# Patient Record
Sex: Female | Born: 1965
Health system: Southern US, Community
[De-identification: ages and names within clinical notes are randomized; demographics above are authoritative.]

## PROBLEM LIST (undated history)

## (undated) DIAGNOSIS — R87629 Unspecified abnormal cytological findings in specimens from vagina: Secondary | ICD-10-CM

## (undated) DIAGNOSIS — K219 Gastro-esophageal reflux disease without esophagitis: Secondary | ICD-10-CM

## (undated) DIAGNOSIS — J45909 Unspecified asthma, uncomplicated: Secondary | ICD-10-CM

## (undated) DIAGNOSIS — D649 Anemia, unspecified: Secondary | ICD-10-CM

## (undated) DIAGNOSIS — F419 Anxiety disorder, unspecified: Secondary | ICD-10-CM

## (undated) DIAGNOSIS — F99 Mental disorder, not otherwise specified: Secondary | ICD-10-CM

## (undated) DIAGNOSIS — D219 Benign neoplasm of connective and other soft tissue, unspecified: Secondary | ICD-10-CM

## (undated) HISTORY — DX: Mental disorder, not otherwise specified: F99

## (undated) HISTORY — DX: Anxiety disorder, unspecified: F41.9

## (undated) HISTORY — PX: TUBAL LIGATION: SHX77

## (undated) HISTORY — PX: BLADDER SUSPENSION: SHX72

## (undated) HISTORY — DX: Benign neoplasm of connective and other soft tissue, unspecified: D21.9

## (undated) HISTORY — DX: Unspecified abnormal cytological findings in specimens from vagina: R87.629

## (undated) HISTORY — PX: DILATION AND CURETTAGE OF UTERUS: SHX78

## (undated) HISTORY — PX: CHOLECYSTECTOMY: SHX55

## (undated) HISTORY — PX: TONSILLECTOMY AND ADENOIDECTOMY: SUR1326

## (undated) HISTORY — DX: Anemia, unspecified: D64.9

## (undated) HISTORY — PX: OTHER SURGICAL HISTORY: SHX169

## (undated) HISTORY — PX: WISDOM TOOTH EXTRACTION: SHX21

---

## 2009-11-19 ENCOUNTER — Emergency Department (HOSPITAL_BASED_OUTPATIENT_CLINIC_OR_DEPARTMENT_OTHER): Admission: EM | Admit: 2009-11-19 | Discharge: 2009-11-20 | Payer: Self-pay | Admitting: Emergency Medicine

## 2010-12-24 LAB — URINALYSIS, ROUTINE W REFLEX MICROSCOPIC
Glucose, UA: NEGATIVE mg/dL
Nitrite: NEGATIVE
pH: 6 (ref 5.0–8.0)

## 2011-06-11 ENCOUNTER — Emergency Department (HOSPITAL_BASED_OUTPATIENT_CLINIC_OR_DEPARTMENT_OTHER)
Admission: EM | Admit: 2011-06-11 | Discharge: 2011-06-12 | Disposition: A | Discharge status: Home / Self Care | Payer: Managed Care, Other (non HMO) | Attending: Emergency Medicine | Admitting: Emergency Medicine

## 2011-06-11 ENCOUNTER — Emergency Department (INDEPENDENT_AMBULATORY_CARE_PROVIDER_SITE_OTHER): Payer: Managed Care, Other (non HMO)

## 2011-06-11 ENCOUNTER — Encounter: Payer: Self-pay | Admitting: Emergency Medicine

## 2011-06-11 DIAGNOSIS — IMO0002 Reserved for concepts with insufficient information to code with codable children: Secondary | ICD-10-CM

## 2011-06-11 DIAGNOSIS — S6990XA Unspecified injury of unspecified wrist, hand and finger(s), initial encounter: Secondary | ICD-10-CM

## 2011-06-11 DIAGNOSIS — S6710XA Crushing injury of unspecified finger(s), initial encounter: Secondary | ICD-10-CM | POA: Insufficient documentation

## 2011-06-11 DIAGNOSIS — S61209A Unspecified open wound of unspecified finger without damage to nail, initial encounter: Secondary | ICD-10-CM

## 2011-06-11 DIAGNOSIS — W230XXA Caught, crushed, jammed, or pinched between moving objects, initial encounter: Secondary | ICD-10-CM | POA: Insufficient documentation

## 2011-06-11 NOTE — ED Notes (Signed)
Pt slammed right middle finger in car door

## 2011-06-12 MED ORDER — DOXYCYCLINE HYCLATE 100 MG PO CAPS: 100.0000 mg | ORAL_CAPSULE | Freq: Two times a day (BID) | ORAL | Status: DC

## 2011-06-12 MED ORDER — DOXYCYCLINE HYCLATE 100 MG PO CAPS: 100.0000 mg | ORAL_CAPSULE | Freq: Two times a day (BID) | ORAL | Status: AC

## 2011-06-12 NOTE — ED Provider Notes (Signed)
History     CSN: [id number] Arrival date & time: 06/11/2011 10:58 PM  Chief Complaint  Patient presents with  . Finger Injury   Patient is a 45 y.o. female presenting with hand pain. The history is provided by the patient. No language interpreter was used.  Hand Pain This is a new problem. The current episode started 1 to 2 hours ago. The problem has not changed since onset.Pertinent negatives include no chest pain, no abdominal pain, no headaches and no shortness of breath. Exacerbated by: slammed right middle finger in the car door. She has tried nothing for the symptoms. The treatment provided no relief.    History reviewed. No pertinent past medical history.  Past Surgical History  Procedure Date  . Cholecystectomy   . Bladder suspension   . Tonsillectomy and adenoidectomy     No family history on file.  History  Substance Use Topics  . Smoking status: Never Smoker   . Smokeless tobacco: Not on file  . Alcohol Use: No    OB History    Grav Para Term Preterm Abortions TAB SAB Ect Mult Living                  Review of Systems  Constitutional: Negative for activity change.  HENT: Negative for facial swelling.   Eyes: Negative for discharge.  Respiratory: Negative for shortness of breath.   Cardiovascular: Negative for chest pain.  Gastrointestinal: Negative for abdominal pain and abdominal distention.  Genitourinary: Negative for difficulty urinating.  Musculoskeletal: Positive for arthralgias.  Skin: Negative.   Neurological: Negative for headaches.  Hematological: Negative.   Psychiatric/Behavioral: Negative.     Physical Exam  BP 112/74  Pulse 100  Temp(Src) 98.2 F (36.8 C) (Oral)  Resp 18  SpO2 99%  Physical Exam  Constitutional: She is oriented to person, place, and time. She appears well-developed and well-nourished.  HENT:  Head: Normocephalic and atraumatic.  Eyes: EOM are normal. Pupils are equal, round, and reactive to light.  Neck: Normal  range of motion. Neck supple.  Cardiovascular: Normal rate and regular rhythm.   Pulmonary/Chest: Effort normal and breath sounds normal.  Abdominal: Soft. Bowel sounds are normal. She exhibits no distension.  Musculoskeletal: Normal range of motion.       Right middle finger cap refill < 2 sec superficial horizontal laceration lateral to the fingernail  Neurological: She is alert and oriented to person, place, and time.  Skin: Skin is warm and dry.  Psychiatric: She has a normal mood and affect.    ED Course  Procedures  MDM  Take all antibiotics, follow up with hand surgery.  Patient verbalizes understanding and agrees to follow up     Murrel Freet Smitty Cords, MD 06/12/11 [id number]

## 2012-01-08 ENCOUNTER — Emergency Department
Admission: EM | Admit: 2012-01-08 | Discharge: 2012-01-08 | Disposition: A | Discharge status: Home / Self Care | Payer: Managed Care, Other (non HMO) | Source: Home / Self Care | Attending: Family Medicine | Admitting: Family Medicine

## 2012-01-08 DIAGNOSIS — J0100 Acute maxillary sinusitis, unspecified: Secondary | ICD-10-CM

## 2012-01-08 DIAGNOSIS — M940 Chondrocostal junction syndrome [Tietze]: Secondary | ICD-10-CM

## 2012-01-08 DIAGNOSIS — H04309 Unspecified dacryocystitis of unspecified lacrimal passage: Secondary | ICD-10-CM

## 2012-01-08 DIAGNOSIS — J069 Acute upper respiratory infection, unspecified: Secondary | ICD-10-CM

## 2012-01-08 MED ORDER — BENZONATATE 200 MG PO CAPS: 200.0000 mg | ORAL_CAPSULE | Freq: Every day | ORAL | Status: AC

## 2012-01-08 MED ORDER — TOBRAMYCIN 0.3 % OP SOLN: 1.0000 [drp] | OPHTHALMIC | Status: AC

## 2012-01-08 MED ORDER — AMOXICILLIN 875 MG PO TABS: 875.0000 mg | ORAL_TABLET | Freq: Two times a day (BID) | ORAL | Status: AC

## 2012-01-08 NOTE — Discharge Instructions (Signed)
Take Mucinex D (guaifenesin with decongestant) twice daily for congestion.  Increase fluid intake, rest. May use Afrin nasal spray (or generic oxymetazoline) twice daily for about 5 days.  Also recommend using saline nasal spray several times daily and saline nasal irrigation (AYR is a common brand) Stop all antihistamines for now, and other non-prescription cough/cold preparations. May take Ibuprofen 200mg , 4 tabs every 8 hours with food for chest/sternum discomfort.  Dacryocystitis  Dacryocystitis is an infection of the tear sac. The tear sac lies between the inner corner of the eyelids and the nose. The glands of the eyelids always produce tears. This is to keep the surface of the eye wet and protect it. These tears drain from the surface of the eyes through a duct in each lid (lacrimal ducts), then through the tear sac (lacrimal sac) into the nose. The tears are then swallowed. If the tear ducts become blocked, bacteria begin to buildup. The tear sac can become infected. Dacryocystitis may be sudden (acute) or long-lasting (chronic). This problem is most common in infants because the tear ducts are not fully developed and clog easily. In that case, babies may have episodes of tearing and infection. However, in most cases, the problem gets better as the child grows. CAUSES   Malformation of the tear duct.   Injury.   Eye infection.   Trauma.   Injury or inflammation of the nasal passages.  The cause is often unknown. SYMPTOMS   Usually only 1 eye is involved.   Tearing and discharge from the involved eye.   Tenderness, redness and swelling of the lower lid near the nose.   A sore, red, inflamed bump on the inner corner of the lower lid.  DIAGNOSIS  A diagnosis is made by:  An exam of the eye to find out how much blockage is present and if the surface of the eye is also infected.   Getting cultures to see if a specific infection is present.  TREATMENT  Treatment depends on:    The person's age.   Whether or not the infection is chronic or acute.   Amount of blockage is present.  Additional treatment  In infants, sometimes massaging the area (starting from the inside of the eye and gently massaging down toward the nose) will improve the condition, combined with antibiotic eye drops or ointments.   If the above does not work, it may be necessary to probe the ducts and open up the drainage system. While this is easily done in the office in adults, probing usually has to be done under general anesthesia in infants.   If the blockage cannot be cleared by probing, surgery may be needed under general anesthesia to create a direct opening for tears to flow between the tear sac and the inside of the nose (dacryocystorhinostomy or DCR).  HOME CARE INSTRUCTIONS   Antibiotic drops or ointment may be prescribed after probing procedures.   If surgery has been performed, oral antibiotics may also be prescribed.   Finish all medications as prescribed, even if your symptoms get better.  SEEK MEDICAL CARE IF:  You develop tearing of 1 or both eyes and a sore, red, swollen lump between your lower eyelid and nose. This is especially true if you have these symptoms and a fever. SEEK IMMEDIATE MEDICAL CARE IF:   There is increased pain, swelling, redness or drainage from the eye.   There are signs of generalized infection including muscle aches, chills, fever or a general ill  feeling.   You or your child has an oral temperature above 102 F (38.9 C), not controlled by medicine.   Your baby is older than 3 months with a rectal temperature of 102 F (38.9 C) or higher.   Your baby is 64 months old or younger with a rectal temperature of 100.4 F (38 C) or higher.  MAKE SURE YOU:   Understand these instructions.   Will watch your condition.   Will get help right away if you are not doing well or get worse.  Document Released: 09/17/2000 Document Revised: 09/09/2011  Document Reviewed: 11/09/2007 [facility] Patient Information 2012 [place], [place].

## 2012-01-08 NOTE — ED Provider Notes (Signed)
History     CSN: [id number]  Arrival date & time 01/08/12  1144   First MD Initiated Contact with Patient 01/08/12 1232      Chief Complaint  Patient presents with  . Sore Throat      HPI Comments: Patient complains of onset of a mild sore throat about two weeks ago.  Then one week ago she developed sinus congestion.  About 4 days ago she developed a productive cough.  Yesterday she developed drainage from her left eye and increased left facial pressure.  The history is provided by the patient.    History reviewed. No pertinent past medical history.  Past Surgical History  Procedure Date  . Cholecystectomy   . Bladder suspension   . Tonsillectomy and adenoidectomy     Family History  Problem Relation Age of Onset  . Cancer Mother     History  Substance Use Topics  . Smoking status: Never Smoker   . Smokeless tobacco: Not on file  . Alcohol Use: No    OB History    Grav Para Term Preterm Abortions TAB SAB Ect Mult Living                  Review of Systems + sore throat + cough No pleuritic pain No wheezing + nasal congestion + post-nasal drainage + sinus pain/pressure + left irritated eye No earache No hemoptysis No SOB No  fever/chills No nausea No vomiting No abdominal pain No diarrhea No urinary symptoms No skin rashes + fatigue No myalgias No headache Used OTC meds without relief (Sudafed) Allergies  Codeine and Sulfa antibiotics  Home Medications   Current Outpatient Rx  Name Route Sig Dispense Refill  . AMOXICILLIN 875 MG PO TABS Oral Take 1 tablet (875 mg total) by mouth 2 (two) times daily. 20 tablet 0  . BENZONATATE 200 MG PO CAPS Oral Take 1 capsule (200 mg total) by mouth at bedtime. Take as needed for cough 12 capsule 0  . CLONAZEPAM 1 MG PO TABS Oral Take 1 mg by mouth 2 (two) times daily.      Marland Kitchen ONE-DAILY MULTI VITAMINS PO TABS Oral Take 1 tablet by mouth daily.      . SERTRALINE HCL 100 MG PO TABS Oral Take 100 mg by mouth daily.      . TOBRAMYCIN SULFATE 0.3 % OP SOLN Left Eye Place 1 drop into the left eye every 4 (four) hours. 5 mL 0    BP 107/73  Pulse 89  Temp(Src) 99 F (37.2 C) (Oral)  Resp 18  Ht 5\' 3"  (1.6 m)  Wt 179 lb 8 oz (81.421 kg)  BMI 31.80 kg/m2  Physical Exam Nursing notes and Vital Signs reviewed. Appearance:  Patient appears healthy, stated age, and in no acute distress Eyes:  Pupils are equal, round, and reactive to light and accomodation.  Extraocular movement is intact.  Right conjunctivae normal.  Left conjunctivae slightly injected.  There is mild tenderness beneath the left nasolacrimal duct.  Note small amount of mucopurulent drainage from left nasolacrimal duct. Ears:  Canals normal.  Right tympanic membrane scarred.  Left tympanic membrane is scarred with T-tube in place (no drainage from tube) Nose:  Mildly congested turbinates.   Maxillary sinus tenderness is present on the left.  Pharynx:  Normal Neck:  Supple.  Tender shotty anterior/posterior nodes are palpated bilaterally  Lungs:  Clear to auscultation.  Breath sounds are equal.  Chest:  Distinct tenderness to palpation over the mid-sternum.  Heart:  Regular rate and rhythm without murmurs,  rubs, or gallops.  Abdomen:  Nontender without masses or hepatosplenomegaly.  Bowel sounds are present.  No CVA or flank tenderness.  Extremities:  No edema.  No calf tenderness Skin:  No rash present.   ED Course  Procedures  none   Labs Reviewed  POCT RAPID STREP A (OFFICE) negative  STREP A DNA PROBE pending      1. Acute maxillary sinusitis   2. Dacrocystitis   3. Acute upper respiratory infections of  unspecified site   4. Costochondritis, acute       MDM  Begin amoxicillin for 10 days.  Tobrex eye drops to left eye.  Prescription written for Benzonatate [facility]) to take at bedtime for night-time cough.  Take Mucinex D (guaifenesin with decongestant) twice daily for congestion.  Increase fluid intake, rest. May use Afrin nasal spray (or generic oxymetazoline) twice daily for about 5 days.  Also recommend using saline nasal spray several times daily and saline nasal irrigation (AYR is a common brand) Stop all antihistamines for now, and other non-prescription cough/cold preparations. May take Ibuprofen 200mg , 4 tabs every 8 hours with food for chest/sternum discomfort. Followup with Family Doctor if not improved in about 5 days.        Lattie Haw, MD 01/08/12 [phone]

## 2012-01-08 NOTE — ED Notes (Signed)
Sore throat x 2 weeks, left eye drainage started last weekend and noted drainage on friday

## 2012-01-09 LAB — STREP A DNA PROBE: GASP: NEGATIVE

## 2012-01-10 ENCOUNTER — Telehealth: Payer: Self-pay | Admitting: Family Medicine

## 2012-03-15 ENCOUNTER — Ambulatory Visit (INDEPENDENT_AMBULATORY_CARE_PROVIDER_SITE_OTHER): Payer: Managed Care, Other (non HMO) | Admitting: Physician Assistant

## 2012-03-15 ENCOUNTER — Encounter: Payer: Self-pay | Admitting: Physician Assistant

## 2012-03-15 VITALS — BP 126/84 | HR 91 | Ht 63.0 in | Wt 179.0 lb

## 2012-03-15 DIAGNOSIS — R609 Edema, unspecified: Secondary | ICD-10-CM

## 2012-03-15 DIAGNOSIS — K219 Gastro-esophageal reflux disease without esophagitis: Secondary | ICD-10-CM | POA: Insufficient documentation

## 2012-03-15 DIAGNOSIS — F410 Panic disorder [episodic paroxysmal anxiety] without agoraphobia: Secondary | ICD-10-CM | POA: Insufficient documentation

## 2012-03-15 DIAGNOSIS — Z79899 Other long term (current) drug therapy: Secondary | ICD-10-CM

## 2012-03-15 DIAGNOSIS — M25473 Effusion, unspecified ankle: Secondary | ICD-10-CM

## 2012-03-15 DIAGNOSIS — F419 Anxiety disorder, unspecified: Secondary | ICD-10-CM | POA: Insufficient documentation

## 2012-03-15 DIAGNOSIS — F411 Generalized anxiety disorder: Secondary | ICD-10-CM

## 2012-03-15 MED ORDER — ESOMEPRAZOLE MAGNESIUM 40 MG PO PACK: 40.0000 mg | PACK | Freq: Every day | ORAL | Status: DC

## 2012-03-15 MED ORDER — HYDROCHLOROTHIAZIDE 12.5 MG PO CAPS: ORAL_CAPSULE | ORAL | Status: DC

## 2012-03-15 MED ORDER — SERTRALINE HCL 50 MG PO TABS: 50.0000 mg | ORAL_TABLET | Freq: Every day | ORAL | Status: DC

## 2012-03-15 NOTE — Progress Notes (Signed)
  Subjective:    Patient ID: Shirley Vasquez, female    DOB: 04-17-1966, 46 y.o.   MRN: [id number]  HPI Patient presents to the clinic to establish care and discuss bilateral ankle edema. PMH was reviewed and positive for GERD, anxiety, panic attacks. She is not currently on anything for GERD but has been on Nexium in the past. She wants to go back on it. In the past she has even had to have esophageal dilation. For anxiety she takes Zoloft 100mg  which has helped. Recently she had been having more panic attacks and went to urgent care they have her klonapin. That has really helped to put her at ease. On average she uses klonapin 2-3 times a week.   Recently, last 2 weeks, she has noticed bilateral edema in both ankles. It gets worse when she is on them all day. Swelling goes away with rest. She denies any pain but some tightness when they are really swollen. Not tried anything to make better.    Review of Systems     Objective:   Physical Exam  Constitutional: She is oriented to person, place, and time. She appears well-developed and well-nourished.       Overweight.  HENT:  Head: Normocephalic and atraumatic.  Neck: Normal range of motion. Neck supple. No thyromegaly present.  Cardiovascular: Normal rate, regular rhythm and normal heart sounds.   Pulmonary/Chest: Effort normal and breath sounds normal. She has no wheezes.  Neurological: She is alert and oriented to person, place, and time.  Skin: Skin is warm and dry.       Mild swelling 1+ pitting edema around both ankles. No skin changes, no ulceration or decomposition. Pedal pulses are 2+ bilaterally. No varicose veins.   Psychiatric: She has a normal mood and affect. Her behavior is normal.          Assessment & Plan:  GERD- Sent rx for nexium 40mg  daily to restart.   Anxiety/Panic attacks- GAD-7 was 6. Added a 50mg  tab of zoloft to 100mg  she is already taking. Will recheck in 6 weeks. Continue using Klonapin as needed for  attacks.   Bilateral ankle edema- Suspect a mild version of venous insuffiency. Gave symptomatic care ideas : frequent rest and feet elevation when going to be on them all day, get compression stockings to wear, Watch salt intake. Will check kidneys but did give HCTZ 12.5 to take only when needed for swelling. Reassured her the cause of swelling and that most likey the summer would make worse. Will recheck in 6 weeks. Will also check a TSH to make sure that is not contributing to the swelling.   Needs labs will get labs when schedule CPE.

## 2012-03-15 NOTE — Patient Instructions (Addendum)
Will get Tsh and CMP done today. Will call with results in 1-3 days. Will give HCTZ to take as needed for swelling of ankles. Gave Nexium for acid reflux. Increased Zoloft to 150mg  daily daily. Will recheck in 6 weeks for CPE.   Venous Stasis and Chronic Venous Insufficiency As people age, the veins located in their legs may weaken and stretch. When veins weaken and lose the ability to pump blood effectively, the condition is called chronic venous insufficiency (CVI) or venous stasis. Almost all veins return blood back to the heart. This happens by:  The force of the heart pumping fresh blood pushes blood back to the heart.   Blood flowing to the heart from the force of gravity.  In the deep veins of the legs, blood has to fight gravity and flow upstream back to the heart. Here, the leg muscles contract to pump blood back toward the heart. Vein walls are elastic, and many veins have small valves that only allow blood to flow in one direction. When leg muscles contract, they push inward against the elastic vein walls. This squeezes blood upward, opens the valves, and moves blood toward the heart. When leg muscles relax, the vein wall also relaxes and the valves inside the vein close to prevent blood from flowing backward. This method of pumping blood out of the legs is called the venous pump. CAUSES  The venous pump works best while walking and leg muscles are contracting. But when a person sits or stands, blood pressure in leg veins can build. Deep veins are usually able to withstand short periods of inactivity, but long periods of inactivity (and increased pressure) can stretch, weaken, and damage vein walls. High blood pressure can also stretch and damage vein walls. The veins may no longer be able to pump blood back to the heart. Venous hypertension (high blood pressure inside veins) that lasts over time is a primary cause of CVI. CVI can also be caused by:   Deep vein thrombosis, a condition where a  thrombus (blood clot) blocks blood flow in a vein.   Phlebitis, an inflammation of a superficial vein that causes a blood clot to form.  Other risk factors for CVI may include:   Heredity.   Obesity.   Pregnancy.   Sedentary lifestyle.   Smoking.   Jobs requiring long periods of standing or sitting in one place.   Age and gender:   Women in their 75's and 84's and men in their 39's are more prone to developing CVI.  SYMPTOMS  Symptoms of CVI may include:   Varicose veins.   Ulceration or skin breakdown.   Lipodermatosclerosis, a condition that affects the skin just above the ankle, usually on the inside surface. Over time the skin becomes brown, smooth, tight and often painful. Those with this condition have a high risk of developing skin ulcers.   Reddened or discolored skin on the leg.   Swelling.  DIAGNOSIS  Your caregiver can diagnose CVI after performing a careful medical history and physical examination. To confirm the diagnosis, the following tests may also be ordered:   Duplex ultrasound.   Plethysmography (tests blood flow).   Venograms (x-ray using a special dye).  TREATMENT The goals of treatment for CVI are to restore a person to an active life and to minimize pain or disability. Typically, CVI does not pose a serious threat to life or limb, and with proper treatment most people with this condition can continue to lead active  lives. In most cases, mild CVI can be treated on an outpatient basis with simple procedures. Treatment methods include:   Elastic compression socks.   Sclerotherapy, a procedure involving an injection of a material that "dissolves" the damaged veins. Other veins in the network of blood vessels take over the function of the damaged veins.   Vein stripping (an older procedure less commonly used).   Laser Ablation surgery.   Valve repair.  HOME CARE INSTRUCTIONS   Elastic compression socks must be worn every day. They can help with  symptoms and lower the chances of the problem getting worse, but they do not cure the problem.   Only take over-the-counter or prescription medicines for pain, discomfort, or fever as directed by your caregiver.   Your caregiver will review your other medications with you.  SEEK MEDICAL CARE IF:   You are confused about how to take your medications.   There is redness, swelling, or increasing pain in the affected area.   There is a red streak or line that extends up or down from the affected area.   There is a breakdown or loss of skin in the affected area, even if the breakdown is small.   You develop an unexplained oral temperature above 102 F (38.9 C).   There is an injury to the affected area.  SEEK IMMEDIATE MEDICAL CARE IF:   There is an injury and open wound to the affected area.   Pain is not adequately relieved with pain medication prescribed or becomes severe.   An oral temperature above 102 F (38.9 C) develops.   The foot/ankle below the affected area becomes suddenly numb or the area feels weak and hard to move.  MAKE SURE YOU:   Understand these instructions.   Will watch your condition.   Will get help right away if you are not doing well or get worse.  Document Released: 01/24/2007 Document Revised: 09/09/2011 Document Reviewed: 04/03/2007 [facility] Patient Information 2012 [place], [place].

## 2012-03-16 LAB — COMPLETE METABOLIC PANEL WITH GFR
AST: 16 U/L (ref 0–37)
Albumin: 4.4 g/dL (ref 3.5–5.2)
Alkaline Phosphatase: 53 U/L (ref 39–117)
BUN: 10 mg/dL (ref 6–23)
GFR, Est Non African American: 79 mL/min
Potassium: 4.4 mEq/L (ref 3.5–5.3)
Sodium: 136 mEq/L (ref 135–145)
Total Bilirubin: 0.6 mg/dL (ref 0.3–1.2)
Total Protein: 7 g/dL (ref 6.0–8.3)

## 2012-03-16 LAB — TSH: TSH: 2.265 u[IU]/mL (ref 0.350–4.500)

## 2012-03-17 ENCOUNTER — Telehealth: Payer: Self-pay | Admitting: *Deleted

## 2012-03-17 ENCOUNTER — Other Ambulatory Visit: Payer: Self-pay | Admitting: *Deleted

## 2012-03-17 MED ORDER — AMBULATORY NON FORMULARY MEDICATION: Status: DC

## 2012-03-17 NOTE — Telephone Encounter (Signed)
Pt is stating that around her rt ankle states it looks a little bruised and states her veins look more predominent. States it doesn't hurt. Please advise.

## 2012-03-17 NOTE — Telephone Encounter (Signed)
Accessed pt's legs while her to pickup rx for compression stockings. Pt has mild non-pitting edema. Encouraged pt to wear compression stockings and keep legs elevated as much as possible and low sodium diet. Coloration is mild pinkness around ankle and calf.

## 2012-03-17 NOTE — Telephone Encounter (Signed)
REcommend start the compression stocking, low salt diet.  Keep feet elevated.

## 2012-04-26 ENCOUNTER — Encounter: Payer: Managed Care, Other (non HMO) | Admitting: Physician Assistant

## 2012-06-09 ENCOUNTER — Ambulatory Visit (INDEPENDENT_AMBULATORY_CARE_PROVIDER_SITE_OTHER): Payer: Managed Care, Other (non HMO) | Admitting: Physician Assistant

## 2012-06-09 ENCOUNTER — Encounter: Payer: Self-pay | Admitting: Physician Assistant

## 2012-06-09 VITALS — BP 120/70 | HR 78 | Ht 63.0 in | Wt 177.0 lb

## 2012-06-09 DIAGNOSIS — H9202 Otalgia, left ear: Secondary | ICD-10-CM

## 2012-06-09 DIAGNOSIS — F410 Panic disorder [episodic paroxysmal anxiety] without agoraphobia: Secondary | ICD-10-CM

## 2012-06-09 DIAGNOSIS — Z13220 Encounter for screening for lipoid disorders: Secondary | ICD-10-CM

## 2012-06-09 DIAGNOSIS — H6990 Unspecified Eustachian tube disorder, unspecified ear: Secondary | ICD-10-CM

## 2012-06-09 DIAGNOSIS — Z0000 Encounter for general adult medical examination without abnormal findings: Secondary | ICD-10-CM

## 2012-06-09 DIAGNOSIS — F419 Anxiety disorder, unspecified: Secondary | ICD-10-CM

## 2012-06-09 LAB — LIPID PANEL
HDL: 52 mg/dL (ref >39)
Total CHOL/HDL Ratio: 4.2 Ratio
Triglycerides: 142 mg/dL (ref <150)

## 2012-06-09 MED ORDER — SERTRALINE HCL 100 MG PO TABS: ORAL_TABLET | ORAL | Status: DC

## 2012-06-09 MED ORDER — CLONAZEPAM 1 MG PO TABS: 1.0000 mg | ORAL_TABLET | Freq: Two times a day (BID) | ORAL | Status: DC | PRN

## 2012-06-09 NOTE — Addendum Note (Signed)
Addended by: Jomarie Longs on: 06/09/2012 02:06 PM   Modules accepted: Orders

## 2012-06-09 NOTE — Progress Notes (Addendum)
Subjective:     Shirley Vasquez is a 46 y.o. female and is here for a comprehensive physical exam. The patient reports no problems.  Only takes klonapin during panic attacks and lately has only been during thunderstorms. Increased Zoloft has decreased anxiety significantly. She was able to drop her daughter off and college and not break down.   Lower extremity edema- ongoing problem controlled with rest, elevation of feet at night and compression stockings. Not able to take HCTZ due to allergic reaction with Hives.   ETD- Being managed by [facility]. She feels like more could be done. Tube in left ear taken out and since having a lot of discomfort. Denies any discharge. Would like another referral.  History   Social History  . Marital Status: Married    Spouse Name: N/A    Number of Children: N/A  . Years of Education: N/A   Occupational History  . Not on file.   Social History Main Topics  . Smoking status: Never Smoker   . Smokeless tobacco: Not on file  . Alcohol Use: No  . Drug Use: No  . Sexually Active:    Other Topics Concern  . Not on file   Social History Narrative  . No narrative on file   Health Maintenance  Topic Date Due  . Influenza Vaccine  07/04/2012  . Mammogram  09/14/2012  . Tetanus/tdap  11/04/2013  . Pap Smear  10/04/2014    The following portions of the patient's history were reviewed and updated as appropriate: allergies, current medications, past family history, past medical history, past social history, past surgical history and problem list.  Review of Systems A comprehensive review of systems was negative.   Objective:    BP 120/70  Pulse 78  Ht 5\' 3"  (1.6 m)  Wt 177 lb (80.287 kg)  BMI 31.35 kg/m2  SpO2 97% General appearance: alert, cooperative and appears stated age Head: Normocephalic, without obvious abnormality, atraumatic Eyes: conjunctivae/corneas clear. PERRL, EOM's intact. Fundi benign. Ears: normal TM's and external ear  canals both ears Nose: Nares normal. Septum midline. Mucosa normal. No drainage or sinus tenderness. Throat: lips, mucosa, and tongue normal; teeth and gums normal Neck: no adenopathy, no carotid bruit, no JVD, supple, symmetrical, trachea midline and thyroid not enlarged, symmetric, no tenderness/mass/nodules Back: symmetric, no curvature. ROM normal. No CVA tenderness. Lungs: clear to auscultation bilaterally Breasts: normal appearance, no masses or tenderness Heart: regular rate and rhythm, S1, S2 normal, no murmur, click, rub or gallop Abdomen: soft, non-tender; bowel sounds normal; no masses,  no organomegaly Extremities: extremities normal, atraumatic, no cyanosis or edema Pulses: 2+ and symmetric Skin: Skin color, texture, turgor normal. No rashes or lesions Lymph nodes: Cervical, supraclavicular, and axillary nodes normal. Neurologic: Grossly normal    Assessment:    Healthy female exam.      Plan:    CPE/panic attacks/anxiety-Sent to lab to have fasting labs for cholesterol drawn. CMP from 2 months ago was wonderful. Vaccines up to date. Sent in refill for increased Zoloft. She feels much more controlled on 150mg  a day. Will f/u in 6 months. Refilled klonapin for panic attacks. REcommended calcium 500mg  twice a day or 4 servings of dairy daily along with Vit D 800 IU with a multivitamin. Encouraged regular exercise and a balanced diet. Pap up to date. Mammogram is due for December. Patient reports swelling to be much better in her legs. Compression stockings have helped. Reminder to get a flu shot in next  month.  ETD- New referral put in. Reassured pt that there is no infection today and ears look good. There is a hole present. Reminded patient not to let standing water in ear.      See After Visit Summary for Counseling Recommendations

## 2012-06-09 NOTE — Patient Instructions (Addendum)
Recommend flu shot. Calcium 500mg  twice a day or 4 servings of dairy. Vitamin D 800 international Units.

## 2012-06-12 ENCOUNTER — Encounter: Payer: Self-pay | Admitting: Physician Assistant

## 2012-06-12 DIAGNOSIS — E785 Hyperlipidemia, unspecified: Secondary | ICD-10-CM | POA: Insufficient documentation

## 2012-07-03 ENCOUNTER — Emergency Department
Admission: EM | Admit: 2012-07-03 | Discharge: 2012-07-03 | Disposition: A | Discharge status: Home / Self Care | Payer: Managed Care, Other (non HMO) | Source: Home / Self Care

## 2012-07-03 ENCOUNTER — Encounter: Payer: Self-pay | Admitting: *Deleted

## 2012-07-03 DIAGNOSIS — B349 Viral infection, unspecified: Secondary | ICD-10-CM

## 2012-07-03 DIAGNOSIS — B9789 Other viral agents as the cause of diseases classified elsewhere: Secondary | ICD-10-CM

## 2012-07-03 LAB — POCT MONO SCREEN (KUC): Mono, POC: NEGATIVE

## 2012-07-03 MED ORDER — AZITHROMYCIN 250 MG PO TABS: ORAL_TABLET | ORAL | Status: DC

## 2012-07-03 NOTE — ED Notes (Signed)
Pt c/o cough, nasal congestion, and sneezing x 1wk. She also c/o diarrhea and body aches x 4 days. She has taken Imodium, Claritin D, Flonase and Tylenol. Denies fever.

## 2012-07-03 NOTE — ED Provider Notes (Signed)
History     CSN: [id number]  Arrival date & time 07/03/12  1123   None     Chief Complaint  Patient presents with  . Cough  . Diarrhea  . Nasal Congestion   HPI URI Symptoms Onset: 8 days  Description: rhinorrhea, nasal congestion, cough, body aches, diarrhea  Modifying factors:  Sick contacts with similar sxs. Sxs are mildly improving but still lingering.   Symptoms Nasal discharge: yes Fever: no Sore throat: mild Cough: yes Wheezing: no Ear pain: no GI symptoms: nausea and diarrhea (NBN[facility]) Sick contacts: yes  Red Flags  Stiff neck: no Dyspnea: no Rash: no Swallowing difficulty: no  Sinusitis Risk Factors Headache/face pain: no Double sickening: no tooth pain: no  Allergy Risk Factors Sneezing: yes Itchy scratchy throat: yes Seasonal symptoms: yes  Flu Risk Factors Headache: yes muscle aches: yes severe fatigue: mild    Past Medical History  Diagnosis Date  . Anxiety   . Fibroid     Uterine ablation    Past Surgical History  Procedure Date  . Cholecystectomy   . Bladder suspension   . Tonsillectomy and adenoidectomy   . Tubal ligation   . Uternine ablasion     Family History  Problem Relation Age of Onset  . Cancer Mother     History  Substance Use Topics  . Smoking status: Never Smoker   . Smokeless tobacco: Not on file  . Alcohol Use: No    OB History    Grav Para Term Preterm Abortions TAB SAB Ect Mult Living                  Review of Systems  All other systems reviewed and are negative.    Allergies  Amoxicillin; Codeine; Hctz; and Sulfa antibiotics  Home Medications   Current Outpatient Rx  Name Route Sig Dispense Refill  . FLUTICASONE PROPIONATE 50 MCG/ACT NA SUSP Nasal Place 2 sprays into the nose daily.    Marland Kitchen LORATADINE-PSEUDOEPHEDRINE ER 10-240 MG PO TB24 Oral Take 1 tablet by mouth daily.    . AMBULATORY NON FORMULARY MEDICATION  Medication Name: Compression stockings Diagnosis: Bilateral ankle edema   Diagnosis Code: 782.3 1 Package 0  . AZITHROMYCIN 250 MG PO TABS  Take 2 tabs PO x 1 dose, then 1 tab PO QD x 4 days 6 tablet 0  . CLONAZEPAM 1 MG PO TABS Oral Take 1 tablet (1 mg total) by mouth 2 (two) times daily as needed. 30 tablet 0  . ESOMEPRAZOLE MAGNESIUM 40 MG PO PACK Oral Take 40 mg by mouth daily before breakfast. 30 each 12  . ONE-DAILY MULTI VITAMINS PO TABS Oral Take 1 tablet by mouth daily.      . SERTRALINE HCL 100 MG PO TABS  Take 1 and 1/2 tab daily. 60 tablet 5    BP 126/83  Pulse 73  Temp 98.4 F (36.9 C) (Oral)  Resp 16  Ht 5\' 3"  (1.6 m)  Wt 179 lb (81.194 kg)  BMI 31.71 kg/m2  SpO2 99%  Physical Exam  Constitutional: She appears well-developed and well-nourished.  HENT:  Head: Normocephalic and atraumatic.  Right Ear: External ear normal.       L ear TM scarring s/p ET tube removal +nasal erythema, rhinorrhea bilaterally, + post oropharyngeal erythema    Eyes: Conjunctivae normal are normal. Pupils are equal, round, and reactive to light.  Neck: Normal range of motion. Neck supple.  Cardiovascular: Normal rate and normal heart sounds.  Pulmonary/Chest: Effort normal and breath sounds normal. No respiratory distress. She has no wheezes. She has no rales.  Abdominal: Soft. Bowel sounds are normal.       Mild lower abd tenderness   Musculoskeletal: Normal range of motion.  Lymphadenopathy:    She has cervical adenopathy.  Neurological: She is alert.  Skin: Skin is warm.    ED Course  Procedures (including critical care time)  Labs Reviewed - No data to display No results found.   1. Viral illness       MDM  Suspect likely viral illness Will check rapid flu and monospot.  Discussed suportive care and general red flags for reevaluation.  Zpak if sxs not improved in 3-5 days.  Follow up as needed.      The patient and/or caregiver has been counseled thoroughly with regard to treatment plan and/or medications prescribed including dosage,  schedule, interactions, rationale for use, and possible side effects and they verbalize understanding. Diagnoses and expected course of recovery discussed and will return if not improved as expected or if the condition worsens. Patient and/or caregiver verbalized understanding.             Doree Albee, MD 07/03/12 1215

## 2012-07-05 ENCOUNTER — Telehealth: Payer: Self-pay | Admitting: *Deleted

## 2012-10-11 ENCOUNTER — Encounter: Payer: Self-pay | Admitting: Physician Assistant

## 2012-10-11 ENCOUNTER — Ambulatory Visit (INDEPENDENT_AMBULATORY_CARE_PROVIDER_SITE_OTHER): Payer: Managed Care, Other (non HMO) | Admitting: Physician Assistant

## 2012-10-11 ENCOUNTER — Ambulatory Visit (INDEPENDENT_AMBULATORY_CARE_PROVIDER_SITE_OTHER): Payer: Managed Care, Other (non HMO)

## 2012-10-11 VITALS — BP 121/74 | HR 88 | Resp 18 | Wt 181.0 lb

## 2012-10-11 DIAGNOSIS — R209 Unspecified disturbances of skin sensation: Secondary | ICD-10-CM

## 2012-10-11 DIAGNOSIS — M7541 Impingement syndrome of right shoulder: Secondary | ICD-10-CM

## 2012-10-11 DIAGNOSIS — M25819 Other specified joint disorders, unspecified shoulder: Secondary | ICD-10-CM

## 2012-10-11 DIAGNOSIS — R200 Anesthesia of skin: Secondary | ICD-10-CM

## 2012-10-11 DIAGNOSIS — R29898 Other symptoms and signs involving the musculoskeletal system: Secondary | ICD-10-CM

## 2012-10-11 DIAGNOSIS — M25511 Pain in right shoulder: Secondary | ICD-10-CM

## 2012-10-11 DIAGNOSIS — M25519 Pain in unspecified shoulder: Secondary | ICD-10-CM

## 2012-10-11 DIAGNOSIS — M25571 Pain in right ankle and joints of right foot: Secondary | ICD-10-CM

## 2012-10-11 DIAGNOSIS — M25579 Pain in unspecified ankle and joints of unspecified foot: Secondary | ICD-10-CM

## 2012-10-11 DIAGNOSIS — M259 Joint disorder, unspecified: Secondary | ICD-10-CM

## 2012-10-11 MED ORDER — GABAPENTIN 100 MG PO CAPS: 100.0000 mg | ORAL_CAPSULE | Freq: Three times a day (TID) | ORAL | Status: DC

## 2012-10-11 MED ORDER — MELOXICAM 7.5 MG PO TABS: ORAL_TABLET | ORAL | Status: DC
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
## 2012-10-11 NOTE — Patient Instructions (Addendum)
Wear supportive shoes.   Impingement Syndrome, Rotator Cuff, Bursitis with Rehab Impingement syndrome is a condition that involves inflammation of the tendons of the rotator cuff and the subacromial bursa, that causes pain in the shoulder. The rotator cuff consists of four tendons and muscles that control much of the shoulder and upper arm function. The subacromial bursa is a fluid filled sac that helps reduce friction between the rotator cuff and one of the bones of the shoulder (acromion). Impingement syndrome is usually an overuse injury that causes swelling of the bursa (bursitis), swelling of the tendon (tendonitis), and/or a tear of the tendon (strain). Strains are classified into three categories. Grade 1 strains cause pain, but the tendon is not lengthened. Grade 2 strains include a lengthened ligament, due to the ligament being stretched or partially ruptured. With grade 2 strains there is still function, although the function may be decreased. Grade 3 strains include a complete tear of the tendon or muscle, and function is usually impaired. SYMPTOMS   Pain around the shoulder, often at the outer portion of the upper arm.  Pain that gets worse with shoulder function, especially when reaching overhead or lifting.  Sometimes, aching when not using the arm.  Pain that wakes you up at night.  Sometimes, tenderness, swelling, warmth, or redness over the affected area.  Loss of strength.  Limited motion of the shoulder, especially reaching behind the back (to the back pocket or to unhook bra) or across your body.  Crackling sound (crepitation) when moving the arm.  Biceps tendon pain and inflammation (in the front of the shoulder). Worse when bending the elbow or lifting. CAUSES  Impingement syndrome is often an overuse injury, in which chronic (repetitive) motions cause the tendons or bursa to become inflamed. A strain occurs when a force is paced on the tendon or muscle that is greater  than it can withstand. Common mechanisms of injury include: Stress from sudden increase in duration, frequency, or intensity of training.  Direct hit (trauma) to the shoulder.  Aging, erosion of the tendon with normal use.  Bony bump on shoulder (acromial spur). RISK INCREASES WITH:  Contact sports (football, wrestling, boxing).  Throwing sports (baseball, tennis, volleyball).  Weightlifting and bodybuilding.  Heavy labor.  Previous injury to the rotator cuff, including impingement.  Poor shoulder strength and flexibility.  Failure to warm up properly before activity.  Inadequate protective equipment.  Old age.  Bony bump on shoulder (acromial spur). PREVENTION   Warm up and stretch properly before activity.  Allow for adequate recovery between workouts.  Maintain physical fitness:  Strength, flexibility, and endurance.  Cardiovascular fitness.  Learn and use proper exercise technique. PROGNOSIS  If treated properly, impingement syndrome usually goes away within 6 weeks. Sometimes surgery is required.  RELATED COMPLICATIONS   Longer healing time if not properly treated, or if not given enough time to heal.  Recurring symptoms, that result in a chronic condition.  Shoulder stiffness, frozen shoulder, or loss of motion.  Rotator cuff tendon tear.  Recurring symptoms, especially if activity is resumed too soon, with overuse, with a direct blow, or when using poor technique. TREATMENT  Treatment first involves the use of ice and medicine, to reduce pain and inflammation. The use of strengthening and stretching exercises may help reduce pain with activity. These exercises may be performed at home or with a therapist. If non-surgical treatment is unsuccessful after more than 6 months, surgery may be advised. After surgery and rehabilitation, activity is  usually possible in 3 months.  MEDICATION  If pain medicine is needed, nonsteroidal anti-inflammatory medicines  (aspirin and ibuprofen), or other minor pain relievers (acetaminophen), are often advised.  Do not take pain medicine for 7 days before surgery.  Prescription pain relievers may be given, if your caregiver thinks they are needed. Use only as directed and only as much as you need.  Corticosteroid injections may be given by your caregiver. These injections should be reserved for the most serious cases, because they may only be given a certain number of times. HEAT AND COLD  Cold treatment (icing) should be applied for 10 to 15 minutes every 2 to 3 hours for inflammation and pain, and immediately after activity that aggravates your symptoms. Use ice packs or an ice massage.  Heat treatment may be used before performing stretching and strengthening activities prescribed by your caregiver, physical therapist, or athletic trainer. Use a heat pack or a warm water soak. SEEK MEDICAL CARE IF:   Symptoms get worse or do not improve in 4 to 6 weeks, despite treatment.  New, unexplained symptoms develop. (Drugs used in treatment may produce side effects.) EXERCISES  RANGE OF MOTION (ROM) AND STRETCHING EXERCISES - Impingement Syndrome (Rotator Cuff  Tendinitis, Bursitis) These exercises may help you when beginning to rehabilitate your injury. Your symptoms may go away with or without further involvement from your physician, physical therapist or athletic trainer. While completing these exercises, remember:   Restoring tissue flexibility helps normal motion to return to the joints. This allows healthier, less painful movement and activity.  An effective stretch should be held for at least 30 seconds.  A stretch should never be painful. You should only feel a gentle lengthening or release in the stretched tissue. STRETCH  Flexion, Standing  Stand with good posture. With an underhand grip on your right / left hand, and an overhand grip on the opposite hand, grasp a broomstick or cane so that your hands  are a little more than shoulder width apart.  Keeping your right / left elbow straight and shoulder muscles relaxed, push the stick with your opposite hand, to raise your right / left arm in front of your body and then overhead. Raise your arm until you feel a stretch in your right / left shoulder, but before you have increased shoulder pain.  Try to avoid shrugging your right / left shoulder as your arm rises, by keeping your shoulder blade tucked down and toward your mid-back spine. Hold for __________ seconds.  Slowly return to the starting position. Repeat __________ times. Complete this exercise __________ times per day. STRETCH  Abduction, Supine  Lie on your back. With an underhand grip on your right / left hand and an overhand grip on the opposite hand, grasp a broomstick or cane so that your hands are a little more than shoulder width apart.  Keeping your right / left elbow straight and your shoulder muscles relaxed, push the stick with your opposite hand, to raise your right / left arm out to the side of your body and then overhead. Raise your arm until you feel a stretch in your right / left shoulder, but before you have increased shoulder pain.  Try to avoid shrugging your right / left shoulder as your arm rises, by keeping your shoulder blade tucked down and toward your mid-back spine. Hold for __________ seconds.  Slowly return to the starting position. Repeat __________ times. Complete this exercise __________ times per day. ROM  Flexion,  Active-Assisted  Lie on your back. You may bend your knees for comfort.  Grasp a broomstick or cane so your hands are about shoulder width apart. Your right / left hand should grip the end of the stick, so that your hand is positioned "thumbs-up," as if you were about to shake hands.  Using your healthy arm to lead, raise your right / left arm overhead, until you feel a gentle stretch in your shoulder. Hold for __________ seconds.  Use the  stick to assist in returning your right / left arm to its starting position. Repeat __________ times. Complete this exercise __________ times per day.  ROM - Internal Rotation, Supine   Lie on your back on a firm surface. Place your right / left elbow about 60 degrees away from your side. Elevate your elbow with a folded towel, so that the elbow and shoulder are the same height.  Using a broomstick or cane and your strong arm, pull your right / left hand toward your body until you feel a gentle stretch, but no increase in your shoulder pain. Keep your shoulder and elbow in place throughout the exercise.  Hold for __________ seconds. Slowly return to the starting position. Repeat __________ times. Complete this exercise __________ times per day. STRETCH - Internal Rotation  Place your right / left hand behind your back, palm up.  Throw a towel or belt over your opposite shoulder. Grasp the towel with your right / left hand.  While keeping an upright posture, gently pull up on the towel, until you feel a stretch in the front of your right / left shoulder.  Avoid shrugging your right / left shoulder as your arm rises, by keeping your shoulder blade tucked down and toward your mid-back spine.  Hold for __________ seconds. Release the stretch, by lowering your healthy hand. Repeat __________ times. Complete this exercise __________ times per day. ROM - Internal Rotation   Using an underhand grip, grasp a stick behind your back with both hands.  While standing upright with good posture, slide the stick up your back until you feel a mild stretch in the front of your shoulder.  Hold for __________ seconds. Slowly return to your starting position. Repeat __________ times. Complete this exercise __________ times per day.  STRETCH  Posterior Shoulder Capsule   Stand or sit with good posture. Grasp your right / left elbow and draw it across your chest, keeping it at the same height as your  shoulder.  Pull your elbow, so your upper arm comes in closer to your chest. Pull until you feel a gentle stretch in the back of your shoulder.  Hold for __________ seconds. Repeat __________ times. Complete this exercise __________ times per day. STRENGTHENING EXERCISES - Impingement Syndrome (Rotator Cuff Tendinitis, Bursitis) These exercises may help you when beginning to rehabilitate your injury. They may resolve your symptoms with or without further involvement from your physician, physical therapist or athletic trainer. While completing these exercises, remember:  Muscles can gain both the endurance and the strength needed for everyday activities through controlled exercises.  Complete these exercises as instructed by your physician, physical therapist or athletic trainer. Increase the resistance and repetitions only as guided.  You may experience muscle soreness or fatigue, but the pain or discomfort you are trying to eliminate should never worsen during these exercises. If this pain does get worse, stop and make sure you are following the directions exactly. If the pain is still present after adjustments, discontinue  the exercise until you can discuss the trouble with your clinician.  During your recovery, avoid activity or exercises which involve actions that place your injured hand or elbow above your head or behind your back or head. These positions stress the tissues which you are trying to heal. STRENGTH - Scapular Depression and Adduction   With good posture, sit on a firm chair. Support your arms in front of you, with pillows, arm rests, or on a table top. Have your elbows in line with the sides of your body.  Gently draw your shoulder blades down and toward your mid-back spine. Gradually increase the tension, without tensing the muscles along the top of your shoulders and the back of your neck.  Hold for __________ seconds. Slowly release the tension and relax your muscles  completely before starting the next repetition.  After you have practiced this exercise, remove the arm support and complete the exercise in standing as well as sitting position. Repeat __________ times. Complete this exercise __________ times per day.  STRENGTH - Shoulder Abductors, Isometric  With good posture, stand or sit about 4-6 inches from a wall, with your right / left side facing the wall.  Bend your right / left elbow. Gently press your right / left elbow into the wall. Increase the pressure gradually, until you are pressing as hard as you can, without shrugging your shoulder or increasing any shoulder discomfort.  Hold for __________ seconds.  Release the tension slowly. Relax your shoulder muscles completely before you begin the next repetition. Repeat __________ times. Complete this exercise __________ times per day.  STRENGTH - External Rotators, Isometric  Keep your right / left elbow at your side and bend it 90 degrees.  Step into a door frame so that the outside of your right / left wrist can press against the door frame without your upper arm leaving your side.  Gently press your right / left wrist into the door frame, as if you were trying to swing the back of your hand away from your stomach. Gradually increase the tension, until you are pressing as hard as you can, without shrugging your shoulder or increasing any shoulder discomfort.  Hold for __________ seconds.  Release the tension slowly. Relax your shoulder muscles completely before you begin the next repetition. Repeat __________ times. Complete this exercise __________ times per day.  STRENGTH - Supraspinatus   Stand or sit with good posture. Grasp a __________ weight, or an exercise band or tubing, so that your hand is "thumbs-up," like you are shaking hands.  Slowly lift your right / left arm in a "V" away from your thigh, diagonally into the space between your side and straight ahead. Lift your hand to  shoulder height or as far as you can, without increasing any shoulder pain. At first, many people do not lift their hands above shoulder height.  Avoid shrugging your right / left shoulder as your arm rises, by keeping your shoulder blade tucked down and toward your mid-back spine.  Hold for __________ seconds. Control the descent of your hand, as you slowly return to your starting position. Repeat __________ times. Complete this exercise __________ times per day.  STRENGTH - External Rotators  Secure a rubber exercise band or tubing to a fixed object (table, pole) so that it is at the same height as your right / left elbow when you are standing or sitting on a firm surface.  Stand or sit so that the secured exercise band is  at your uninjured side.  Bend your right / left elbow 90 degrees. Place a folded towel or small pillow under your right / left arm, so that your elbow is a few inches away from your side.  Keeping the tension on the exercise band, pull it away from your body, as if pivoting on your elbow. Be sure to keep your body steady, so that the movement is coming only from your rotating shoulder.  Hold for __________ seconds. Release the tension in a controlled manner, as you return to the starting position. Repeat __________ times. Complete this exercise __________ times per day.  STRENGTH - Internal Rotators   Secure a rubber exercise band or tubing to a fixed object (table, pole) so that it is at the same height as your right / left elbow when you are standing or sitting on a firm surface.  Stand or sit so that the secured exercise band is at your right / left side.  Bend your elbow 90 degrees. Place a folded towel or small pillow under your right / left arm so that your elbow is a few inches away from your side.  Keeping the tension on the exercise band, pull it across your body, toward your stomach. Be sure to keep your body steady, so that the movement is coming only from  your rotating shoulder.  Hold for __________ seconds. Release the tension in a controlled manner, as you return to the starting position. Repeat __________ times. Complete this exercise __________ times per day.  STRENGTH - Scapular Protractors, Standing   Stand arms length away from a wall. Place your hands on the wall, keeping your elbows straight.  Begin by dropping your shoulder blades down and toward your mid-back spine.  To strengthen your protractors, keep your shoulder blades down, but slide them forward on your rib cage. It will feel as if you are lifting the back of your rib cage away from the wall. This is a subtle motion and can be challenging to complete. Ask your caregiver for further instruction, if you are not sure you are doing the exercise correctly.  Hold for __________ seconds. Slowly return to the starting position, resting the muscles completely before starting the next repetition. Repeat __________ times. Complete this exercise __________ times per day. STRENGTH - Scapular Protractors, Supine  Lie on your back on a firm surface. Extend your right / left arm straight into the air while holding a __________ weight in your hand.  Keeping your head and back in place, lift your shoulder off the floor.  Hold for __________ seconds. Slowly return to the starting position, and allow your muscles to relax completely before starting the next repetition. Repeat __________ times. Complete this exercise __________ times per day. STRENGTH - Scapular Protractors, Quadruped  Get onto your hands and knees, with your shoulders directly over your hands (or as close as you can be, comfortably).  Keeping your elbows locked, lift the back of your rib cage up into your shoulder blades, so your mid-back rounds out. Keep your neck muscles relaxed.  Hold this position for __________ seconds. Slowly return to the starting position and allow your muscles to relax completely before starting the  next repetition. Repeat __________ times. Complete this exercise __________ times per day.  STRENGTH - Scapular Retractors  Secure a rubber exercise band or tubing to a fixed object (table, pole), so that it is at the height of your shoulders when you are either standing, or sitting on a  firm armless chair.  With a palm down grip, grasp an end of the band in each hand. Straighten your elbows and lift your hands straight in front of you, at shoulder height. Step back, away from the secured end of the band, until it becomes tense.  Squeezing your shoulder blades together, draw your elbows back toward your sides, as you bend them. Keep your upper arms lifted away from your body throughout the exercise.  Hold for __________ seconds. Slowly ease the tension on the band, as you reverse the directions and return to the starting position. Repeat __________ times. Complete this exercise __________ times per day. STRENGTH - Shoulder Extensors   Secure a rubber exercise band or tubing to a fixed object (table, pole) so that it is at the height of your shoulders when you are either standing, or sitting on a firm armless chair.  With a thumbs-up grip, grasp an end of the band in each hand. Straighten your elbows and lift your hands straight in front of you, at shoulder height. Step back, away from the secured end of the band, until it becomes tense.  Squeezing your shoulder blades together, pull your hands down to the sides of your thighs. Do not allow your hands to go behind you.  Hold for __________ seconds. Slowly ease the tension on the band, as you reverse the directions and return to the starting position. Repeat __________ times. Complete this exercise __________ times per day.  STRENGTH - Scapular Retractors and External Rotators   Secure a rubber exercise band or tubing to a fixed object (table, pole) so that it is at the height as your shoulders, when you are either standing, or sitting on a  firm armless chair.  With a palm down grip, grasp an end of the band in each hand. Bend your elbows 90 degrees and lift your elbows to shoulder height, at your sides. Step back, away from the secured end of the band, until it becomes tense.  Squeezing your shoulder blades together, rotate your shoulders so that your upper arms and elbows remain stationary, but your fists travel upward to head height.  Hold for __________ seconds. Slowly ease the tension on the band, as you reverse the directions and return to the starting position. Repeat __________ times. Complete this exercise __________ times per day.  STRENGTH - Scapular Retractors and External Rotators, Rowing   Secure a rubber exercise band or tubing to a fixed object (table, pole) so that it is at the height of your shoulders, when you are either standing, or sitting on a firm armless chair.  With a palm down grip, grasp an end of the band in each hand. Straighten your elbows and lift your hands straight in front of you, at shoulder height. Step back, away from the secured end of the band, until it becomes tense.  Step 1: Squeeze your shoulder blades together. Bending your elbows, draw your hands to your chest, as if you are rowing a boat. At the end of this motion, your hands and elbow should be at shoulder height and your elbows should be out to your sides.  Step 2: Rotate your shoulders, to raise your hands above your head. Your forearms should be vertical and your upper arms should be horizontal.  Hold for __________ seconds. Slowly ease the tension on the band, as you reverse the directions and return to the starting position. Repeat __________ times. Complete this exercise __________ times per day.  STRENGTH  Scapular  Depressors  Find a sturdy chair without wheels, such as a dining room chair.  Keeping your feet on the floor, and your hands on the chair arms, lift your bottom up from the seat, and lock your elbows.  Keeping  your elbows straight, allow gravity to pull your body weight down. Your shoulders will rise toward your ears.  Raise your body against gravity by drawing your shoulder blades down your back, shortening the distance between your shoulders and ears. Although your feet should always maintain contact with the floor, your feet should progressively support less body weight, as you get stronger.  Hold for __________ seconds. In a controlled and slow manner, lower your body weight to begin the next repetition. Repeat __________ times. Complete this exercise __________ times per day.  Document Released: 09/20/2005 Document Revised: 12/13/2011 Document Reviewed: 01/02/2009 [facility] Patient Information 2013 [place], [place].

## 2012-10-13 NOTE — Addendum Note (Signed)
Addended by: Jomarie Longs on: 10/13/2012 12:57 PM   Modules accepted: Orders

## 2012-10-18 ENCOUNTER — Ambulatory Visit (INDEPENDENT_AMBULATORY_CARE_PROVIDER_SITE_OTHER): Payer: Managed Care, Other (non HMO) | Admitting: Physician Assistant

## 2012-10-18 ENCOUNTER — Encounter: Payer: Self-pay | Admitting: Physician Assistant

## 2012-10-18 VITALS — BP 141/90 | Temp 98.1°F | Wt 180.0 lb

## 2012-10-18 DIAGNOSIS — J45901 Unspecified asthma with (acute) exacerbation: Secondary | ICD-10-CM

## 2012-10-18 DIAGNOSIS — J45909 Unspecified asthma, uncomplicated: Secondary | ICD-10-CM

## 2012-10-18 MED ORDER — BENZONATATE 100 MG PO CAPS: 100.0000 mg | ORAL_CAPSULE | Freq: Three times a day (TID) | ORAL | Status: DC | PRN

## 2012-10-18 MED ORDER — ALBUTEROL SULFATE HFA 108 (90 BASE) MCG/ACT IN AERS: 2.0000 | INHALATION_SPRAY | Freq: Four times a day (QID) | RESPIRATORY_TRACT | Status: DC | PRN

## 2012-10-18 MED ORDER — AZITHROMYCIN 250 MG PO TABS: ORAL_TABLET | ORAL | Status: DC

## 2012-10-18 NOTE — Patient Instructions (Addendum)
Mucinex D twice a day. Vitamin C and Zinc. Umcka cold care.   Bronchitis Bronchitis is the body's way of reacting to injury and/or infection (inflammation) of the bronchi. Bronchi are the air tubes that extend from the windpipe into the lungs. If the inflammation becomes severe, it may cause shortness of breath. CAUSES  Inflammation may be caused by:  A virus.  Germs (bacteria).  Dust.  Allergens.  Pollutants and many other irritants. The cells lining the bronchial tree are covered with tiny hairs (cilia). These constantly beat upward, away from the lungs, toward the mouth. This keeps the lungs free of pollutants. When these cells become too irritated and are unable to do their job, mucus begins to develop. This causes the characteristic cough of bronchitis. The cough clears the lungs when the cilia are unable to do their job. Without either of these protective mechanisms, the mucus would settle in the lungs. Then you would develop pneumonia. Smoking is a common cause of bronchitis and can contribute to pneumonia. Stopping this habit is the single most important thing you can do to help yourself. TREATMENT   Your caregiver may prescribe an antibiotic if the cough is caused by bacteria. Also, medicines that open up your airways make it easier to breathe. Your caregiver may also recommend or prescribe an expectorant. It will loosen the mucus to be coughed up. Only take over-the-counter or prescription medicines for pain, discomfort, or fever as directed by your caregiver.  Removing whatever causes the problem (smoking, for example) is critical to preventing the problem from getting worse.  Cough suppressants may be prescribed for relief of cough symptoms.  Inhaled medicines may be prescribed to help with symptoms now and to help prevent problems from returning.  For those with recurrent (chronic) bronchitis, there may be a need for steroid medicines. SEEK IMMEDIATE MEDICAL CARE IF:    During treatment, you develop more pus-like mucus (purulent sputum).  You have a fever.  Your baby is older than 3 months with a rectal temperature of 102 F (38.9 C) or higher.  Your baby is 33 months old or younger with a rectal temperature of 100.4 F (38 C) or higher.  You become progressively more ill.  You have increased difficulty breathing, wheezing, or shortness of breath. It is necessary to seek immediate medical care if you are elderly or sick from any other disease. MAKE SURE YOU:   Understand these instructions.  Will watch your condition.  Will get help right away if you are not doing well or get worse. Document Released: 09/20/2005 Document Revised: 12/13/2011 Document Reviewed: 07/30/2008 [facility] Patient Information 2013 [place], [place].

## 2012-10-18 NOTE — Progress Notes (Signed)
  Subjective:    Patient ID: Shirley Vasquez, female    DOB: [birth year], 47 y.o.   MRN: [id number]  HPI Patient presents to clinic with cough, fever and diarrhea. Her cough started 5 days ago and not usually productive. Her fever as been off and on but as high as 101.2. She denies any muscle aches, chills, nausea or vomiting. She has had diarrhea about 4 days ago but is resolving. She has not had any abdominal pain. She has taken tylenol, decongestant, gargling with salt water. Her throat is sore but still able to eat and drink. She feels better during the day but worse at night and in the morning. She has a history of asthma but has been controlled. She does not have albuterol inhaler. Her chest feels tight and she has had wheezing starting yesterday.     Review of Systems     Objective:   Physical Exam  Constitutional: She is oriented to person, place, and time. She appears well-developed and well-nourished.  HENT:  Head: Normocephalic and atraumatic.  Right Ear: External ear normal.  Left Ear: External ear normal.       TM's erythematous in canal but not tender to touch.   Turbinates red but not swollen.   Oropharynx is erythematous but tonsils not swollen.   Eyes: Conjunctivae normal are normal.  Neck: Normal range of motion. Neck supple.  Cardiovascular: Normal rate, regular rhythm and normal heart sounds.   Pulmonary/Chest: Effort normal and breath sounds normal. She has no wheezes.  Abdominal: Soft. Bowel sounds are normal. She exhibits no distension and no mass. There is no tenderness. There is no rebound and no guarding.  Lymphadenopathy:    She has no cervical adenopathy.  Neurological: She is alert and oriented to person, place, and time.  Skin: Skin is warm and dry.  Psychiatric: She has a normal mood and affect. Her behavior is normal.          Assessment & Plan:  Bronchitis with asthma exacerbation- Gave albuterol inhaler to use twice a day for 7 days. Start using  mucinex twice a day. Gave tessalon pearle to use up to three times a day for cough. Could consider umcka cold care. Rx for zpak given due to allergy to PCN if not improving in 48 hours. Continue with symptomatic care. Handout given.

## 2012-10-25 ENCOUNTER — Telehealth: Payer: Self-pay | Admitting: *Deleted

## 2012-10-25 NOTE — Telephone Encounter (Signed)
Shirley Vasquez with imaging called to let you know this patient has canceled her MRI of shoulder because it is feeling better.

## 2012-12-01 ENCOUNTER — Telehealth: Payer: Self-pay | Admitting: *Deleted

## 2012-12-01 DIAGNOSIS — M25511 Pain in right shoulder: Secondary | ICD-10-CM

## 2012-12-01 NOTE — Telephone Encounter (Signed)
Pt calls & states that her shoulder pain is starting to come back.  She cancelled the MRI last time because it had started feeling better.  Would you like her to come in again or can we just order another MRI? Please advise

## 2012-12-04 NOTE — Telephone Encounter (Signed)
Pt.notified

## 2012-12-04 NOTE — Telephone Encounter (Signed)
Since pain comes and goes let's try PT first.

## 2012-12-07 ENCOUNTER — Ambulatory Visit: Payer: Managed Care, Other (non HMO) | Admitting: Family Medicine

## 2012-12-07 DIAGNOSIS — Z0289 Encounter for other administrative examinations: Secondary | ICD-10-CM

## 2012-12-20 ENCOUNTER — Ambulatory Visit: Payer: Managed Care, Other (non HMO) | Attending: Family Medicine | Admitting: Physical Therapy

## 2012-12-20 ENCOUNTER — Ambulatory Visit (INDEPENDENT_AMBULATORY_CARE_PROVIDER_SITE_OTHER): Payer: Managed Care, Other (non HMO) | Admitting: Physical Therapy

## 2012-12-20 DIAGNOSIS — M2550 Pain in unspecified joint: Secondary | ICD-10-CM | POA: Insufficient documentation

## 2012-12-20 DIAGNOSIS — R209 Unspecified disturbances of skin sensation: Secondary | ICD-10-CM | POA: Insufficient documentation

## 2012-12-20 DIAGNOSIS — IMO0001 Reserved for inherently not codable concepts without codable children: Secondary | ICD-10-CM | POA: Insufficient documentation

## 2012-12-20 DIAGNOSIS — M6281 Muscle weakness (generalized): Secondary | ICD-10-CM | POA: Insufficient documentation

## 2012-12-22 ENCOUNTER — Encounter (INDEPENDENT_AMBULATORY_CARE_PROVIDER_SITE_OTHER): Payer: Managed Care, Other (non HMO) | Admitting: Physical Therapy

## 2012-12-22 ENCOUNTER — Ambulatory Visit: Payer: Managed Care, Other (non HMO) | Admitting: Physical Therapy

## 2012-12-25 ENCOUNTER — Ambulatory Visit: Payer: Managed Care, Other (non HMO) | Admitting: Physical Therapy

## 2012-12-28 ENCOUNTER — Encounter: Payer: Managed Care, Other (non HMO) | Admitting: Physical Therapy

## 2013-01-01 ENCOUNTER — Ambulatory Visit: Payer: Managed Care, Other (non HMO) | Admitting: Physical Therapy

## 2013-01-04 ENCOUNTER — Encounter: Payer: Managed Care, Other (non HMO) | Admitting: Physical Therapy

## 2013-01-05 ENCOUNTER — Ambulatory Visit: Payer: Managed Care, Other (non HMO) | Attending: Family Medicine | Admitting: Physical Therapy

## 2013-01-05 DIAGNOSIS — M2550 Pain in unspecified joint: Secondary | ICD-10-CM | POA: Insufficient documentation

## 2013-01-05 DIAGNOSIS — M6281 Muscle weakness (generalized): Secondary | ICD-10-CM | POA: Insufficient documentation

## 2013-01-05 DIAGNOSIS — IMO0001 Reserved for inherently not codable concepts without codable children: Secondary | ICD-10-CM | POA: Insufficient documentation

## 2013-01-08 ENCOUNTER — Ambulatory Visit: Payer: Managed Care, Other (non HMO) | Admitting: Physical Therapy

## 2013-01-08 ENCOUNTER — Encounter: Payer: Managed Care, Other (non HMO) | Admitting: Physical Therapy

## 2013-01-10 ENCOUNTER — Ambulatory Visit: Payer: Managed Care, Other (non HMO) | Admitting: Physical Therapy

## 2013-01-11 ENCOUNTER — Encounter: Payer: Managed Care, Other (non HMO) | Admitting: Physical Therapy

## 2013-01-17 ENCOUNTER — Ambulatory Visit: Payer: Managed Care, Other (non HMO) | Admitting: Physician Assistant

## 2013-01-24 ENCOUNTER — Encounter: Payer: Self-pay | Admitting: Physician Assistant

## 2013-01-24 ENCOUNTER — Ambulatory Visit (INDEPENDENT_AMBULATORY_CARE_PROVIDER_SITE_OTHER): Payer: Managed Care, Other (non HMO) | Admitting: Physician Assistant

## 2013-01-24 VITALS — BP 126/81 | HR 91 | Wt 178.0 lb

## 2013-01-24 DIAGNOSIS — F419 Anxiety disorder, unspecified: Secondary | ICD-10-CM

## 2013-01-24 DIAGNOSIS — F410 Panic disorder [episodic paroxysmal anxiety] without agoraphobia: Secondary | ICD-10-CM

## 2013-01-24 DIAGNOSIS — M7541 Impingement syndrome of right shoulder: Secondary | ICD-10-CM

## 2013-01-24 DIAGNOSIS — J45909 Unspecified asthma, uncomplicated: Secondary | ICD-10-CM

## 2013-01-24 DIAGNOSIS — K219 Gastro-esophageal reflux disease without esophagitis: Secondary | ICD-10-CM

## 2013-01-24 DIAGNOSIS — F411 Generalized anxiety disorder: Secondary | ICD-10-CM

## 2013-01-24 MED ORDER — GABAPENTIN 100 MG PO CAPS: 100.0000 mg | ORAL_CAPSULE | Freq: Every day | ORAL | Status: DC

## 2013-01-24 MED ORDER — SERTRALINE HCL 100 MG PO TABS: ORAL_TABLET | ORAL | Status: DC

## 2013-01-24 MED ORDER — FLUTICASONE PROPIONATE 50 MCG/ACT NA SUSP: 2.0000 | Freq: Every day | NASAL | Status: DC

## 2013-01-24 MED ORDER — CLONAZEPAM 1 MG PO TABS: 1.0000 mg | ORAL_TABLET | Freq: Two times a day (BID) | ORAL | Status: DC | PRN

## 2013-01-24 MED ORDER — ESOMEPRAZOLE MAGNESIUM 40 MG PO PACK: 40.0000 mg | PACK | Freq: Every day | ORAL | Status: DC

## 2013-01-24 NOTE — Progress Notes (Signed)
  Subjective:    Patient ID: Shirley Vasquez, female    DOB: 1965/12/05, 47 y.o.   MRN: [id number]  HPI Patient presents to the clinic to get a refill medications and to followup own right shoulder impingement syndrome.  Her right shoulder with numbness and tingling has improved 90%. She has undergone 8 weeks of physical therapy and doing home exercises currently. She no longer uses anti-inflammatories for pain control. The only time her shoulder does bother her is at night usually around 2 or 3 AM. She is aware that it is most likely due to the way she is sleeping it has made changes in her sleep positions.  Anxiety/panic attacks are very well controlled on Zoloft and occasional Klonopin. She reports she is only taking: Up in 1 tab once a week or as needed. She feels like changing her diet and exercising more has helped also with decreasing panic attacks. She denies any thoughts of suicide or harming others.  GERD ongoing and controlled with Nexium. She does not take Nexium every day. She does take as needed or when her diet has more greasy items in it. She has made a lot of changes to the way she cooks food and eats out and that has helped significantly.  Seasonal allergies/asthma currently well controlled. She did start back on her Claritin D at the beginning of March and using Flonase daily. She does need a refill on Flonase. She has not had to use her inhaler recently.   Review of Systems     Objective:   Physical Exam  Constitutional: She is oriented to person, place, and time. She appears well-developed and well-nourished.  HENT:  Head: Normocephalic and atraumatic.  Eyes: Conjunctivae are normal.  Neck: Normal range of motion. Neck supple.  Cardiovascular: Normal rate, regular rhythm and normal heart sounds.   Pulmonary/Chest: Effort normal and breath sounds normal. She has no wheezes.  Musculoskeletal:  Normal range of motion of right shoulder without pain. Strength 5 out of 5.  Grip 5 out of 5.  Lymphadenopathy:    She has no cervical adenopathy.  Neurological: She is alert and oriented to person, place, and time.  Skin: Skin is warm and dry.  Psychiatric: She has a normal mood and affect. Her behavior is normal.          Assessment & Plan:  Right shoulder impingement-very pleased with her response to physical therapy. Encouraged patient to use anti-inflammatories as needed. Continue to do home exercises and work on sleep positions. Since numbness and tingling in impingement is happening more at night could consider taking Neurontin 100 mg at bedtime to see if that helps. I did give a prescription for patient to try.   Anxiety/panic attacks-refilled Zoloft and Klonopin. Followup in 6 months.  GERD-refilled Nexium. Continue to make diet changes.  Asthma/seasonal allergies-stay on Zyrtec and Flonase daily. Continue to use albuterol as needed. Call need refills.  Need complete physical at the beginning of fall we'll do blood work then.

## 2013-03-02 ENCOUNTER — Ambulatory Visit (INDEPENDENT_AMBULATORY_CARE_PROVIDER_SITE_OTHER): Payer: Managed Care, Other (non HMO) | Admitting: Physician Assistant

## 2013-03-02 ENCOUNTER — Encounter: Payer: Self-pay | Admitting: Physician Assistant

## 2013-03-02 VITALS — BP 121/74 | HR 77 | Temp 98.1°F | Wt 179.0 lb

## 2013-03-02 DIAGNOSIS — H109 Unspecified conjunctivitis: Secondary | ICD-10-CM

## 2013-03-02 DIAGNOSIS — H6692 Otitis media, unspecified, left ear: Secondary | ICD-10-CM

## 2013-03-02 DIAGNOSIS — H6690 Otitis media, unspecified, unspecified ear: Secondary | ICD-10-CM

## 2013-03-02 MED ORDER — AZITHROMYCIN 250 MG PO TABS: ORAL_TABLET | ORAL | Status: DC

## 2013-03-02 MED ORDER — POLYMYXIN B-TRIMETHOPRIM 10000-0.1 UNIT/ML-% OP SOLN: 1.0000 [drp] | OPHTHALMIC | Status: DC

## 2013-03-02 NOTE — Progress Notes (Signed)
  Subjective:    Patient ID: Shirley Vasquez, female    DOB: Sep 12, 1966, 47 y.o.   MRN: [id number]  HPI Patient presents to the clinic with left ear pain, congestion and itching for 1 week. Has a history of ear infections. Had tubes removed last year in ears. Taken Claritin but not helping. Taking OTC ear drops to help with pain but didn't really help. Fever low grade at night and taken tylenol. This morning her left eye started draining thick yellow fluid. She woke up with her eyes crusted shut. Denies any fever, chills, n/v/d, cough, SOB, wheezing. Tried OTC ear drops don't seem to be helping.    Review of Systems     Objective:   Physical Exam  Constitutional: She is oriented to person, place, and time. She appears well-developed and well-nourished.  HENT:  Head: Normocephalic and atraumatic.  Right Ear: External ear normal.  Nose: Nose normal.  Mouth/Throat: Oropharynx is clear and moist.  Left TM dull with redness. [place] not able to be viewed. No light reflex.   Eyes:  Thick discharge pooling in left eye around lacrimal gland.  Neck: Normal range of motion. Neck supple.  Left anterior cervical nodes enlarged without tenderness.   Cardiovascular: Normal rate, regular rhythm and normal heart sounds.   Pulmonary/Chest: Effort normal and breath sounds normal.  Neurological: She is oriented to person, place, and time.  Skin: Skin is warm and dry.  Psychiatric: She has a normal mood and affect. Her behavior is normal.          Assessment & Plan:  Left otitis media/left eye conjunctivitis- Polytrim for left eye. Discussed to change mascara and if wears contacts. Allergic to amoxicillin gave zpak for ears. Call if not improving. Discussed if continues may need to conisder ENT referral for potential replacement of tubes.

## 2013-03-02 NOTE — Patient Instructions (Addendum)
Otitis Media, Adult A middle ear infection is an infection in the space behind the eardrum. The medical name for this is "otitis media." It may happen after a common cold. It is caused by a germ that starts growing in that space. You may feel swollen glands in your neck on the side of the ear infection. HOME CARE INSTRUCTIONS   Take your medicine as directed until it is gone, even if you feel better after the first few days.  Only take over-the-counter or prescription medicines for pain, discomfort, or fever as directed by your caregiver.  Occasional use of a nasal decongestant a couple times per day may help with discomfort and help the eustachian tube to drain better. Follow up with your caregiver in 10 to 14 days or as directed, to be certain that the infection has cleared. Not keeping the appointment could result in a chronic or permanent injury, pain, hearing loss and disability. If there is any problem keeping the appointment, you must call back to this facility for assistance. SEEK IMMEDIATE MEDICAL CARE IF:   You are not getting better in 2 to 3 days.  You have pain that is not controlled with medication.  You feel worse instead of better.  You cannot use the medication as directed.  You develop swelling, redness or pain around the ear or stiffness in your neck. MAKE SURE YOU:   Understand these instructions.  Will watch your condition.  Will get help right away if you are not doing well or get worse. Document Released: 06/25/2004 Document Revised: 12/13/2011 Document Reviewed: 04/26/2008 [facility] Patient Information 2014 [place], [place]. Conjunctivitis Conjunctivitis is commonly called "pink eye." Conjunctivitis can be caused by bacterial or viral infection, allergies, or injuries. There is usually redness of the lining of the eye, itching, discomfort, and sometimes discharge. There may be deposits of matter along the eyelids. A viral infection usually causes a watery  discharge, while a bacterial infection causes a yellowish, thick discharge. Pink eye is very contagious and spreads by direct contact. You may be given antibiotic eyedrops as part of your treatment. Before using your eye medicine, remove all drainage from the eye by washing gently with warm water and cotton balls. Continue to use the medication until you have awakened 2 mornings in a row without discharge from the eye. Do not rub your eye. This increases the irritation and helps spread infection. Use separate towels from other household members. Wash your hands with soap and water before and after touching your eyes. Use cold compresses to reduce pain and sunglasses to relieve irritation from light. Do not wear contact lenses or wear eye makeup until the infection is gone. SEEK MEDICAL CARE IF:   Your symptoms are not better after 3 days of treatment.  You have increased pain or trouble seeing.  The outer eyelids become very red or swollen. Document Released: 10/28/2004 Document Revised: 12/13/2011 Document Reviewed: 09/20/2005 [facility] Patient Information 2014 [place], [place].

## 2013-05-03 ENCOUNTER — Telehealth: Payer: Self-pay | Admitting: Sports Medicine

## 2013-05-03 ENCOUNTER — Encounter: Payer: Self-pay | Admitting: Sports Medicine

## 2013-05-03 ENCOUNTER — Ambulatory Visit (INDEPENDENT_AMBULATORY_CARE_PROVIDER_SITE_OTHER): Payer: Managed Care, Other (non HMO) | Admitting: Sports Medicine

## 2013-05-03 VITALS — BP 119/74 | HR 83 | Wt 168.0 lb

## 2013-05-03 DIAGNOSIS — L237 Allergic contact dermatitis due to plants, except food: Secondary | ICD-10-CM | POA: Insufficient documentation

## 2013-05-03 DIAGNOSIS — L255 Unspecified contact dermatitis due to plants, except food: Secondary | ICD-10-CM

## 2013-05-03 DIAGNOSIS — M62838 Other muscle spasm: Secondary | ICD-10-CM

## 2013-05-03 MED ORDER — PREDNISONE 50 MG PO TABS: ORAL_TABLET | ORAL | Status: DC

## 2013-05-03 MED ORDER — NAPROXEN 500 MG PO TABS: 500.0000 mg | ORAL_TABLET | Freq: Two times a day (BID) | ORAL | Status: DC

## 2013-05-03 NOTE — Progress Notes (Signed)
  Subjective:    CC: Shoulder pain  HPI: This is a very pleasant 48 year old female who comes in with complaints of pain in her upper left shoulder, that seems to be radiating from her neck, the neck feels stiff, no trauma, she woke up this way. Symptoms are moderate, persistent, no symptoms travel down past the shoulder. Has not tried any oral medications for this.  She also has a rash on her arms from poison ivy.  Past medical history, Surgical history, Family history not pertinant except as noted below, Social history, Allergies, and medications have been entered into the medical record, reviewed, and no changes needed.   Review of Systems: No fevers, chills, night sweats, weight loss, chest pain, or shortness of breath.   Objective:    General: Well Developed, well nourished, and in no acute distress.  Neuro: Alert and oriented x3, extra-ocular muscles intact, sensation grossly intact.  HEENT: Normocephalic, atraumatic, pupils equal round reactive to light, neck supple, no masses, no lymphadenopathy, thyroid nonpalpable.  Skin: Warm and dry, papulovesicular rash on both inner arms. Cardiac: Regular rate and rhythm, no murmurs rubs or gallops, no lower extremity edema.  Respiratory: Clear to auscultation bilaterally. Not using accessory muscles, speaking in full sentences. Neck: Inspection unremarkable. No palpable stepoffs. Negative Spurling's maneuver. Full neck range of motion Grip strength and sensation normal in bilateral hands Strength good C4 to T1 distribution No sensory change to C4 to T1 Negative Hoffman sign bilaterally Reflexes normal Left Shoulder: Inspection reveals no abnormalities, atrophy or asymmetry. Palpation is normal with no tenderness over AC joint or bicipital groove. ROM is full in all planes. Rotator cuff strength normal throughout. No signs of impingement with negative Neer and Hawkin's tests, empty can sign. Speeds and Yergason's tests normal. No  labral pathology noted with negative Obrien's, negative clunk and good stability. Normal scapular function observed. No painful arc and no drop arm sign. No apprehension sign Impression and Recommendations:

## 2013-05-03 NOTE — Telephone Encounter (Signed)
Not able to rest..would like to try muscle relaxer after all.  Please call script in to [facility] off [address] in [place].

## 2013-05-03 NOTE — Assessment & Plan Note (Signed)
The prednisone but am giving her for her neck spasm will resolve this.

## 2013-05-03 NOTE — Assessment & Plan Note (Signed)
Benign exam. Naproxen, prednisone. Home exercises. Return if no better in 2-3 weeks.

## 2013-05-04 MED ORDER — CYCLOBENZAPRINE HCL 10 MG PO TABS: ORAL_TABLET | ORAL | Status: DC

## 2013-05-04 NOTE — Telephone Encounter (Signed)
Patient states she is unable to rest, she wants to try muscle relaxer.Send to [facility] [address]. Please see attached note.

## 2013-05-04 NOTE — Telephone Encounter (Signed)
Called in.

## 2013-05-16 ENCOUNTER — Encounter: Payer: Self-pay | Admitting: *Deleted

## 2013-05-16 ENCOUNTER — Emergency Department (INDEPENDENT_AMBULATORY_CARE_PROVIDER_SITE_OTHER)
Admission: EM | Admit: 2013-05-16 | Discharge: 2013-05-16 | Disposition: A | Discharge status: Home / Self Care | Payer: Managed Care, Other (non HMO) | Source: Home / Self Care | Attending: Family Medicine | Admitting: Family Medicine

## 2013-05-16 DIAGNOSIS — R197 Diarrhea, unspecified: Secondary | ICD-10-CM

## 2013-05-16 DIAGNOSIS — H6690 Otitis media, unspecified, unspecified ear: Secondary | ICD-10-CM

## 2013-05-16 DIAGNOSIS — J069 Acute upper respiratory infection, unspecified: Secondary | ICD-10-CM

## 2013-05-16 DIAGNOSIS — H6692 Otitis media, unspecified, left ear: Secondary | ICD-10-CM

## 2013-05-16 LAB — POCT URINALYSIS DIP (MANUAL ENTRY)
Bilirubin, UA: NEGATIVE
Glucose, UA: NEGATIVE
Ketones, POC UA: NEGATIVE
Leukocytes, UA: NEGATIVE
pH, UA: 6.5 (ref 5–8)

## 2013-05-16 MED ORDER — AZITHROMYCIN 250 MG PO TABS: ORAL_TABLET | ORAL | Status: DC

## 2013-05-16 MED ORDER — BENZONATATE 100 MG PO CAPS: ORAL_CAPSULE | ORAL | Status: DC

## 2013-05-16 NOTE — ED Provider Notes (Addendum)
CSN: [id number]     Arrival date & time 05/16/13  1949 History     First MD Initiated Contact with Patient 05/16/13 2014     Chief Complaint  Patient presents with  . Hot Flashes  . Diarrhea  . Shaking      HPI Comments: Patient complains of onset of a mild cough about 5 days ago associated with a sensation of "hotness" over her sternum, improved with Tylenol.  Shortly thereafter she developed watery diarrhea that has persisted.  She also developed sneezing and sinus congestion.  She had no sore throat until today.  She also had no fever, but today has developed chills.  She states that she has a history of mild asthma, usually assymptomatic except during URI.  She had some wheezing improved with her albuterol inhaler.  She states that she has had recurring left ear infections, and her left ear now feels clogged. She notes that she has had increased urine frequency, but no dysuria.  Her diarrhea has not responded very well to Imodium.  She admits that she has been drinking plenty of milk since her diarrhea started.  Denies blood in stool.  Denies recent foreign travel, or drinking untreated water in a wilderness environment.   The history is provided by the patient.    Past Medical History  Diagnosis Date  . Anxiety   . Fibroid     Uterine ablation   Past Surgical History  Procedure Laterality Date  . Cholecystectomy    . Bladder suspension    . Tonsillectomy and adenoidectomy    . Tubal ligation    . Uternine ablasion    . Wisdom tooth extraction     Family History  Problem Relation Age of Onset  . Cancer Mother     breast  . Heart failure Mother   . Hypertension Mother    History  Substance Use Topics  . Smoking status: Never Smoker   . Smokeless tobacco: Not on file  . Alcohol Use: No   OB History   Grav Para Term Preterm Abortions TAB SAB Ect Mult Living                 Review of Systems + sore throat + cough No pleuritic pain + wheezing + nasal  congestion + post-nasal drainage No sinus pain/pressure No itchy/red eyes + left earache No hemoptysis No SOB No fever, + chills No nausea No vomiting + abdominal pain + diarrhea No urinary symptoms No skin rashes + fatigue + myalgias No headache Used OTC meds without relief   Allergies  Amoxicillin; Augmentin; Codeine; Hctz; and Sulfa antibiotics  Home Medications   Current Outpatient Rx  Name  Route  Sig  Dispense  Refill  . albuterol (PROVENTIL HFA;VENTOLIN HFA) 108 (90 BASE) MCG/ACT inhaler   Inhalation   Inhale 2 puffs into the lungs every 6 (six) hours as needed for wheezing.   1 Inhaler   2   . AMBULATORY NON FORMULARY MEDICATION      Medication Name: Compression stockings Diagnosis: Bilateral ankle edema  Diagnosis Code: 782.3   1 Package   0   . azithromycin (ZITHROMAX) 250 MG tablet      2 tab now and then 1 tab for 4 days.   6 each   0   . benzonatate (TESSALON) 100 MG capsule      Take one cap at bedtime as necessary for cough   12 capsule   0   .  clonazePAM (KLONOPIN) 1 MG tablet   Oral   Take 1 tablet (1 mg total) by mouth 2 (two) times daily as needed.   30 tablet   0   . cyclobenzaprine (FLEXERIL) 10 MG tablet      One half tab PO qHS, then increase gradually to one tab TID.   30 tablet   0   . esomeprazole (NEXIUM) 40 MG packet   Oral   Take 40 mg by mouth daily before breakfast.   90 each   1   . fluticasone (FLONASE) 50 MCG/ACT nasal spray   Nasal   Place 2 sprays into the nose daily.   16 g   12   . gabapentin (NEURONTIN) 100 MG capsule   Oral   Take 1 capsule (100 mg total) by mouth at bedtime.   30 capsule   2   . loratadine-pseudoephedrine (CLARITIN-D 24-HOUR) 10-240 MG per 24 hr tablet   Oral   Take 1 tablet by mouth daily.         . Multiple Vitamin (MULTIVITAMIN) tablet   Oral   Take 1 tablet by mouth daily.           . naproxen (NAPROSYN) 500 MG tablet   Oral   Take 1 tablet (500 mg total) by mouth  2 (two) times daily with a meal.   60 tablet   3   . predniSONE (DELTASONE) 50 MG tablet      One tab PO daily for 5 days.   5 tablet   0   . sertraline (ZOLOFT) 100 MG tablet      Take 1 and 1/2 tab daily.   135 tablet   1   . trimethoprim-polymyxin b (POLYTRIM) ophthalmic solution   Left Eye   Place 1 drop into the left eye every 4 (four) hours. For 10 days.   10 mL   0    BP 141/85  Pulse 127  Temp(Src) 98 F (36.7 C) (Oral)  Resp 18  Ht 5\' 3"  (1.6 m)  Wt 168 lb (76.204 kg)  BMI 29.77 kg/m2  SpO2 99% Physical Exam Nursing notes and Vital Signs reviewed. Appearance:  Patient appears healthy, stated age, and in no acute distress Eyes:  Pupils are equal, round, and reactive to light and accomodation.  Extraocular movement is intact.  Conjunctivae are not inflamed  Ears:  Canals normal.  Right tympanic membrane normal.  Left tympanic membrane scarred and erythematous with poor landmarks.  There appears to a small spot of purulent discharge inferiorly, possibly at site of a small perforation  Nose:  Congested turbinates.  No sinus tenderness.   Pharynx:  Normal Neck:  Supple.  Tender slightly enlarged posterior nodes are palpated bilaterally  Lungs:  Clear to auscultation.  Breath sounds are equal.  Chest:  Distinct tenderness to palpation over the mid-sternum.  Heart:  Regular rate and rhythm without murmurs, rubs, or gallops.  Abdomen:  Nontender without masses or hepatosplenomegaly.  Bowel sounds are present.  No CVA or flank tenderness.  Extremities:  No edema.  No calf tenderness Skin:  No rash present.   ED Course   Procedures  none  Labs Reviewed  POCT URINALYSIS DIP (MANUAL ENTRY):  SG >= 1.030; BLO trace-intact; otherwise negative    1. Otitis media, recurrent, left   2. Acute upper respiratory infections of unspecified site   3. Diarrhea; suggestive of legionella infection but there is no evidence of pneumonia and her illness is  mild.  Although patient  has urinary frequency, urinalysis is unremarkable..     MDM  Begin Z-pack to cover atypicals.  Diarrhea probably exacerbated by increased milk consumption. Prescription written for Benzonatate [facility]) to take at bedtime for night-time cough.  Take Mucinex D (guaifenesin with decongestant) twice daily for congestion.  Increase fluid intake, rest. May use Afrin nasal spray (or generic oxymetazoline) twice daily for about 5 days.  Also recommend using saline nasal spray several times daily and saline nasal irrigation (AYR is a common brand) Stop all antihistamines for now, and other non-prescription cough/cold preparations. Continue albuterol inhaler as needed. Begin clear liquids for about 18 to 24 hours, then may begin a BRAT diet when diarrhea decreasing.  Then gradually advance to a regular diet as tolerated.  Avoid milk products until well. To decrease diarrhea, mix one heaping tablespoon Citrucel (methylcellulose) in 8 oz water and drink one to three times daily.  Recommend follow-up with ENT for the recurring otitis media. If symptoms become significantly worse during the night or over the weekend, proceed to the local emergency room.   Lattie Haw, MD 05/17/13 1610  Lattie Haw, MD 05/17/13 [phone]

## 2013-05-16 NOTE — ED Notes (Signed)
Pt c/o clear runny nose, sneezing, diarrhea, hot flashes on and off x 5 days. She also c/o shakiness x tonight. She took a clonazepam at 1930 today.

## 2013-05-18 ENCOUNTER — Telehealth: Payer: Self-pay | Admitting: *Deleted

## 2013-05-18 ENCOUNTER — Other Ambulatory Visit: Payer: Self-pay | Admitting: Physician Assistant

## 2013-05-18 DIAGNOSIS — H6693 Otitis media, unspecified, bilateral: Secondary | ICD-10-CM

## 2013-05-18 MED ORDER — FLUTICASONE PROPIONATE 50 MCG/ACT NA SUSP: 2.0000 | Freq: Every day | NASAL | Status: DC

## 2013-05-18 NOTE — Telephone Encounter (Signed)
Pt calls and states that she was seen at our [facility] for ear pain and was given Zpak, instructed to use Mucinex for her nasal congestion.  She states her ear pain is still there, feels horrible and a lot of nasal congestion.  Has used Flonase in the past and this helped but her prescription has expired and wants to know if you can send another one to her pharmacy?   Also ask if you would refer her to another ENT ( [facility]) for second opinion on her ear- she has had 2 tubes placed in the ear and just doesn't feel like its getting any better- ear infections keep reoccuring. Barry Dienes, LPN

## 2013-05-18 NOTE — Telephone Encounter (Signed)
Ok will send flonase and new referral.

## 2013-05-21 ENCOUNTER — Telehealth: Payer: Self-pay | Admitting: *Deleted

## 2013-07-27 ENCOUNTER — Other Ambulatory Visit: Payer: Self-pay

## 2013-07-27 MED ORDER — CLONAZEPAM 1 MG PO TABS: 1.0000 mg | ORAL_TABLET | Freq: Two times a day (BID) | ORAL | Status: DC | PRN

## 2013-09-05 ENCOUNTER — Ambulatory Visit (INDEPENDENT_AMBULATORY_CARE_PROVIDER_SITE_OTHER): Payer: Managed Care, Other (non HMO) | Admitting: Physician Assistant

## 2013-09-05 ENCOUNTER — Encounter: Payer: Self-pay | Admitting: Physician Assistant

## 2013-09-05 VITALS — BP 118/65 | HR 71 | Wt 165.0 lb

## 2013-09-05 DIAGNOSIS — Z0000 Encounter for general adult medical examination without abnormal findings: Secondary | ICD-10-CM

## 2013-09-05 DIAGNOSIS — G5601 Carpal tunnel syndrome, right upper limb: Secondary | ICD-10-CM

## 2013-09-05 DIAGNOSIS — Z131 Encounter for screening for diabetes mellitus: Secondary | ICD-10-CM

## 2013-09-05 DIAGNOSIS — L738 Other specified follicular disorders: Secondary | ICD-10-CM

## 2013-09-05 DIAGNOSIS — E785 Hyperlipidemia, unspecified: Secondary | ICD-10-CM

## 2013-09-05 DIAGNOSIS — G5600 Carpal tunnel syndrome, unspecified upper limb: Secondary | ICD-10-CM

## 2013-09-05 MED ORDER — MELOXICAM 7.5 MG PO TABS: ORAL_TABLET | ORAL | Status: DC

## 2013-09-05 MED ORDER — ESOMEPRAZOLE MAGNESIUM 40 MG PO PACK: 40.0000 mg | PACK | Freq: Every day | ORAL | Status: DC

## 2013-09-05 NOTE — Patient Instructions (Addendum)
Keeping You Healthy  Get These Tests 1. Blood Pressure- Have your blood pressure checked once a year by your health care provider.  Normal blood pressure is 120/80. 2. Weight- Have your body mass index (BMI) calculated to screen for obesity.  BMI is measure of body fat based on height and weight.  You can also calculate your own BMI at www.nhlbisupport.com/bmi/. 3. Cholesterol- Have your cholesterol checked every 5 years starting at age 48 then yearly starting at age 45. 4. Chlamydia, HIV, and other sexually transmitted diseases- Get screened every year until age 25, then within three months of each new sexual provider. 5. Pap Smear- Every 1-3 years; discuss with your health care provider. 6. Mammogram- Every year starting at age 40  Take these medicines  Calcium with Vitamin D-Your body needs 1200 mg of Calcium each day and 800-1000 IU of Vitamin D daily.  Your body can only absorb 500 mg of Calcium at a time so Calcium must be taken in 2 or 3 divided doses throughout the day.  Multivitamin with folic acid- Once daily if it is possible for you to become pregnant.  Get these Immunizations  Gardasil-Series of three doses; prevents HPV related illness such as genital warts and cervical cancer.  Menactra-Single dose; prevents meningitis.  Tetanus shot- Every 10 years.  Flu shot-Every year.  Take these steps 1. Do not smoke-Your healthcare provider can help you quit.  For tips on how to quit go to www.smokefree.gov or call 1-800 QUITNOW. 2. Be physically active- Exercise 5 days a week for at least 30 minutes.  If you are not already physically active, start slow and gradually work up to 30 minutes of moderate physical activity.  Examples of moderate activity include walking briskly, dancing, swimming, bicycling, etc. 3. Breast Cancer- A self breast exam every month is important for early detection of breast cancer.  For more information and instruction on self breast exams, ask your  healthcare provider or www.womenshealth.gov/faq/breast-self-exam.cfm. 4. Eat a healthy diet- Eat a variety of healthy foods such as fruits, vegetables, whole grains, low fat milk, low fat cheeses, yogurt, lean meats, poultry and fish, beans, nuts, tofu, etc.  For more information go to www. Thenutritionsource.org 5. Drink alcohol in moderation- Limit alcohol intake to one drink or less per day. Never drink and drive. 6. Depression- Your emotional health is as important as your physical health.  If you're feeling down or losing interest in things you normally enjoy please talk to your healthcare provider about being screened for depression. 7. Dental visit- Brush and floss your teeth twice daily; visit your dentist twice a year. 8. Eye doctor- Get an eye exam at least every 2 years. 9. Helmet use- Always wear a helmet when riding a bicycle, motorcycle, rollerblading or skateboarding. 10. Safe sex- If you may be exposed to sexually transmitted infections, use a condom. 11. Seat belts- Seat belts can save your live; always wear one. 12. Smoke/Carbon Monoxide detectors- These detectors need to be installed on the appropriate level of your home. Replace batteries at least once a year. 13. Skin cancer- When out in the sun please cover up and use sunscreen 15 SPF or higher. 14. Violence- If anyone is threatening or hurting you, please tell your healthcare provider.   Carpal Tunnel Syndrome The carpal tunnel is a narrow area located on the palm side of your wrist. The tunnel is formed by the wrist bones and ligaments. Nerves, blood vessels, and tendons pass through the carpal tunnel.   Repeated wrist motion or certain diseases may cause swelling within the tunnel. This swelling pinches the main nerve in the wrist (median nerve) and causes the painful hand and arm condition called carpal tunnel syndrome. CAUSES   Repeated wrist motions.  Wrist injuries.  Certain diseases like arthritis, diabetes,  alcoholism, hyperthyroidism, and kidney failure.  Obesity.  Pregnancy. SYMPTOMS   A "pins and needles" feeling in your fingers or hand.  Tingling or numbness in your fingers or hand.  An aching feeling in your entire arm.  Wrist pain that goes up your arm to your shoulder.  Pain that goes down into your palm or fingers.  A weak feeling in your hands. DIAGNOSIS  Your caregiver will take your history and perform a physical exam. An electromyography test may be needed. This test measures electrical signals sent out by the muscles. The electrical signals are usually slowed by carpal tunnel syndrome. You may also need X-rays. TREATMENT  Carpal tunnel syndrome may clear up by itself. Your caregiver may recommend a wrist splint or medicine such as a nonsteroidal anti-inflammatory medicine. Cortisone injections may help. Sometimes, surgery may be needed to free the pinched nerve.  HOME CARE INSTRUCTIONS   Take all medicine as directed by your caregiver. Only take over-the-counter or prescription medicines for pain, discomfort, or fever as directed by your caregiver.  If you were given a splint to keep your wrist from bending, wear it as directed. It is important to wear the splint at night. Wear the splint for as long as you have pain or numbness in your hand, arm, or wrist. This may take 1 to 2 months.  Rest your wrist from any activity that may be causing your pain. If your symptoms are work-related, you may need to talk to your employer about changing to a job that does not require using your wrist.  Put ice on your wrist after long periods of wrist activity.  Put ice in a plastic bag.  Place a towel between your skin and the bag.  Leave the ice on for 15-20 minutes, 03-04 times a day.  Keep all follow-up visits as directed by your caregiver. This includes any orthopedic referrals, physical therapy, and rehabilitation. Any delay in getting necessary care could result in a delay or  failure of your condition to heal. SEEK IMMEDIATE MEDICAL CARE IF:   You have new, unexplained symptoms.  Your symptoms get worse and are not helped or controlled with medicines. MAKE SURE YOU:   Understand these instructions.  Will watch your condition.  Will get help right away if you are not doing well or get worse. Document Released: 09/17/2000 Document Revised: 12/13/2011 Document Reviewed: 08/06/2011 ExitCare Patient Information 2014 [employer].      

## 2013-09-05 NOTE — Progress Notes (Addendum)
Subjective:     Shirley Vasquez is a 47 y.o. female and is here for a comprehensive physical exam. The patient reports problems - right hand numbness for the last month. Takes advil but doesn't help.  pt has not been wearing brace regularly. She has had EMG in the past but were negative for significant nerve entrapment. Problems have increased over last month. She has been more active and using her wrist more. She been picking up leaves.   She also feels like her anxiety is worsening. She takes zoloft daily and klonapin as needed. She ends up taking 1 tab every other day. She is concerned zoloft might be causing HA. She off and on has been getting HA's after taking morning dose. She feels like anxiety is coming from not knowing her place. No one needs her right now. She has been some down but not routinely. She denies any suicidal thoughts. She is trying to exercise regularly.   History   Social History  . Marital Status: Married    Spouse Name: N/A    Number of Children: N/A  . Years of Education: N/A   Occupational History  . Not on file.   Social History Main Topics  . Smoking status: Never Smoker   . Smokeless tobacco: Not on file  . Alcohol Use: No  . Drug Use: No  . Sexual Activity: Not on file   Other Topics Concern  . Not on file   Social History Narrative  . No narrative on file   Health Maintenance  Topic Date Due  . Mammogram  09/14/2012  . Influenza Vaccine  05/04/2014  . Pap Smear  10/04/2014  . Tetanus/tdap  04/03/2021    The following portions of the patient's history were reviewed and updated as appropriate: allergies, current medications, past family history, past medical history, past social history, past surgical history and problem list.  Review of Systems A comprehensive review of systems was negative.   Objective:    BP 118/65  Pulse 71  Wt 165 lb (74.844 kg) General appearance: alert, cooperative and appears stated age Head: Normocephalic,  without obvious abnormality, atraumatic Eyes: conjunctivae/corneas clear. PERRL, EOM's intact. Fundi benign. Ears: normal TM's and external ear canals both ears Nose: Nares normal. Septum midline. Mucosa normal. No drainage or sinus tenderness. Throat: lips, mucosa, and tongue normal; teeth and gums normal Neck: no adenopathy, no carotid bruit, no JVD, supple, symmetrical, trachea midline and thyroid not enlarged, symmetric, no tenderness/mass/nodules Back: symmetric, no curvature. ROM normal. No CVA tenderness. Lungs: clear to auscultation bilaterally Heart: regular rate and rhythm, S1, S2 normal, no murmur, click, rub or gallop Abdomen: soft, non-tender; bowel sounds normal; no masses,  no organomegaly Extremities: extremities normal, atraumatic, no cyanosis or edema Pulses: 2+ and symmetric Skin: skin color, texture, tugor normal. irritated sebaceous hyperplasia on the right mid back.  Lymph nodes: Cervical, supraclavicular, and axillary nodes normal. Neurologic: Grossly normal    Assessment:    Healthy female exam.      Plan:    CPE/hyperlipidemia- Pap smear and mammogram up to date and done earlier this year. Vaccines up to date. Labs to recheck lipid were sent as well as diabetic screening labs. Discussed regular exercise, calcium/vitamin D daily.   Sebaceous hyperplasia- seems irritated. Make follow up appt to have removed.   Carpel tunnel right wrist- continue to wear brace at night, rest hands, take mobic as needed. Follow up in 2-4 weeks and we can get schedule for injection  to try.   Anxiety/headache/depression-PHQ-9 was 2. GAD-7 was 7.  increased anxiety could be causing HA. Right now I would like to keep all meds the same and pt look into counseling. Pt is going through some family changes with her children growing up. She feels lost. I think talking it out will help. Follow up in 4 weeks to discuss anxiety.pt thinks zoloft causing HA. I would like for pt not to make any  changes to meds at this point. She can switch to taking at night instead of in the morning.  See After Visit Summary for Counseling Recommendations

## 2013-09-19 ENCOUNTER — Other Ambulatory Visit: Payer: Self-pay | Admitting: Physician Assistant

## 2013-09-25 ENCOUNTER — Ambulatory Visit (INDEPENDENT_AMBULATORY_CARE_PROVIDER_SITE_OTHER): Payer: Managed Care, Other (non HMO) | Admitting: Physician Assistant

## 2013-09-25 ENCOUNTER — Encounter: Payer: Self-pay | Admitting: Physician Assistant

## 2013-09-25 VITALS — BP 118/69 | HR 101 | Temp 98.2°F | Wt 166.0 lb

## 2013-09-25 DIAGNOSIS — R109 Unspecified abdominal pain: Secondary | ICD-10-CM

## 2013-09-25 DIAGNOSIS — R112 Nausea with vomiting, unspecified: Secondary | ICD-10-CM

## 2013-09-25 DIAGNOSIS — R6889 Other general symptoms and signs: Secondary | ICD-10-CM

## 2013-09-25 DIAGNOSIS — A084 Viral intestinal infection, unspecified: Secondary | ICD-10-CM

## 2013-09-25 DIAGNOSIS — A088 Other specified intestinal infections: Secondary | ICD-10-CM

## 2013-09-25 DIAGNOSIS — R509 Fever, unspecified: Secondary | ICD-10-CM

## 2013-09-25 MED ORDER — PROMETHAZINE HCL 25 MG/ML IJ SOLN
25.0000 mg | Freq: Once | INTRAMUSCULAR | Status: AC
  Administered 2013-09-25: 25 mg via INTRAMUSCULAR

## 2013-09-25 MED ORDER — PROMETHAZINE HCL 12.5 MG PO TABS: 12.5000 mg | ORAL_TABLET | Freq: Four times a day (QID) | ORAL | Status: DC | PRN

## 2013-09-25 MED ORDER — DICYCLOMINE HCL 10 MG PO CAPS: 10.0000 mg | ORAL_CAPSULE | Freq: Three times a day (TID) | ORAL | Status: DC

## 2013-09-25 MED ORDER — ONDANSETRON HCL 4 MG PO TABS: 4.0000 mg | ORAL_TABLET | Freq: Three times a day (TID) | ORAL | Status: DC | PRN

## 2013-09-25 NOTE — Patient Instructions (Signed)
Viral Gastroenteritis  Viral gastroenteritis is also known as stomach flu. This condition affects the stomach and intestinal tract. It can cause sudden diarrhea and vomiting. The illness typically lasts 3 to 8 days. Most people develop an immune response that eventually gets rid of the virus. While this natural response develops, the virus can make you quite ill.  CAUSES   Many different viruses can cause gastroenteritis, such as rotavirus or noroviruses. You can catch one of these viruses by consuming contaminated food or water. You may also catch a virus by sharing utensils or other personal items with an infected person or by touching a contaminated surface.  SYMPTOMS   The most common symptoms are diarrhea and vomiting. These problems can cause a severe loss of body fluids (dehydration) and a body salt (electrolyte) imbalance. Other symptoms may include:  · Fever.  · Headache.  · Fatigue.  · Abdominal pain.  DIAGNOSIS   Your caregiver can usually diagnose viral gastroenteritis based on your symptoms and a physical exam. A stool sample may also be taken to test for the presence of viruses or other infections.  TREATMENT   This illness typically goes away on its own. Treatments are aimed at rehydration. The most serious cases of viral gastroenteritis involve vomiting so severely that you are not able to keep fluids down. In these cases, fluids must be given through an intravenous line (IV).  HOME CARE INSTRUCTIONS   · Drink enough fluids to keep your urine clear or pale yellow. Drink small amounts of fluids frequently and increase the amounts as tolerated.  · Ask your caregiver for specific rehydration instructions.  · Avoid:  · Foods high in sugar.  · Alcohol.  · Carbonated drinks.  · Tobacco.  · Juice.  · Caffeine drinks.  · Extremely hot or cold fluids.  · Fatty, greasy foods.  · Too much intake of anything at one time.  · Dairy products until 24 to 48 hours after diarrhea stops.  · You may consume probiotics.  Probiotics are active cultures of beneficial bacteria. They may lessen the amount and number of diarrheal stools in adults. Probiotics can be found in yogurt with active cultures and in supplements.  · Wash your hands well to avoid spreading the virus.  · Only take over-the-counter or prescription medicines for pain, discomfort, or fever as directed by your caregiver. Do not give aspirin to children. Antidiarrheal medicines are not recommended.  · Ask your caregiver if you should continue to take your regular prescribed and over-the-counter medicines.  · Keep all follow-up appointments as directed by your caregiver.  SEEK IMMEDIATE MEDICAL CARE IF:   · You are unable to keep fluids down.  · You do not urinate at least once every 6 to 8 hours.  · You develop shortness of breath.  · You notice blood in your stool or vomit. This may look like coffee grounds.  · You have abdominal pain that increases or is concentrated in one small area (localized).  · You have persistent vomiting or diarrhea.  · You have a fever.  · The patient is a child younger than 3 months, and he or she has a fever.  · The patient is a child older than 3 months, and he or she has a fever and persistent symptoms.  · The patient is a child older than 3 months, and he or she has a fever and symptoms suddenly get worse.  · The patient is a baby, and he   or she has no tears when crying.  MAKE SURE YOU:   · Understand these instructions.  · Will watch your condition.  · Will get help right away if you are not doing well or get worse.  Document Released: 09/20/2005 Document Revised: 12/13/2011 Document Reviewed: 07/07/2011  ExitCare® Patient Information ©2014 [employer].  Diet for Diarrhea, Adult  Frequent, runny stools (diarrhea) may be caused or worsened by food or drink. Diarrhea may be relieved by changing your diet. Since diarrhea can last up to 7 days, it is easy for you to lose too much fluid from the body and become dehydrated. Fluids that are  lost need to be replaced. Along with a modified diet, make sure you drink enough fluids to keep your urine clear or pale yellow.  DIET INSTRUCTIONS  · Ensure adequate fluid intake (hydration): have 1 cup (8 oz) of fluid for each diarrhea episode. Avoid fluids that contain simple sugars or sports drinks, fruit juices, whole milk products, and sodas. Your urine should be clear or pale yellow if you are drinking enough fluids. Hydrate with an oral rehydration solution that you can purchase at pharmacies, retail stores, and online. You can prepare an oral rehydration solution at home by mixing the following ingredients together:  ·   tsp table salt.  · ¾ tsp baking soda.  ·  tsp salt substitute containing potassium chloride.  · 1  tablespoons sugar.  · 1 L (34 oz) of water.  · Certain foods and beverages may increase the speed at which food moves through the gastrointestinal (GI) tract. These foods and beverages should be avoided and include:  · Caffeinated and alcoholic beverages.  · High-fiber foods, such as raw fruits and vegetables, nuts, seeds, and whole grain breads and cereals.  · Foods and beverages sweetened with sugar alcohols, such as xylitol, sorbitol, and mannitol.  · Some foods may be well tolerated and may help thicken stool including:  · Starchy foods, such as rice, toast, pasta, low-sugar cereal, oatmeal, grits, baked potatoes, crackers, and bagels.    · Bananas.    · Applesauce.  · Add probiotic-rich foods to help increase healthy bacteria in the GI tract, such as yogurt and fermented milk products.  RECOMMENDED FOODS AND BEVERAGES  Starches  Choose foods with less than 2 g of fiber per serving.  · Recommended:  White, French, and pita breads, plain rolls, buns, bagels. Plain muffins, matzo. Soda, saltine, or graham crackers. Pretzels, melba toast, zwieback. Cooked cereals made with water: cornmeal, farina, cream cereals. Dry cereals: refined corn, wheat, rice. Potatoes prepared any way without skins,  refined macaroni, spaghetti, noodles, refined rice.  · Avoid:  Bread, rolls, or crackers made with whole wheat, multi-grains, rye, bran seeds, nuts, or coconut. Corn tortillas or taco shells. Cereals containing whole grains, multi-grains, bran, coconut, nuts, raisins. Cooked or dry oatmeal. Coarse wheat cereals, granola. Cereals advertised as "high-fiber." Potato skins. Whole grain pasta, wild or brown rice. Popcorn. Sweet potatoes, yams. Sweet rolls, doughnuts, waffles, pancakes, sweet breads.  Vegetables  · Recommended: Strained tomato and vegetable juices. Most well-cooked and canned vegetables without seeds. Fresh: Tender lettuce, cucumber without the skin, cabbage, spinach, bean sprouts.  · Avoid: Fresh, cooked, or canned: Artichokes, baked beans, beet greens, broccoli, Brussels sprouts, corn, kale, legumes, peas, sweet potatoes. Cooked: Green or red cabbage, spinach. Avoid large servings of any vegetables because vegetables shrink when cooked, and they contain more fiber per serving than fresh vegetables.  Fruit  · Recommended: Cooked   or canned: Apricots, applesauce, cantaloupe, cherries, fruit cocktail, grapefruit, grapes, kiwi, mandarin oranges, peaches, pears, plums, watermelon. Fresh: Apples without skin, ripe banana, grapes, cantaloupe, cherries, grapefruit, peaches, oranges, plums. Keep servings limited to ½ cup or 1 piece.  · Avoid: Fresh: Apples with skin, apricots, mangoes, pears, raspberries, strawberries. Prune juice, stewed or dried prunes. Dried fruits, raisins, dates. Large servings of all fresh fruits.  Protein  · Recommended: Ground or well-cooked tender beef, ham, veal, lamb, pork, or poultry. Eggs. Fish, oysters, shrimp, lobster, other seafoods. Liver, organ meats.  · Avoid: Tough, fibrous meats with gristle. Peanut butter, smooth or chunky. Cheese, nuts, seeds, legumes, dried peas, beans, lentils.  Dairy  · Recommended: Yogurt, lactose-free milk, kefir, drinkable yogurt, buttermilk, soy  milk, or plain hard cheese.  · Avoid: Milk, chocolate milk, beverages made with milk, such as milkshakes.  Soups  · Recommended: Bouillon, broth, or soups made from allowed foods. Any strained soup.  · Avoid: Soups made from vegetables that are not allowed, cream or milk-based soups.  Desserts and Sweets  · Recommended: Sugar-free gelatin, sugar-free frozen ice pops made without sugar alcohol.  · Avoid: Plain cakes and cookies, pie made with fruit, pudding, custard, cream pie. Gelatin, fruit, ice, sherbet, frozen ice pops. Ice cream, ice milk without nuts. Plain hard candy, honey, jelly, molasses, syrup, sugar, chocolate syrup, gumdrops, marshmallows.  Fats and Oils  · Recommended: Limit fats to less than 8 tsp per day.  · Avoid: Seeds, nuts, olives, avocados. Margarine, butter, cream, mayonnaise, salad oils, plain salad dressings. Plain gravy, crisp bacon without rind.  Beverages  · Recommended: Water, decaffeinated teas, oral rehydration solutions, sugar-free beverages not sweetened with sugar alcohols.  · Avoid: Fruit juices, caffeinated beverages (coffee, tea, soda), alcohol, sports drinks, or lemon-lime soda.  Condiments  · Recommended: Ketchup, mustard, horseradish, vinegar, cocoa powder. Spices in moderation: allspice, basil, bay leaves, celery powder or leaves, cinnamon, cumin powder, curry powder, ginger, mace, marjoram, onion or garlic powder, oregano, paprika, parsley flakes, ground pepper, rosemary, sage, savory, tarragon, thyme, turmeric.  · Avoid: Coconut, honey.  Document Released: 12/11/2003 Document Revised: 06/14/2012 Document Reviewed: 02/04/2012  ExitCare® Patient Information ©2014 [employer].

## 2013-09-25 NOTE — Progress Notes (Signed)
   Subjective:    Patient ID: Shirley Vasquez, female    DOB: 05-Jan-1966, 47 y.o.   MRN: [id number]  HPI Patient presents to the clinic with flu like symptoms that started last night. She did get her flu shot. Last night she had sever abdominal cramps with diarrhea. She stayed on the toliet all night until 7:30 this am. She has not had diarrhea or bowel movement since 7:30 this am. She feels very weak and tired. She did have fever, chills, nausea. No vomiting. No blood or mucus in stools. She does not remember eating anything different or raw last night. Her whole body aches. Tylenol is not really helping. Her daughter had similar symptoms 3 days ago.    Review of Systems     Objective:   Physical Exam  Constitutional: She is oriented to person, place, and time. She appears well-developed and well-nourished.  HENT:  Head: Normocephalic and atraumatic.  Right Ear: External ear normal.  Left Ear: External ear normal.  Nose: Nose normal.  Mouth/Throat: Oropharynx is clear and moist. No oropharyngeal exudate.  Eyes: Conjunctivae are normal. Right eye exhibits no discharge. Left eye exhibits no discharge.  Neck: Normal range of motion. Neck supple.  Cardiovascular: Regular rhythm and normal heart sounds.   Tachycardia at 101.   Pulmonary/Chest: Effort normal and breath sounds normal. She has no wheezes.  Abdominal: Soft.  Generalized tenderness over entire abdomen. Hyperactive bowel sounds.   Lymphadenopathy:    She has no cervical adenopathy.  Neurological: She is alert and oriented to person, place, and time.  Skin: Skin is warm and dry.  Psychiatric: She has a normal mood and affect. Her behavior is normal.          Assessment & Plan:  Flu like symptoms/abdominal pain/diarrhea/viral gastroenteritis- Influenza A and B negative. Shot of phenergan was given 25mg  IM. Gave HO for BRAT diet. zofran for ongoing nausea. Continue to use tylenol for pain and bentyl for abdominal spasms.  REst and make sure staying hydrated. Call if worsening.

## 2013-09-25 NOTE — Addendum Note (Signed)
Addended by: Donne Anon L on: 09/25/2013 03:14 PM   Modules accepted: Orders

## 2013-10-01 ENCOUNTER — Ambulatory Visit: Payer: Managed Care, Other (non HMO) | Admitting: Physician Assistant

## 2013-10-03 ENCOUNTER — Ambulatory Visit (INDEPENDENT_AMBULATORY_CARE_PROVIDER_SITE_OTHER): Payer: Managed Care, Other (non HMO) | Admitting: Physician Assistant

## 2013-10-03 ENCOUNTER — Encounter: Payer: Self-pay | Admitting: Physician Assistant

## 2013-10-03 VITALS — BP 109/60 | HR 65 | Wt 161.0 lb

## 2013-10-03 DIAGNOSIS — D225 Melanocytic nevi of trunk: Secondary | ICD-10-CM

## 2013-10-03 DIAGNOSIS — K219 Gastro-esophageal reflux disease without esophagitis: Secondary | ICD-10-CM

## 2013-10-03 DIAGNOSIS — G5600 Carpal tunnel syndrome, unspecified upper limb: Secondary | ICD-10-CM | POA: Insufficient documentation

## 2013-10-03 DIAGNOSIS — F419 Anxiety disorder, unspecified: Secondary | ICD-10-CM

## 2013-10-03 DIAGNOSIS — D235 Other benign neoplasm of skin of trunk: Secondary | ICD-10-CM

## 2013-10-03 DIAGNOSIS — G5601 Carpal tunnel syndrome, right upper limb: Secondary | ICD-10-CM

## 2013-10-03 DIAGNOSIS — F411 Generalized anxiety disorder: Secondary | ICD-10-CM

## 2013-10-03 LAB — LIPID PANEL
Cholesterol: 168 mg/dL (ref 0–200)
HDL: 52 mg/dL (ref >39)
Total CHOL/HDL Ratio: 3.2 Ratio
Triglycerides: 67 mg/dL (ref <150)

## 2013-10-03 LAB — COMPLETE METABOLIC PANEL WITH GFR
Alkaline Phosphatase: 47 U/L (ref 39–117)
BUN: 16 mg/dL (ref 6–23)
Creat: 0.81 mg/dL (ref 0.50–1.10)
GFR, Est African American: >89 mL/min
GFR, Est Non African American: 86 mL/min
Glucose, Bld: 81 mg/dL (ref 70–99)
Sodium: 137 mEq/L (ref 135–145)
Total Bilirubin: 0.4 mg/dL (ref 0.3–1.2)

## 2013-10-03 MED ORDER — ESOMEPRAZOLE MAGNESIUM 40 MG PO CPDR: 40.0000 mg | DELAYED_RELEASE_CAPSULE | Freq: Every day | ORAL | Status: DC

## 2013-10-03 MED ORDER — DICYCLOMINE HCL 10 MG PO CAPS: 10.0000 mg | ORAL_CAPSULE | Freq: Three times a day (TID) | ORAL | Status: DC

## 2013-10-03 NOTE — Progress Notes (Signed)
   Subjective:    Patient ID: Shirley Vasquez, female    DOB: Apr 02, 1966, 47 y.o.   MRN: [id number]  HPI Pt presents to the clinic to follow up and get mole on back removed.   Carpel tunnel of right wrist is much improved with exercises, brace, rest, mobic and icing. She is currently having no problems.   Irritated nevus from bra strap. Would like removed today. Does seem to be increasing in size. Will bleed from time to time with trauma. No personal or family hx of melanonma. Clear mole with a central area of hyperpigmentation.   Anxiety- Pt is doing much better with anxiety. She is seeing a counselor weekly and has helped a lot. She continues to take zoloft and use klonapin as needed. She does not use every day. Only uses when feels out of control or really anxious. She is taking care of her sick dad and she will take it when she has to deal with him.   Pt had gastroenteritis last week. Bentyl was given for stomach pains/cramps. She is still having some residual cramps. She would like refill today. Denies any n/v/d.   GERD-controlled with nexium.    Review of Systems     Objective:   Physical Exam  Constitutional: She is oriented to person, place, and time. She appears well-developed and well-nourished.  HENT:  Head: Normocephalic and atraumatic.  Cardiovascular: Normal rate, regular rhythm and normal heart sounds.   Pulmonary/Chest: Effort normal and breath sounds normal.  Abdominal: Soft. Bowel sounds are normal. There is no tenderness.  Neurological: She is alert and oriented to person, place, and time.  Skin:             Assessment & Plan:  Carpel tunnel of right wrist- doing well discussed to complete treatment course as above for another flare up. If not improving call for shot.   Anxiety- continue on treatment plan above. I am glad counseling is helping so much. Use klonapin as needed. Call if need refill.   Abdominal cramps- refilled bentyl. Likely having some  cramps after viral gastroenteritis. Refilled to use prn. Call if continue.   GERD- refilled nexium capsules.   Irritated nevus-Shave Biopsy Procedure Note  Pre-operative Diagnosis: irritated nevus  Post-operative Diagnosis: same  Locations:right mid back at bra line  Indications: irriated and bleeding/seemed to be growing did have an area of hypopigmentation.  Anesthesia: Lidocaine 2% without epinephrine without added sodium bicarbonate  Procedure Details  History of allergy to iodine: no  Patient informed of the risks (including bleeding and infection) and benefits of the  procedure and Verbal informed consent obtained.  The lesion and surrounding area were given a sterile prep using betadyne and draped in the usual sterile fashion. A scalpel was used to shave an area of skin approximately 5mm by 4mm.  Hemostasis achieved with alumuninum chloride. Antibiotic ointment and a sterile dressing applied.  The specimen was sent for pathologic examination. The patient tolerated the procedure well.  EBL: scant  Findings:    Condition: Stable  Complications: none.  Plan: 1. Instructed to keep the wound dry and covered for 24-48h and clean thereafter. 2. Warning signs of infection were reviewed.   3. Recommended that the patient use OTC acetaminophen as needed for pain.

## 2013-12-12 ENCOUNTER — Other Ambulatory Visit: Payer: Self-pay | Admitting: Physician Assistant

## 2013-12-18 ENCOUNTER — Encounter: Payer: Self-pay | Admitting: *Deleted

## 2013-12-18 ENCOUNTER — Encounter: Payer: Self-pay | Admitting: Family Medicine

## 2013-12-18 ENCOUNTER — Ambulatory Visit (INDEPENDENT_AMBULATORY_CARE_PROVIDER_SITE_OTHER): Payer: Managed Care, Other (non HMO) | Admitting: Family Medicine

## 2013-12-18 VITALS — BP 129/82 | HR 99 | Temp 97.9°F | Wt 164.0 lb

## 2013-12-18 DIAGNOSIS — M7061 Trochanteric bursitis, right hip: Secondary | ICD-10-CM

## 2013-12-18 DIAGNOSIS — R300 Dysuria: Secondary | ICD-10-CM

## 2013-12-18 DIAGNOSIS — M76899 Other specified enthesopathies of unspecified lower limb, excluding foot: Secondary | ICD-10-CM

## 2013-12-18 LAB — POCT URINALYSIS DIPSTICK
Bilirubin, UA: NEGATIVE
Glucose, UA: NEGATIVE
Ketones, UA: NEGATIVE
Leukocytes, UA: NEGATIVE
Nitrite, UA: NEGATIVE
PH UA: 6
PROTEIN UA: NEGATIVE
SPEC GRAV UA: 1.02
Urobilinogen, UA: 0.2

## 2013-12-18 MED ORDER — CIPROFLOXACIN HCL 250 MG PO TABS
ORAL_TABLET | ORAL | Status: AC
Start: 2013-12-18 — End: 2013-12-23

## 2013-12-18 NOTE — Progress Notes (Signed)
CC: Shirley Vasquez is a 48 y.o. female is here for Urinary Tract Infection   Subjective: HPI:  Complains of dysuria that has been present for the last 2 weeks comes and goes a daily basis mild to absent in severity. Accompanied by hesitancy over the past 3 days moderate in severity. Has not improved with increasing fluids or AZO.  Present all hours of the day. Burning is localized to the urethra. Present only when urinating. Denies fevers, chills, abdominal pain, pelvic pain, flank pain nor nausea  Complains of hip pain is localized in the right hip that has been present also for 2 weeks. Very well localized over the greater trochanter without any recent trauma or overexertion. Symptoms are worse when pressing on this region or when trying to scoot across the floor or bench in a lateral fashion. No interventions as of yet. Moderate in severity improves with resting.  Groin pain, back pain, nor leg pain other than that described above   Review Of Systems Outlined In HPI  Past Medical History  Diagnosis Date  . Anxiety   . Fibroid     Uterine ablation    Past Surgical History  Procedure Laterality Date  . Cholecystectomy    . Bladder suspension    . Tonsillectomy and adenoidectomy    . Tubal ligation    . Uternine ablasion    . Wisdom tooth extraction     Family History  Problem Relation Age of Onset  . Cancer Mother     breast  . Heart failure Mother   . Hypertension Mother     History   Social History  . Marital Status: Married    Spouse Name: N/A    Number of Children: N/A  . Years of Education: N/A   Occupational History  . Not on file.   Social History Main Topics  . Smoking status: Never Smoker   . Smokeless tobacco: Not on file  . Alcohol Use: No  . Drug Use: No  . Sexual Activity: Not on file   Other Topics Concern  . Not on file   Social History Narrative  . No narrative on file     Objective: BP 129/82  Pulse 99  Temp(Src) 97.9 F (36.6 C)  (Oral)  Wt 164 lb (74.39 kg)  Vital signs reviewed. General: Alert and Oriented, No Acute Distress HEENT: Pupils equal, round, reactive to light. Conjunctivae clear.  External ears unremarkable.  Moist mucous membranes. Lungs: Clear and comfortable work of breathing, speaking in full sentences without accessory muscle use. Cardiac: Regular rate and rhythm.  Extremities: No peripheral edema.  Strong peripheral pulses. Full range of motion and strength of the right lower extremity. Pain is 100% reproduced when right greater trochanter is palpated. Mental Status: No depression, anxiety, nor agitation. Logical though process. Skin: Warm and dry. Assessment & Plan: Shirley Vasquez was seen today for urinary tract infection.  Diagnoses and associated orders for this visit:  Dysuria - Urinalysis Dipstick - Urine Culture - ciprofloxacin (CIPRO) 250 MG tablet; Take one by mouth twice a day for five days.  Trochanteric bursitis of right hip    Dysuria: Urinalysis with blood therefore start Cipro we'll follow culture Trochanteric bursitis right hip: Provided with home exercise routine for the next 3 weeks to be completed on a a daily basis. If no better in 3 weeks return to see me or PCP for consideration of steroid injection  Return if symptoms worsen or fail to improve.

## 2013-12-20 LAB — URINE CULTURE
Colony Count: NO GROWTH
Organism ID, Bacteria: NO GROWTH

## 2014-01-25 ENCOUNTER — Ambulatory Visit (INDEPENDENT_AMBULATORY_CARE_PROVIDER_SITE_OTHER): Payer: Managed Care, Other (non HMO) | Admitting: Family Medicine

## 2014-01-25 ENCOUNTER — Ambulatory Visit: Payer: Managed Care, Other (non HMO) | Admitting: Family Medicine

## 2014-01-25 ENCOUNTER — Encounter: Payer: Self-pay | Admitting: Family Medicine

## 2014-01-25 VITALS — BP 141/83 | HR 95 | Wt 168.0 lb

## 2014-01-25 DIAGNOSIS — M7061 Trochanteric bursitis, right hip: Secondary | ICD-10-CM

## 2014-01-25 DIAGNOSIS — M76899 Other specified enthesopathies of unspecified lower limb, excluding foot: Secondary | ICD-10-CM

## 2014-01-25 NOTE — Progress Notes (Signed)
CC: Shirley Vasquez is a 48 y.o. female is here for right hip pain   Subjective: HPI:  Right hip pain that has been present for the past 8 weeks slightly improving over the past 4 weeks since starting stretching and rehabilitation exercises at home. Is described as a soreness which is nonradiating, worse with cycling, mowing the lawn, walking, or pressing on the lateral hip. Symptoms are moderate in severity, improved with rest.  Denies any groin pain, overlying skin changes at the site of pain, radiation of pain, knee or back pain.   Review Of Systems Outlined In HPI  Past Medical History  Diagnosis Date  . Anxiety   . Fibroid     Uterine ablation    Past Surgical History  Procedure Laterality Date  . Cholecystectomy    . Bladder suspension    . Tonsillectomy and adenoidectomy    . Tubal ligation    . Uternine ablasion    . Wisdom tooth extraction     Family History  Problem Relation Age of Onset  . Cancer Mother     breast  . Heart failure Mother   . Hypertension Mother     History   Social History  . Marital Status: Married    Spouse Name: N/A    Number of Children: N/A  . Years of Education: N/A   Occupational History  . Not on file.   Social History Main Topics  . Smoking status: Never Smoker   . Smokeless tobacco: Not on file  . Alcohol Use: No  . Drug Use: No  . Sexual Activity: Not on file   Other Topics Concern  . Not on file   Social History Narrative  . No narrative on file     Objective: BP 141/83  Pulse 95  Wt 168 lb (76.204 kg)  General: Alert and Oriented, No Acute Distress HEENT: Pupils equal, round, reactive to light. Conjunctivae clear.  Moist mucous membranes Lungs: Clear and comfortable work of breathing Extremities: No peripheral edema.  Strong peripheral pulses. Straight leg raise is negative on the right, Corky Sox on the right is negative, no pain with femoral grind, no pain with log roll of the right leg, pain is 100% reproduced  with pressure over the greater trochanter on the right. Mental Status: No depression, anxiety, nor agitation. Skin: Warm and dry.  Assessment & Plan: Shirley Vasquez was seen today for right hip pain.  Diagnoses and associated orders for this visit:  Trochanteric bursitis of right hip    Patient desires steroid injection discussed continuing rehabilitation exercises and stretching at home for the next week.  After verbal consent was obtained the skin overlying the right greater trochanter was cleaned with alcohol. Using a 23-gauge and one and one half inch needle 3 mL of 1% lidocaine and 2 mL of Kenalog (40 mg/mL) were injected into the greater trochanteric bursa after aspiration did not remove any fluid. No bleeding, Band-Aid was applied. Patient tolerated procedure well without complications  Return if symptoms worsen or fail to improve.

## 2014-02-15 ENCOUNTER — Telehealth: Payer: Self-pay

## 2014-02-15 ENCOUNTER — Encounter: Payer: Self-pay | Admitting: Family Medicine

## 2014-02-15 ENCOUNTER — Ambulatory Visit (INDEPENDENT_AMBULATORY_CARE_PROVIDER_SITE_OTHER): Payer: Managed Care, Other (non HMO) | Admitting: Family Medicine

## 2014-02-15 VITALS — BP 139/85 | HR 83 | Wt 167.0 lb

## 2014-02-15 DIAGNOSIS — M7061 Trochanteric bursitis, right hip: Secondary | ICD-10-CM

## 2014-02-15 DIAGNOSIS — M25551 Pain in right hip: Secondary | ICD-10-CM

## 2014-02-15 DIAGNOSIS — M76899 Other specified enthesopathies of unspecified lower limb, excluding foot: Secondary | ICD-10-CM

## 2014-02-15 DIAGNOSIS — M25559 Pain in unspecified hip: Secondary | ICD-10-CM

## 2014-02-15 MED ORDER — DICLOFENAC EPOLAMINE 1.3 % TD PTCH: 1.0000 | MEDICATED_PATCH | Freq: Two times a day (BID) | TRANSDERMAL | Status: DC

## 2014-02-15 NOTE — Telephone Encounter (Signed)
The Flector patch has given her pain relief. The medication copay is $416.00 is there anything cheaper. She is very grateful with the care she received by Dr Ileene Rubens and Seth Bake.

## 2014-02-15 NOTE — Progress Notes (Signed)
CC: Shirley Vasquez is a 48 y.o. female is here for right hip pain   Subjective: HPI:  Followup right hip pain: Patient states that she had 100% resolution of pain for 5 days after a steroid injection to the greater trochanteric bursa last month. Symptoms have gradually been returning to baseline. Currently moderate in severity, localized to the lateral hip, radiating down the lateral aspect of the thigh. Described as a burning, soreness, pressure. Worse when trying to get up from a seated position or when pressure is applied to the greater trochanteric region.  No longer responding to ibuprofen. She continues to do rehabilitation exercises at home. Denies fevers, chills, flank pain, back pain, groin pain, overlying skin changes, weakness nor motor or sensory disturbances   Review Of Systems Outlined In HPI  Past Medical History  Diagnosis Date  . Anxiety   . Fibroid     Uterine ablation    Past Surgical History  Procedure Laterality Date  . Cholecystectomy    . Bladder suspension    . Tonsillectomy and adenoidectomy    . Tubal ligation    . Uternine ablasion    . Wisdom tooth extraction     Family History  Problem Relation Age of Onset  . Cancer Mother     breast  . Heart failure Mother   . Hypertension Mother     History   Social History  . Marital Status: Married    Spouse Name: N/A    Number of Children: N/A  . Years of Education: N/A   Occupational History  . Not on file.   Social History Main Topics  . Smoking status: Never Smoker   . Smokeless tobacco: Not on file  . Alcohol Use: No  . Drug Use: No  . Sexual Activity: Not on file   Other Topics Concern  . Not on file   Social History Narrative  . No narrative on file     Objective: BP 139/85  Pulse 83  Wt 167 lb (75.751 kg)  General: Alert and Oriented, No Acute Distress HEENT: Pupils equal, round, reactive to light. Conjunctivae clear.  Moist membranes pharynx unremarkable Lungs: Clear  comfortable work of breathing Cardiac: Regular rate and rhythm.  Extremities: No peripheral edema.  Strong peripheral pulses. Full range of motion strength in both lower extremities, straight leg raise negative bilaterally. Pain is reproduced with palpation of right greater trochanter. No pain with log roll of the right leg. Evaluation of the right leg shows negative femoral grind, negative log roll, negative FADIR, FABER reproduces pain. Mental Status: No depression, anxiety, nor agitation. Skin: Warm and dry.  Assessment & Plan: [place] was seen today for right hip pain.  Diagnoses and associated orders for this visit:  Trochanteric bursitis of right hip - diclofenac (FLECTOR) 1.3 % PTCH; Place 1 patch onto the skin 2 (two) times daily. - Ambulatory referral to Physical Therapy  Right hip pain - DG Hip Complete Right; Future    Trochanteric bursitis of the right hip: Uncontrolled, she is requesting another steroid injection however advised her that it would be best to wait a full 3 months until this is performed again. I've encouraged her to engage in formal physical therapy and to try Flector patch replaced every 12 hours. If she gets benefit from this topical preparation of the week and will provide her with a normal prescription. Obtain x-ray to rule out more serious orthopedic pathology contributing to her pain.  25 minutes spent face-to-face during  visit today of which at least 50% was counseling or coordinating care regarding: 1. Trochanteric bursitis of right hip   2. Right hip pain       Return in about 4 weeks (around 03/15/2014).

## 2014-02-15 NOTE — Telephone Encounter (Signed)
Thank you :)

## 2014-02-15 NOTE — Telephone Encounter (Signed)
Shirley Vasquez called and wanted to thank Seth Bake for her compassion and for all her help this morning.

## 2014-02-15 NOTE — Telephone Encounter (Signed)
Seth Bake, Would you please let the patient know that the pharmaceutical representative that provides [place] with the samples tells me that if she uses the savings card I provided her with she can get this prescription for only $20. What she would have to do is go to a pharmacy that does not have her insurance information and ask for the prescription to be ran as a cash pay prescription using the savings card. I can't promise her that this will work but it's worth a shot, I will leave a new written prescription (in your box) that she continues to try this.

## 2014-02-18 ENCOUNTER — Telehealth: Payer: Self-pay | Admitting: *Deleted

## 2014-02-18 ENCOUNTER — Other Ambulatory Visit: Payer: Self-pay | Admitting: Physician Assistant

## 2014-02-18 ENCOUNTER — Encounter: Payer: Self-pay | Admitting: *Deleted

## 2014-02-18 ENCOUNTER — Ambulatory Visit
Admission: RE | Admit: 2014-02-18 | Discharge: 2014-02-18 | Disposition: A | Discharge status: Home / Self Care | Payer: Managed Care, Other (non HMO) | Source: Ambulatory Visit | Attending: Family Medicine | Admitting: Family Medicine

## 2014-02-18 ENCOUNTER — Encounter (INDEPENDENT_AMBULATORY_CARE_PROVIDER_SITE_OTHER): Payer: Self-pay

## 2014-02-18 DIAGNOSIS — M25551 Pain in right hip: Secondary | ICD-10-CM

## 2014-02-18 MED ORDER — MELOXICAM 7.5 MG PO TABS: ORAL_TABLET | ORAL | Status: DC

## 2014-02-18 NOTE — Telephone Encounter (Signed)
Pt notified and she will call me back and let me know which pharm she wants me to send rx to

## 2014-02-18 NOTE — Telephone Encounter (Signed)
Call pt: sent mobic to take daily as needed for hip pain.

## 2014-02-18 NOTE — Telephone Encounter (Signed)
Pt called stating that her hip pain is constant now.  Dr. Ileene Rubens gave her flector patches but her payment after ins is over $400.  Dr. Lemmie Evens has the paper the pharm sent back with the amount.  Pt said you had mentioned an anti-inflammatory last week when she was in the office & she's wanting to know if you can send that in for her.

## 2014-02-18 NOTE — Telephone Encounter (Signed)
Pt notified of rx. 

## 2014-03-01 ENCOUNTER — Encounter: Payer: Self-pay | Admitting: Physician Assistant

## 2014-03-01 ENCOUNTER — Ambulatory Visit (INDEPENDENT_AMBULATORY_CARE_PROVIDER_SITE_OTHER): Payer: Managed Care, Other (non HMO) | Admitting: Physician Assistant

## 2014-03-01 VITALS — BP 133/82 | HR 97 | Ht 64.0 in | Wt 168.0 lb

## 2014-03-01 DIAGNOSIS — J302 Other seasonal allergic rhinitis: Secondary | ICD-10-CM

## 2014-03-01 DIAGNOSIS — J040 Acute laryngitis: Secondary | ICD-10-CM

## 2014-03-01 DIAGNOSIS — J309 Allergic rhinitis, unspecified: Secondary | ICD-10-CM

## 2014-03-01 DIAGNOSIS — R0989 Other specified symptoms and signs involving the circulatory and respiratory systems: Secondary | ICD-10-CM

## 2014-03-01 DIAGNOSIS — R6889 Other general symptoms and signs: Secondary | ICD-10-CM

## 2014-03-01 DIAGNOSIS — R0982 Postnasal drip: Secondary | ICD-10-CM

## 2014-03-01 MED ORDER — ALBUTEROL SULFATE HFA 108 (90 BASE) MCG/ACT IN AERS: 2.0000 | INHALATION_SPRAY | Freq: Four times a day (QID) | RESPIRATORY_TRACT | Status: DC | PRN

## 2014-03-04 ENCOUNTER — Other Ambulatory Visit: Payer: Self-pay | Admitting: Physician Assistant

## 2014-03-04 ENCOUNTER — Telehealth: Payer: Self-pay | Admitting: *Deleted

## 2014-03-04 MED ORDER — MONTELUKAST SODIUM 10 MG PO TABS: 10.0000 mg | ORAL_TABLET | Freq: Every day | ORAL | Status: DC

## 2014-03-04 MED ORDER — METHYLPREDNISOLONE (PAK) 4 MG PO TABS: ORAL_TABLET | ORAL | Status: DC

## 2014-03-04 NOTE — Telephone Encounter (Signed)
Pt called this morning & stated that the 2 prescriptions you were going to send fri never made it to the pharm.

## 2014-03-04 NOTE — Telephone Encounter (Signed)
Pt notified of rx. 

## 2014-03-04 NOTE — Progress Notes (Addendum)
   Subjective:    Patient ID: Shirley Vasquez, female    DOB: 11-11-1965, 48 y.o.   MRN: [id number]  HPI Pt presents to the clinic with loss of voice, nasal congestion for last week or so. She has a dry cough with no production but more like a clearing of throat all the time. Denies any ear pain, ST, fever, chills, SOB, wheezing. Taking claritin D and sucking on cough drops but not tried anything else. Does not seem to be helping. Has hx of acid reflux and taking nexium daily. Does not particularly notice any worsening of acid reflux symptoms.    Review of Systems  All other systems reviewed and are negative.      Objective:   Physical Exam  Constitutional: She is oriented to person, place, and time. She appears well-developed and well-nourished.  HENT:  Head: Normocephalic and atraumatic.  Right Ear: External ear normal.  Left Ear: External ear normal.  TM's clear bilaterally.  Oropharynx erythematous no tonsilar exudate or swelling. PND present.   Turbinates bilaterally mildly swollen and red.   No sinus pressure to palpation.   Eyes: Conjunctivae are normal. Right eye exhibits no discharge. Left eye exhibits no discharge.  Neck: Normal range of motion. Neck supple.  Cardiovascular: Normal rate, regular rhythm and normal heart sounds.   Pulmonary/Chest: Effort normal and breath sounds normal. She has no wheezes.  Lymphadenopathy:    She has no cervical adenopathy.  Neurological: She is alert and oriented to person, place, and time.  Skin: Skin is dry.  Psychiatric: She has a normal mood and affect. Her behavior is normal.          Assessment & Plan:  Allergic rhinitis/seasonal allergies/clearing of throat/larygnigitis/PNd- unclear etiology. GERD vs Viral vs chronic allergies or some combination. Continue claritin D and flonase. Added singulair daily. Medrol dose pk given. If not improving in next 4-5 days increase nexium to twice a day for another 5-7 if continues call  office and will get an ENT referral. If suddenly worsening or has a fever please call office sooner.

## 2014-03-04 NOTE — Telephone Encounter (Signed)
Sent again. Call if not able to pick up.

## 2014-03-05 ENCOUNTER — Telehealth: Payer: Self-pay

## 2014-03-05 NOTE — Telephone Encounter (Signed)
Awwwww!!  How sweet!

## 2014-03-05 NOTE — Telephone Encounter (Signed)
Shirley Vasquez left a message on the nurse voice mail. She wanted to thank Amber for all of her help on Monday. She is very thankful and wants you to have a great day!

## 2014-03-13 ENCOUNTER — Other Ambulatory Visit: Payer: Self-pay | Admitting: Physician Assistant

## 2014-03-24 ENCOUNTER — Encounter (HOSPITAL_BASED_OUTPATIENT_CLINIC_OR_DEPARTMENT_OTHER): Payer: Self-pay | Admitting: Emergency Medicine

## 2014-03-24 ENCOUNTER — Emergency Department (HOSPITAL_BASED_OUTPATIENT_CLINIC_OR_DEPARTMENT_OTHER)
Admission: EM | Admit: 2014-03-24 | Discharge: 2014-03-24 | Disposition: A | Discharge status: Home / Self Care | Payer: Managed Care, Other (non HMO) | Attending: Emergency Medicine | Admitting: Emergency Medicine

## 2014-03-24 DIAGNOSIS — M76899 Other specified enthesopathies of unspecified lower limb, excluding foot: Secondary | ICD-10-CM | POA: Insufficient documentation

## 2014-03-24 DIAGNOSIS — Z880 Allergy status to penicillin: Secondary | ICD-10-CM | POA: Insufficient documentation

## 2014-03-24 DIAGNOSIS — Z791 Long term (current) use of non-steroidal anti-inflammatories (NSAID): Secondary | ICD-10-CM | POA: Insufficient documentation

## 2014-03-24 DIAGNOSIS — Z8742 Personal history of other diseases of the female genital tract: Secondary | ICD-10-CM | POA: Insufficient documentation

## 2014-03-24 DIAGNOSIS — K219 Gastro-esophageal reflux disease without esophagitis: Secondary | ICD-10-CM | POA: Insufficient documentation

## 2014-03-24 DIAGNOSIS — IMO0002 Reserved for concepts with insufficient information to code with codable children: Secondary | ICD-10-CM | POA: Insufficient documentation

## 2014-03-24 DIAGNOSIS — M7061 Trochanteric bursitis, right hip: Secondary | ICD-10-CM

## 2014-03-24 DIAGNOSIS — J45909 Unspecified asthma, uncomplicated: Secondary | ICD-10-CM | POA: Insufficient documentation

## 2014-03-24 DIAGNOSIS — F411 Generalized anxiety disorder: Secondary | ICD-10-CM | POA: Insufficient documentation

## 2014-03-24 DIAGNOSIS — Z79899 Other long term (current) drug therapy: Secondary | ICD-10-CM | POA: Insufficient documentation

## 2014-03-24 HISTORY — DX: Unspecified asthma, uncomplicated: J45.909

## 2014-03-24 HISTORY — DX: Gastro-esophageal reflux disease without esophagitis: K21.9

## 2014-03-24 MED ORDER — NAPROXEN 250 MG PO TABS
500.0000 mg | ORAL_TABLET | Freq: Once | ORAL | Status: AC
  Administered 2014-03-24: 500 mg via ORAL
  Filled 2014-03-24: qty 2

## 2014-03-24 MED ORDER — ONDANSETRON 8 MG PO TBDP: 8.0000 mg | ORAL_TABLET | Freq: Three times a day (TID) | ORAL | Status: DC | PRN

## 2014-03-24 MED ORDER — HYDROCODONE-ACETAMINOPHEN 5-325 MG PO TABS: 1.0000 | ORAL_TABLET | Freq: Four times a day (QID) | ORAL | Status: DC | PRN

## 2014-03-24 NOTE — ED Notes (Signed)
Pt reports hip pain since March that has been getting worse, seen for same by PCP.

## 2014-03-24 NOTE — ED Provider Notes (Signed)
CSN: [id number]     Arrival date & time 03/24/14  0025 History   First MD Initiated Contact with Patient 03/24/14 0251     Chief Complaint  Patient presents with  . Hip Pain     (Consider location/radiation/quality/duration/timing/severity/associated sxs/prior Treatment) HPI This is a 48 year old female with a several month history of right trochanteric bursitis. She has had it injected with steroids in the past. She has been taking meloxicam for it but states that the meloxicam gives her a headache and causes her to have palpitations. The pain acutely worsened yesterday evening to the point of being severe. It is located over the right greater trochanter extending proximally. It is worse with movement or palpation. It is subsequently improved with rest. She denies acute injury.  Past Medical History  Diagnosis Date  . Anxiety   . Fibroid     Uterine ablation  . GERD (gastroesophageal reflux disease)   . Asthma    Past Surgical History  Procedure Laterality Date  . Cholecystectomy    . Bladder suspension    . Tonsillectomy and adenoidectomy    . Tubal ligation    . Uternine ablasion    . Wisdom tooth extraction     Family History  Problem Relation Age of Onset  . Cancer Mother     breast  . Heart failure Mother   . Hypertension Mother    History  Substance Use Topics  . Smoking status: Never Smoker   . Smokeless tobacco: Not on file  . Alcohol Use: No   OB History   Grav Para Term Preterm Abortions TAB SAB Ect Mult Living                 Review of Systems  All other systems reviewed and are negative.   Allergies  Amoxicillin; Augmentin; Codeine; Hctz; and Sulfa antibiotics  Home Medications   Prior to Admission medications   Medication Sig Start Date End Date Taking? Authorizing Provider  albuterol (PROVENTIL HFA;VENTOLIN HFA) 108 (90 BASE) MCG/ACT inhaler Inhale 2 puffs into the lungs every 6 (six) hours as needed for wheezing. 03/01/14   Donella Stade,  PA-C  AMBULATORY NON FORMULARY MEDICATION Medication Name: Compression stockings Diagnosis: Bilateral ankle edema  Diagnosis Code: 782.3 03/17/12   Hali Marry, MD  clonazePAM (KLONOPIN) 1 MG tablet TAKE 1 TABLET BY MOUTH TWICE DAILY AS NEEDED    Jade L Breeback, PA-C  cyclobenzaprine (FLEXERIL) 10 MG tablet One half tab PO qHS, then increase gradually to one tab TID. 05/04/13   Silverio Decamp, MD  diclofenac (FLECTOR) 1.3 % PTCH Place 1 patch onto the skin 2 (two) times daily. 02/15/14   Marcial Pacas, DO  dicyclomine (BENTYL) 10 MG capsule Take 1 capsule (10 mg total) by mouth 4 (four) times daily -  before meals and at bedtime. 10/03/13   Jade L Breeback, PA-C  esomeprazole (NEXIUM) 40 MG capsule Take 1 capsule (40 mg total) by mouth daily at 12 noon. 10/03/13   Jade L Breeback, PA-C  fluticasone (FLONASE) 50 MCG/ACT nasal spray Place 2 sprays into the nose daily. 05/18/13   Jade L Breeback, PA-C  gabapentin (NEURONTIN) 100 MG capsule Take 1 capsule (100 mg total) by mouth at bedtime. 01/24/13   Jade L Breeback, PA-C  loratadine-pseudoephedrine (CLARITIN-D 24-HOUR) 10-240 MG per 24 hr tablet Take 1 tablet by mouth daily.    Historical Provider, MD  meloxicam (MOBIC) 7.5 MG tablet TAKE 1-2 TABLETS BY MOUTH AS NEEDED  FOR hip pain. 02/18/14   Donella Stade, PA-C  methylPREDNIsolone (MEDROL DOSPACK) 4 MG tablet follow package directions 03/04/14   Jade L Breeback, PA-C  montelukast (SINGULAIR) 10 MG tablet Take 1 tablet (10 mg total) by mouth at bedtime. 03/04/14   Jade L Breeback, PA-C  Multiple Vitamin (MULTIVITAMIN) tablet Take 1 tablet by mouth daily.      Historical Provider, MD  ondansetron (ZOFRAN) 4 MG tablet Take 1 tablet (4 mg total) by mouth every 8 (eight) hours as needed for nausea or vomiting. 09/25/13   Jade L Breeback, PA-C  promethazine (PHENERGAN) 12.5 MG tablet Take 1 tablet (12.5 mg total) by mouth every 6 (six) hours as needed for nausea or vomiting. 09/25/13   Jade L  Breeback, PA-C  sertraline (ZOLOFT) 100 MG tablet Take 1 and 1/2 tab daily. 01/24/13   Jade L Breeback, PA-C   BP 151/74  Pulse 88  Temp(Src) 97.7 F (36.5 C) (Oral)  Resp 18  SpO2 100%  Physical Exam General: Well-developed, well-nourished female in no acute distress; appearance consistent with age of record HENT: normocephalic; atraumatic Eyes: pupils equal, round and reactive to light; extraocular muscles intact Neck: supple Heart: regular rate and rhythm Lungs: clear to auscultation bilaterally Abdomen: soft; nondistended Extremities: No deformity; full range of motion; pulses normal; tenderness over the right greater trochanter, pain on range of motion of right hip Neurologic: Awake, alert and oriented; motor function intact in all extremities and symmetric; no facial droop Skin: Warm and dry Psychiatric: Normal mood and affect    ED Course  Procedures (including critical care time)  MDM  We will have her switch from meloxicam to naproxen sodium which she states she can take without difficulty. We will also try hydrocodone for acute exacerbations.    Wynetta Fines, MD 03/24/14 [phone]

## 2014-03-24 NOTE — Discharge Instructions (Signed)
Hip Bursitis Bursitis is a swelling and soreness (inflammation) of a fluid-filled sac (bursa). This sac overlies and protects the joints.  CAUSES   Injury.  Overuse of the muscles surrounding the joint.  Arthritis.  Gout.  Infection.  Cold weather.  Inadequate warm-up and conditioning prior to activities. The cause may not be known.  SYMPTOMS   Mild to severe irritation.  Tenderness and swelling over the outside of the hip.  Pain with motion of the hip.  If the bursa becomes infected, a fever may be present. Redness, tenderness, and warmth will develop over the hip. Symptoms usually lessen in 3 to 4 weeks with treatment, but can come back. TREATMENT If conservative treatment does not work, your caregiver may advise draining the bursa and injecting cortisone into the area. This may speed up the healing process. This may also be used as an initial treatment of choice. HOME CARE INSTRUCTIONS   Apply ice to the affected area for 15-20 minutes every 3 to 4 hours while awake for the first 2 days. Put the ice in a plastic bag and place a towel between the bag of ice and your skin.  Rest the painful joint as much as possible, but continue to put the joint through a normal range of motion at least 4 times per day. When the pain lessens, begin normal, slow movements and usual activities to help prevent stiffness of the hip.  Only take over-the-counter or prescription medicines for pain, discomfort, or fever as directed by your caregiver.  Use crutches to limit weight bearing on the hip joint, if advised.  Elevate your painful hip to reduce swelling. Use pillows for propping and cushioning your legs and hips.  Gentle massage may provide comfort and decrease swelling. SEEK IMMEDIATE MEDICAL CARE IF:   Your pain increases even during treatment, or you are not improving.  You have a fever.  You have heat and inflammation over the involved bursa.  You have any other questions or  concerns. MAKE SURE YOU:   Understand these instructions.  Will watch your condition.  Will get help right away if you are not doing well or get worse. Document Released: 03/12/2002 Document Revised: 12/13/2011 Document Reviewed: 10/09/2008 [employer] Patient Information 2015 [employer]. This information is not intended to replace advice given to you by your health care provider. Make sure you discuss any questions you have with your health care provider.  

## 2014-03-28 ENCOUNTER — Telehealth: Payer: Self-pay | Admitting: *Deleted

## 2014-03-28 NOTE — Telephone Encounter (Signed)
Pt left vm stating that she really isn't having much relief from her hip pain & would like to know what the next steps would be.

## 2014-04-01 ENCOUNTER — Encounter: Payer: Self-pay | Admitting: Physician Assistant

## 2014-04-01 ENCOUNTER — Ambulatory Visit (INDEPENDENT_AMBULATORY_CARE_PROVIDER_SITE_OTHER): Payer: Managed Care, Other (non HMO) | Admitting: Physician Assistant

## 2014-04-01 VITALS — BP 147/90 | HR 96 | Ht 64.0 in | Wt 172.0 lb

## 2014-04-01 DIAGNOSIS — M25551 Pain in right hip: Secondary | ICD-10-CM

## 2014-04-01 DIAGNOSIS — M7061 Trochanteric bursitis, right hip: Secondary | ICD-10-CM | POA: Insufficient documentation

## 2014-04-01 DIAGNOSIS — M25559 Pain in unspecified hip: Secondary | ICD-10-CM

## 2014-04-01 DIAGNOSIS — M7062 Trochanteric bursitis, left hip: Secondary | ICD-10-CM

## 2014-04-01 DIAGNOSIS — M76899 Other specified enthesopathies of unspecified lower limb, excluding foot: Secondary | ICD-10-CM

## 2014-04-01 MED ORDER — HYDROCODONE-ACETAMINOPHEN 5-325 MG PO TABS: 1.0000 | ORAL_TABLET | Freq: Four times a day (QID) | ORAL | Status: DC | PRN

## 2014-04-01 MED ORDER — AMBULATORY NON FORMULARY MEDICATION: Status: DC

## 2014-04-01 NOTE — Patient Instructions (Addendum)
Will get scheduled for PT.  Will get MRI scheduled.   Hip Bursitis Bursitis is a swelling and soreness (inflammation) of a fluid-filled sac (bursa). This sac overlies and protects the joints.  CAUSES   Injury.  Overuse of the muscles surrounding the joint.  Arthritis.  Gout.  Infection.  Cold weather.  Inadequate warm-up and conditioning prior to activities. The cause may not be known.  SYMPTOMS   Mild to severe irritation.  Tenderness and swelling over the outside of the hip.  Pain with motion of the hip.  If the bursa becomes infected, a fever may be present. Redness, tenderness, and warmth will develop over the hip. Symptoms usually lessen in 3 to 4 weeks with treatment, but can come back. TREATMENT If conservative treatment does not work, your caregiver may advise draining the bursa and injecting cortisone into the area. This may speed up the healing process. This may also be used as an initial treatment of choice. HOME CARE INSTRUCTIONS   Apply ice to the affected area for 15-20 minutes every 3 to 4 hours while awake for the first 2 days. Put the ice in a plastic bag and place a towel between the bag of ice and your skin.  Rest the painful joint as much as possible, but continue to put the joint through a normal range of motion at least 4 times per day. When the pain lessens, begin normal, slow movements and usual activities to help prevent stiffness of the hip.  Only take over-the-counter or prescription medicines for pain, discomfort, or fever as directed by your caregiver.  Use crutches to limit weight bearing on the hip joint, if advised.  Elevate your painful hip to reduce swelling. Use pillows for propping and cushioning your legs and hips.  Gentle massage may provide comfort and decrease swelling. SEEK IMMEDIATE MEDICAL CARE IF:   Your pain increases even during treatment, or you are not improving.  You have a fever.  You have heat and inflammation over  the involved bursa.  You have any other questions or concerns. MAKE SURE YOU:   Understand these instructions.  Will watch your condition.  Will get help right away if you are not doing well or get worse. Document Released: 03/12/2002 Document Revised: 12/13/2011 Document Reviewed: 10/09/2008 [facility] Patient Information 2015 [place], [place]. This information is not intended to replace advice given to you by your health care Jalan Fariss. Make sure you discuss any questions you have with your health care Sarahmarie Leavey.

## 2014-04-01 NOTE — Telephone Encounter (Signed)
Call pt: you have tried physical therapy? Has mobic helped? I would like for you to see Dr. Darene Lamer sports medicine doctor next to see he perhaps ultrasound guided injection might be appropriate. Please help pt to schedule.

## 2014-04-01 NOTE — Telephone Encounter (Signed)
Pt notified at office visit.

## 2014-04-02 ENCOUNTER — Other Ambulatory Visit: Payer: Self-pay

## 2014-04-02 DIAGNOSIS — M25551 Pain in right hip: Secondary | ICD-10-CM

## 2014-04-02 DIAGNOSIS — M7061 Trochanteric bursitis, right hip: Secondary | ICD-10-CM

## 2014-04-02 NOTE — Progress Notes (Signed)
   Subjective:    Patient ID: Shirley Vasquez, female    DOB: 04-29-1966, 48 y.o.   MRN: [id number]  HPI Patient presents to the clinic with ongoing right lateral hip pain. She's been seeking care at our office since April of this year. She reports today that she's actually had the pain for over a year. It has progressively gotten worse and worse. She did initially receive an injection to help for 5 days. It also gave her mouth sores. She does not want anymore injections. She's tried meloxicam that has aggravated her stomach. She is currently home Naprosyn 2 tabs twice a day and has had been best benefit from this. The pain is excruciating at times and brings her to tears. She has not tried physical therapy yet. She does do home exercises which helped minimally. Sitting seems to bring about the pain the worst. There is also pain after walking long distances.   Review of Systems  All other systems reviewed and are negative.      Objective:   Physical Exam  Constitutional: She is oriented to person, place, and time. She appears well-developed and well-nourished.  Cardiovascular: Normal rate, regular rhythm and normal heart sounds.   Pulmonary/Chest: Effort normal and breath sounds normal.  Musculoskeletal:  Normal range of motion of right hip passive and actively. With minimal discomfort. Very tender over the lateral outside right greater trochanter. No tenderness in the groin to palpation.  Strength 5/5 of right leg.   Neurological: She is alert and oriented to person, place, and time.  Psychiatric: She has a normal mood and affect. Her behavior is normal.          Assessment & Plan:  Right greater trochanteric bursitis-diagnosis is consistent with symptoms. She has had x-rays that were normal of the head. Do to continual pain we will proceed with MRI of right hip as well starting physical therapy. Physical therapy was ordered today as well as MRI. Discussed with patient that sometimes  surgery is the only option that she needs to get a good try with physical therapy. Patient has not significantly breast her right hip. I did prescribe a cane today for patient to start walking with. Encouraged patient to do no excessive walking and rest as much as possible. Ice was encouraged. Followup in 4 weeks. Norco was given for breakthrough pain. Patient was encouraged to continue 2 tabs twice daily of Naprosyn.  Spent 30 minutes with patient and greater than 50% of visit spent counseling patient regarding pain and treatment plan. She was very anxious about diagnosis and possibility of surgery.

## 2014-04-03 ENCOUNTER — Telehealth: Payer: Self-pay | Admitting: *Deleted

## 2014-04-03 DIAGNOSIS — M7061 Trochanteric bursitis, right hip: Secondary | ICD-10-CM

## 2014-04-03 NOTE — Telephone Encounter (Signed)
MRI reordered for [facility].

## 2014-04-08 ENCOUNTER — Emergency Department (INDEPENDENT_AMBULATORY_CARE_PROVIDER_SITE_OTHER)
Admission: EM | Admit: 2014-04-08 | Discharge: 2014-04-08 | Disposition: A | Discharge status: Home / Self Care | Payer: Managed Care, Other (non HMO) | Source: Home / Self Care | Attending: Family Medicine | Admitting: Family Medicine

## 2014-04-08 ENCOUNTER — Encounter: Payer: Self-pay | Admitting: Emergency Medicine

## 2014-04-08 DIAGNOSIS — M76899 Other specified enthesopathies of unspecified lower limb, excluding foot: Secondary | ICD-10-CM

## 2014-04-08 DIAGNOSIS — M7061 Trochanteric bursitis, right hip: Secondary | ICD-10-CM

## 2014-04-08 MED ORDER — PREDNISONE 20 MG PO TABS: 20.0000 mg | ORAL_TABLET | Freq: Two times a day (BID) | ORAL | Status: DC

## 2014-04-08 NOTE — ED Notes (Addendum)
Kortne has been seen and treated for on going right hip pain by [facility]. She is taking Hydrocodone for the pain and is going tomorrow to start physical therapy. The Hydrocodone does help with the pain. She is also scheduled for a MRI in a week or so. Yesterday she noticed a rash or red area on her right hip/upper right leg. She reports having some chills. Denies fevers or sweats. She does report the pain as a 6/10 on the pain scale.

## 2014-04-08 NOTE — Discharge Instructions (Signed)
Continue ice pack two or three times daily.  Continue Norco at bedtime.  May add Tylenol daytime for pain.  Continue cane.  Keep appt for MRI of hip and physical therapy.

## 2014-04-08 NOTE — ED Provider Notes (Signed)
CSN: [id number]     Arrival date & time 04/08/14  1626 History   First MD Initiated Contact with Patient 04/08/14 1658     Chief Complaint  Patient presents with  . Rash     HPI Comments: Patient complains of persistent pain and swelling over her right hip. Her pain started about 9 months ago after she had been exercising regularly for 6 months, including "lunges."  Four months ago she received a steroid injection in her right trochanteric bursa and she improved over the next five days.  However, her pain recurred.  In April and May she tried regular applications of ice and hip exercises, and was prescribed meloxicam for two weeks.  On June 21 she visited ER because of hip pain, and was instructed to apply ice packs and take naproxen.  She is presently scheduled for an MRI of her hip with contrast.  She decided to schedule PT for herself and has a PT appointment tomorrow.  She has been walking with a cane.  She has hip pain when supine on her right side.  She has a prescription for Norco which helps her sleep at night.                                                                                                                                                                                                                                                            Patient is a 48 y.o. female presenting with hip pain. The history is provided by the patient.  Hip Pain This is a chronic problem. Episode onset: 9 months ago. The problem occurs constantly. The problem has been gradually worsening. Associated symptoms comments: none. The symptoms are aggravated by walking. Nothing relieves the symptoms. Treatments tried: ice packs, exercise, and NSAID's.    Past Medical History  Diagnosis Date  . Anxiety   . Fibroid     Uterine ablation  . GERD (gastroesophageal reflux disease)   . Asthma    Past Surgical History  Procedure Laterality Date  . Cholecystectomy    . Bladder suspension    .  Tonsillectomy and adenoidectomy    . Tubal ligation    . Uternine ablasion    . Wisdom tooth extraction     Family History  Problem Relation Age  of Onset  . Cancer Mother     breast  . Heart failure Mother   . Hypertension Mother    History  Substance Use Topics  . Smoking status: Never Smoker   . Smokeless tobacco: Not on file  . Alcohol Use: No   OB History   Grav Para Term Preterm Abortions TAB SAB Ect Mult Living                 Review of Systems  All other systems reviewed and are negative.   Allergies  Amoxicillin; Augmentin; Codeine; Hctz; and Sulfa antibiotics  Home Medications   Prior to Admission medications   Medication Sig Start Date End Date Taking? Authorizing Provider  albuterol (PROVENTIL HFA;VENTOLIN HFA) 108 (90 BASE) MCG/ACT inhaler Inhale 2 puffs into the lungs every 6 (six) hours as needed for wheezing. 03/01/14  Yes Jade L Breeback, PA-C  AMBULATORY NON FORMULARY MEDICATION Medication Name: Compression stockings Diagnosis: Bilateral ankle edema  Diagnosis Code: 782.3 03/17/12  Yes Hali Marry, MD  AMBULATORY NON FORMULARY MEDICATION Walking cane.  Dx: Right greater trochanteric bursitis. 04/01/14  Yes Jade L Breeback, PA-C  clonazePAM (KLONOPIN) 1 MG tablet TAKE 1 TABLET BY MOUTH TWICE DAILY AS NEEDED   Yes Jade L Breeback, PA-C  cyclobenzaprine (FLEXERIL) 10 MG tablet One half tab PO qHS, then increase gradually to one tab TID. 05/04/13  Yes Silverio Decamp, MD  diclofenac (FLECTOR) 1.3 % PTCH Place 1 patch onto the skin 2 (two) times daily. 02/15/14  Yes Sean Hommel, DO  dicyclomine (BENTYL) 10 MG capsule Take 1 capsule (10 mg total) by mouth 4 (four) times daily -  before meals and at bedtime. 10/03/13  Yes Jade L Breeback, PA-C  esomeprazole (NEXIUM) 40 MG capsule Take 1 capsule (40 mg total) by mouth daily at 12 noon. 10/03/13  Yes Jade L Breeback, PA-C  fluticasone (FLONASE) 50 MCG/ACT nasal spray Place 2 sprays into the nose daily.  05/18/13  Yes Jade L Breeback, PA-C  gabapentin (NEURONTIN) 100 MG capsule Take 1 capsule (100 mg total) by mouth at bedtime. 01/24/13  Yes Jade L Breeback, PA-C  HYDROcodone-acetaminophen (NORCO/VICODIN) 5-325 MG per tablet Take 1-2 tablets by mouth every 6 (six) hours as needed for moderate pain or severe pain. 04/01/14  Yes Jade L Breeback, PA-C  loratadine-pseudoephedrine (CLARITIN-D 24-HOUR) 10-240 MG per 24 hr tablet Take 1 tablet by mouth daily.   Yes Historical Provider, MD  montelukast (SINGULAIR) 10 MG tablet Take 1 tablet (10 mg total) by mouth at bedtime. 03/04/14  Yes Jade L Breeback, PA-C  Multiple Vitamin (MULTIVITAMIN) tablet Take 1 tablet by mouth daily.     Yes Historical Provider, MD  sertraline (ZOLOFT) 100 MG tablet Take 1 and 1/2 tab daily. 01/24/13  Yes Jade L Breeback, PA-C  methylPREDNIsolone (MEDROL DOSPACK) 4 MG tablet follow package directions 03/04/14   Jade L Breeback, PA-C  ondansetron (ZOFRAN ODT) 8 MG disintegrating tablet Take 1 tablet (8 mg total) by mouth every 8 (eight) hours as needed for nausea or vomiting. 03/24/14   Karen Chafe Molpus, MD  ondansetron (ZOFRAN) 4 MG tablet Take 1 tablet (4 mg total) by mouth every 8 (eight) hours as needed for nausea or vomiting. 09/25/13   Jade L Breeback, PA-C  predniSONE (DELTASONE) 20 MG tablet Take 1 tablet (20 mg total) by mouth 2 (two) times daily. Take with food. 04/08/14   Kandra Nicolas, MD  promethazine (PHENERGAN) 12.5 MG tablet Take 1 tablet (  12.5 mg total) by mouth every 6 (six) hours as needed for nausea or vomiting. 09/25/13   Jade L Breeback, PA-C   BP 164/100  Pulse 111  Temp(Src) 98 F (36.7 C) (Oral)  Ht 5\' 4"  (1.626 m)  Wt 172 lb (78.019 kg)  BMI 29.51 kg/m2  SpO2 100% Physical Exam  Nursing note and vitals reviewed. Constitutional: She is oriented to person, place, and time. She appears well-developed and well-nourished. No distress.  HENT:  Head: Normocephalic.  Eyes: Conjunctivae are normal. Pupils are equal,  round, and reactive to light.  Neck: Normal range of motion.  Cardiovascular: Normal heart sounds.   Pulmonary/Chest: Breath sounds normal.  Abdominal: There is no tenderness.  Musculoskeletal:       Right hip: She exhibits tenderness and swelling. She exhibits normal range of motion, normal strength, no bony tenderness, no crepitus and no deformity.       Legs: Right hip has tenderness and mild swelling over the greater trochanter.  No erythema, warmth, or fluctuance.   FABER test is positive for lateral hip pain.  Standing on right leg for 30 seconds causes pain lateralizing to the right hip. Resisted external rotation test positive.  Neurological: She is alert and oriented to person, place, and time.  Skin: Skin is warm and dry. No rash noted.    ED Course  Procedures  none     MDM   1. Trochanteric bursitis of right hip: persistent.  Suspect tendinopathy of gluteus medius and minimus    Prednisone burst Continue ice pack two or three times daily.  Continue Norco at bedtime.  May add Tylenol daytime for pain.  Continue cane.  Keep appt for MRI of hip and physical therapy. Arrange appt with Dr. Aundria Mems for management.    Kandra Nicolas, MD 04/10/14 [phone]

## 2014-04-09 ENCOUNTER — Telehealth: Payer: Self-pay | Admitting: *Deleted

## 2014-04-09 NOTE — Telephone Encounter (Signed)
MRI approval [id number] from [facility] 04/04/14-07/03/14. Spoke with Maudie Mercury at [employer]. Margette Fast, CMA

## 2014-04-12 ENCOUNTER — Telehealth: Payer: Self-pay | Admitting: Emergency Medicine

## 2014-04-12 ENCOUNTER — Telehealth: Payer: Self-pay | Admitting: *Deleted

## 2014-04-12 NOTE — Telephone Encounter (Signed)
I spoke with Lovena Le at [employer] this morning regarding prior auth for [employer] I updated prior auth for [facility] and CPT [phone]. Auth stayed the same [id number] good thru 04/04/14-07/03/14. Imaging facility and [facility] were notified. Margette Fast, CMA

## 2014-04-15 ENCOUNTER — Inpatient Hospital Stay: Admission: RE | Admit: 2014-04-15 | Payer: Managed Care, Other (non HMO) | Source: Ambulatory Visit

## 2014-06-03 ENCOUNTER — Other Ambulatory Visit: Payer: Self-pay | Admitting: Physician Assistant

## 2014-06-08 ENCOUNTER — Emergency Department (INDEPENDENT_AMBULATORY_CARE_PROVIDER_SITE_OTHER)
Admission: EM | Admit: 2014-06-08 | Discharge: 2014-06-08 | Disposition: A | Discharge status: Home / Self Care | Payer: Managed Care, Other (non HMO) | Source: Home / Self Care | Attending: Family Medicine | Admitting: Family Medicine

## 2014-06-08 ENCOUNTER — Encounter (HOSPITAL_COMMUNITY): Payer: Self-pay | Admitting: Emergency Medicine

## 2014-06-08 DIAGNOSIS — H6692 Otitis media, unspecified, left ear: Secondary | ICD-10-CM

## 2014-06-08 DIAGNOSIS — H6690 Otitis media, unspecified, unspecified ear: Secondary | ICD-10-CM

## 2014-06-08 MED ORDER — CLINDAMYCIN HCL 300 MG PO CAPS: 300.0000 mg | ORAL_CAPSULE | Freq: Four times a day (QID) | ORAL | Status: DC

## 2014-06-08 NOTE — Discharge Instructions (Signed)

## 2014-06-08 NOTE — ED Provider Notes (Signed)
CSN: [id number]     Arrival date & time 06/08/14  0915 History   First MD Initiated Contact with Patient 06/08/14 [phone]     Chief Complaint  Patient presents with  . Otalgia   (Consider location/radiation/quality/duration/timing/severity/associated sxs/prior Treatment) HPI Comments: Patient reports chronic issues with left AOM, external otitis and eustachian tube dysfunction. followed by [facility]. Began using Ciprodex otic 2 days ago but despite this symptoms have become worse. Is s/p remote left tympanostomy but has not had drainage from left ear canal. No fever. No changes in hearing.   Patient is a 48 y.o. female presenting with ear pain. The history is provided by the patient.  Otalgia Location:  Left Quality:  Sharp and aching Severity:  Moderate Onset quality:  Gradual Duration:  4 days Timing:  Constant Progression:  Worsening Chronicity:  Recurrent Associated symptoms comment:  +left facial pain with discomfort at left upper teeth   Past Medical History  Diagnosis Date  . Anxiety   . Fibroid     Uterine ablation  . GERD (gastroesophageal reflux disease)   . Asthma    Past Surgical History  Procedure Laterality Date  . Cholecystectomy    . Bladder suspension    . Tonsillectomy and adenoidectomy    . Tubal ligation    . Uternine ablasion    . Wisdom tooth extraction     Family History  Problem Relation Age of Onset  . Cancer Mother     breast  . Heart failure Mother   . Hypertension Mother    History  Substance Use Topics  . Smoking status: Never Smoker   . Smokeless tobacco: Not on file  . Alcohol Use: No   OB History   Grav Para Term Preterm Abortions TAB SAB Ect Mult Living                 Review of Systems  HENT: Positive for ear pain.   All other systems reviewed and are negative.   Allergies  Amoxicillin; Augmentin; Codeine; Hctz; and Sulfa antibiotics  Home Medications   Prior to Admission medications   Medication Sig Start Date  End Date Taking? Authorizing Provider  Multiple Vitamin (MULTIVITAMIN) tablet Take 1 tablet by mouth daily.     Yes Historical Provider, MD  sertraline (ZOLOFT) 100 MG tablet TAKE 1 AND 1/2 TABLET BY MOUTH EVERY DAY 06/03/14  Yes Jade L Breeback, PA-C  albuterol (PROVENTIL HFA;VENTOLIN HFA) 108 (90 BASE) MCG/ACT inhaler Inhale 2 puffs into the lungs every 6 (six) hours as needed for wheezing. 03/01/14   Donella Stade, PA-C  AMBULATORY NON FORMULARY MEDICATION Medication Name: Compression stockings Diagnosis: Bilateral ankle edema  Diagnosis Code: 782.3 03/17/12   Hali Marry, MD  AMBULATORY NON FORMULARY MEDICATION Walking cane.  Dx: Right greater trochanteric bursitis. 04/01/14   Jade L Breeback, PA-C  clindamycin (CLEOCIN) 300 MG capsule Take 1 capsule (300 mg total) by mouth 4 (four) times daily. X 7 days 06/08/14   Audelia Hives Enrica Corliss, PA  clonazePAM (KLONOPIN) 1 MG tablet TAKE 1 TABLET BY MOUTH TWICE DAILY AS NEEDED    Jade L Breeback, PA-C  cyclobenzaprine (FLEXERIL) 10 MG tablet One half tab PO qHS, then increase gradually to one tab TID. 05/04/13   Silverio Decamp, MD  diclofenac (FLECTOR) 1.3 % PTCH Place 1 patch onto the skin 2 (two) times daily. 02/15/14   Marcial Pacas, DO  dicyclomine (BENTYL) 10 MG capsule Take 1 capsule (10 mg total)  by mouth 4 (four) times daily -  before meals and at bedtime. 10/03/13   Jade L Breeback, PA-C  esomeprazole (NEXIUM) 40 MG capsule Take 1 capsule (40 mg total) by mouth daily at 12 noon. 10/03/13   Jade L Breeback, PA-C  fluticasone (FLONASE) 50 MCG/ACT nasal spray Place 2 sprays into the nose daily. 05/18/13   Jade L Breeback, PA-C  gabapentin (NEURONTIN) 100 MG capsule Take 1 capsule (100 mg total) by mouth at bedtime. 01/24/13   Jade L Breeback, PA-C  HYDROcodone-acetaminophen (NORCO/VICODIN) 5-325 MG per tablet Take 1-2 tablets by mouth every 6 (six) hours as needed for moderate pain or severe pain. 04/01/14   Jade L Breeback, PA-C   loratadine-pseudoephedrine (CLARITIN-D 24-HOUR) 10-240 MG per 24 hr tablet Take 1 tablet by mouth daily.    Historical Provider, MD  methylPREDNIsolone (MEDROL DOSPACK) 4 MG tablet follow package directions 03/04/14   Jade L Breeback, PA-C  montelukast (SINGULAIR) 10 MG tablet Take 1 tablet (10 mg total) by mouth at bedtime. 03/04/14   Jade L Breeback, PA-C  ondansetron (ZOFRAN ODT) 8 MG disintegrating tablet Take 1 tablet (8 mg total) by mouth every 8 (eight) hours as needed for nausea or vomiting. 03/24/14   Karen Chafe Molpus, MD  ondansetron (ZOFRAN) 4 MG tablet Take 1 tablet (4 mg total) by mouth every 8 (eight) hours as needed for nausea or vomiting. 09/25/13   Jade L Breeback, PA-C  predniSONE (DELTASONE) 20 MG tablet Take 1 tablet (20 mg total) by mouth 2 (two) times daily. Take with food. 04/08/14   Kandra Nicolas, MD  promethazine (PHENERGAN) 12.5 MG tablet Take 1 tablet (12.5 mg total) by mouth every 6 (six) hours as needed for nausea or vomiting. 09/25/13   Jade L Breeback, PA-C   BP 146/88  Pulse 81  Temp(Src) 97.9 F (36.6 C) (Oral)  Resp 16 Physical Exam  Nursing note and vitals reviewed. Constitutional: She is oriented to person, place, and time. She appears well-developed and well-nourished. No distress.  HENT:  Head: Normocephalic and atraumatic.  Right Ear: Hearing, tympanic membrane, external ear and ear canal normal.  Left Ear: Hearing, external ear and ear canal normal. No drainage, swelling or tenderness. No mastoid tenderness. Tympanic membrane is injected, scarred, perforated and erythematous.  No middle ear effusion. No decreased hearing is noted.  Nose: Nose normal.  Mouth/Throat: Uvula is midline, oropharynx is clear and moist and mucous membranes are normal. No oral lesions. No trismus in the jaw. Normal dentition. No dental abscesses, uvula swelling or dental caries.  Eyes: Conjunctivae are normal. No scleral icterus.  Neck: Normal range of motion. Neck supple.  Single  tender slightly enlarged left preauricular lymph node  Cardiovascular: Normal rate, regular rhythm and normal heart sounds.   Pulmonary/Chest: Effort normal and breath sounds normal.  Musculoskeletal: Normal range of motion.  Neurological: She is alert and oriented to person, place, and time.  Skin: Skin is warm and dry. No rash noted. No erythema.  Psychiatric: She has a normal mood and affect. Her behavior is normal.    ED Course  Procedures (including critical care time) Labs Review Labs Reviewed - No data to display  Imaging Review No results found.   MDM   1. Recurrent acute otitis media of left ear, unspecified otitis media type    Given patient's multiple drug allergies, will treat AOM (left) with oral clindamycin and advise ibuprofen for pain and follow up with [facility] if no improvement.  [place], [place] 06/08/14 [phone]

## 2014-06-08 NOTE — ED Notes (Signed)
C/o left ear pain. Severe for the past 12 to 24 hours.  Pressure on left side of face and pain in teeth.  Denies drainage.  Denies fever.

## 2014-06-08 NOTE — ED Provider Notes (Signed)
Medical screening examination/treatment/procedure(s) were performed by resident physician or non-physician practitioner and as supervising physician I was immediately available for consultation/collaboration.   Pauline Good MD.   Billy Fischer, MD 06/08/14 [phone]

## 2014-06-26 ENCOUNTER — Ambulatory Visit: Payer: Managed Care, Other (non HMO) | Admitting: Physician Assistant

## 2014-07-01 ENCOUNTER — Encounter: Payer: Self-pay | Admitting: Physician Assistant

## 2014-07-01 ENCOUNTER — Ambulatory Visit (INDEPENDENT_AMBULATORY_CARE_PROVIDER_SITE_OTHER): Payer: Managed Care, Other (non HMO) | Admitting: Physician Assistant

## 2014-07-01 ENCOUNTER — Ambulatory Visit: Payer: Managed Care, Other (non HMO) | Admitting: Physician Assistant

## 2014-07-01 VITALS — BP 140/80 | HR 93 | Ht 64.0 in | Wt 179.0 lb

## 2014-07-01 DIAGNOSIS — Z23 Encounter for immunization: Secondary | ICD-10-CM

## 2014-07-01 DIAGNOSIS — M76899 Other specified enthesopathies of unspecified lower limb, excluding foot: Secondary | ICD-10-CM

## 2014-07-01 DIAGNOSIS — M7061 Trochanteric bursitis, right hip: Secondary | ICD-10-CM

## 2014-07-01 MED ORDER — HYDROCODONE-ACETAMINOPHEN 5-325 MG PO TABS: 1.0000 | ORAL_TABLET | Freq: Four times a day (QID) | ORAL | Status: DC | PRN

## 2014-07-01 NOTE — Progress Notes (Signed)
   Subjective:    Patient ID: Shirley Vasquez, female    DOB: 1966-02-26, 48 y.o.   MRN: [id number]  HPI Pt presents to the clinic to follow up on right greater trochanteric bursitis. She has been in PT for 11 weeks and reports to be 70 percent improved. She walked on treadmill 8 minutes last night with litthe pain. She still has some right bursa pain with palpation, turns, and walking for long distances. She continues to take mobic which does help but not every day. She also likes to have hydrocodone on file when pain acutely worsens.     Review of Systems  All other systems reviewed and are negative.      Objective:   Physical Exam  Constitutional: She is oriented to person, place, and time. She appears well-developed and well-nourished.  HENT:  Head: Normocephalic and atraumatic.  Musculoskeletal:  Right hip: Still pain with palpation over bursa.  ROM normal.  Strength of right leg and hip normal.    Neurological: She is alert and oriented to person, place, and time.          Assessment & Plan:  Greater trochanteric bursitis, right- will reorder MRI to get approved. Continue mobic daily. Hydrocodone as needed for acute pain. Continue PT exercises. Ice after exercise and walking.   Flu shot given today without complications.

## 2014-07-01 NOTE — Patient Instructions (Signed)
Will place MRI.

## 2014-07-02 ENCOUNTER — Other Ambulatory Visit: Payer: Managed Care, Other (non HMO)

## 2014-07-12 ENCOUNTER — Telehealth: Payer: Self-pay | Admitting: *Deleted

## 2014-07-12 DIAGNOSIS — F411 Generalized anxiety disorder: Secondary | ICD-10-CM

## 2014-07-12 MED ORDER — DIAZEPAM 10 MG PO TABS: ORAL_TABLET | ORAL | Status: DC

## 2014-07-12 NOTE — Telephone Encounter (Signed)
Pt notified of rx. Sent to local [facility].

## 2014-07-12 NOTE — Telephone Encounter (Signed)
Shirley Vasquez is scheduled to have a MRI of her right hip on Monday evening she was asking about a valium that she can take prior to exam. She is clausterphobic and understands that she needs a driver. She uses the mail pharmacy on file. Thank you. Margette Fast, CMA

## 2014-07-12 NOTE — Telephone Encounter (Signed)
Amber, I'd recommend she use a local pharmacy so that she'll have it by Monday, Rx in your inbox.

## 2014-07-15 ENCOUNTER — Ambulatory Visit
Admission: RE | Admit: 2014-07-15 | Discharge: 2014-07-15 | Disposition: A | Discharge status: Home / Self Care | Payer: Managed Care, Other (non HMO) | Source: Ambulatory Visit | Attending: Physician Assistant | Admitting: Physician Assistant

## 2014-07-15 MED ORDER — GADOBENATE DIMEGLUMINE 529 MG/ML IV SOLN
15.0000 mL | Freq: Once | INTRAVENOUS | Status: AC | PRN
  Administered 2014-07-15: 15 mL via INTRAVENOUS

## 2014-07-24 ENCOUNTER — Encounter: Payer: Self-pay | Admitting: Physician Assistant

## 2014-07-24 ENCOUNTER — Ambulatory Visit (INDEPENDENT_AMBULATORY_CARE_PROVIDER_SITE_OTHER): Payer: Managed Care, Other (non HMO) | Admitting: Physician Assistant

## 2014-07-24 VITALS — BP 133/76 | HR 86 | Temp 97.5°F | Resp 16 | Wt 179.0 lb

## 2014-07-24 DIAGNOSIS — F410 Panic disorder [episodic paroxysmal anxiety] without agoraphobia: Secondary | ICD-10-CM

## 2014-07-24 DIAGNOSIS — F419 Anxiety disorder, unspecified: Secondary | ICD-10-CM

## 2014-07-24 DIAGNOSIS — M7061 Trochanteric bursitis, right hip: Secondary | ICD-10-CM

## 2014-07-24 DIAGNOSIS — G44209 Tension-type headache, unspecified, not intractable: Secondary | ICD-10-CM

## 2014-07-24 DIAGNOSIS — M5416 Radiculopathy, lumbar region: Secondary | ICD-10-CM

## 2014-07-24 DIAGNOSIS — M5417 Radiculopathy, lumbosacral region: Secondary | ICD-10-CM

## 2014-07-24 MED ORDER — BUSPIRONE HCL 7.5 MG PO TABS: 7.5000 mg | ORAL_TABLET | Freq: Three times a day (TID) | ORAL | Status: DC

## 2014-07-24 MED ORDER — CLONAZEPAM 1 MG PO TABS: 1.0000 mg | ORAL_TABLET | Freq: Two times a day (BID) | ORAL | Status: DC | PRN

## 2014-07-24 MED ORDER — KETOROLAC TROMETHAMINE 60 MG/2ML IM SOLN
60.0000 mg | Freq: Once | INTRAMUSCULAR | Status: AC
  Administered 2014-07-24: 60 mg via INTRAMUSCULAR

## 2014-07-24 NOTE — Progress Notes (Signed)
   Subjective:    Patient ID: Shirley Vasquez, female    DOB: 02-07-1966, 48 y.o.   MRN: [id number]  HPI Pt presents to the clinic to go over MRI results.  She continues to have more right sided hip pain than left. Recently she has started traction therapy and feels like it is really helping. She is sitting at 11 weeks of PT. It does help but she is still having pain that improves and then worsens. She has tried and failed 2 non-ultrasound guided injections. She does admit today that she also has off and on low back pain as well. Occasionally pain will radiate down right outerleg down to her knee. Not all the time and PT helping. Taking mobic as needed and norco for really bad days.   She has recently felt like her anxiety has worsened. She wakes up in panic in the middle of night some nights. She is almost shaking. If she takes klonapin does help.       Review of Systems  All other systems reviewed and are negative.      Objective:   Physical Exam        Assessment & Plan:  Greater trochanteric bursitis of right and left hip/low back pain- discussed with pt MRI shows worse on left than right but does not coordinate with pain. Certainly some of pain could be caused by this but perhaps pain is coming from back as well. Offered ortho referral. Pt will think on it. Offered xrays of lower back to look for any causes of radiculopathy. Pt declined today. She has PT appt and since new therapy is helping would like to continue for a week or so.    Panic attacks/anxiety- added buspar bid to zoloft. Follow up in 4-6 weeks.

## 2014-07-26 ENCOUNTER — Telehealth: Payer: Self-pay | Admitting: *Deleted

## 2014-07-26 DIAGNOSIS — M549 Dorsalgia, unspecified: Secondary | ICD-10-CM

## 2014-07-26 NOTE — Telephone Encounter (Signed)
Pt called wanting [place] to know that she's decided to see an ortho.  She has an appt on 11-3 with Dr. Len Childs at [facility].  She is also wanting to know if you could order the x-ray that you guys discussed as well as the referral.  I advised her to go ahead & get her MRI images burned to a disc so she can take them with her to her appt.

## 2014-07-30 ENCOUNTER — Ambulatory Visit
Admission: RE | Admit: 2014-07-30 | Discharge: 2014-07-30 | Disposition: A | Discharge status: Home / Self Care | Payer: Managed Care, Other (non HMO) | Source: Ambulatory Visit | Attending: Physician Assistant | Admitting: Physician Assistant

## 2014-07-30 ENCOUNTER — Other Ambulatory Visit: Payer: Self-pay | Admitting: Physician Assistant

## 2014-07-30 DIAGNOSIS — M5441 Lumbago with sciatica, right side: Secondary | ICD-10-CM

## 2014-07-30 NOTE — Telephone Encounter (Signed)
I reordered it and sent it [facility].  They are in the preferred network with her ins.  Pt notified.

## 2014-07-30 NOTE — Telephone Encounter (Signed)
Lumbar xray order sent.   Tell Shirley Vasquez that I Love the scents she gave me. That was so thoughtful and made my day.

## 2014-07-30 NOTE — Telephone Encounter (Signed)
Thanks

## 2014-07-31 ENCOUNTER — Encounter: Payer: Self-pay | Admitting: Physician Assistant

## 2014-07-31 DIAGNOSIS — M545 Low back pain, unspecified: Secondary | ICD-10-CM | POA: Insufficient documentation

## 2014-08-07 DIAGNOSIS — M7060 Trochanteric bursitis, unspecified hip: Secondary | ICD-10-CM | POA: Insufficient documentation

## 2014-08-20 ENCOUNTER — Encounter: Payer: Managed Care, Other (non HMO) | Admitting: Family Medicine

## 2014-08-20 ENCOUNTER — Ambulatory Visit (INDEPENDENT_AMBULATORY_CARE_PROVIDER_SITE_OTHER): Payer: Managed Care, Other (non HMO) | Admitting: Physician Assistant

## 2014-08-20 ENCOUNTER — Encounter: Payer: Self-pay | Admitting: Physician Assistant

## 2014-08-20 VITALS — BP 113/68 | HR 98 | Temp 97.8°F | Wt 178.0 lb

## 2014-08-20 DIAGNOSIS — H9202 Otalgia, left ear: Secondary | ICD-10-CM

## 2014-08-20 MED ORDER — HYDROCORTISONE-ACETIC ACID 1-2 % OT SOLN: 3.0000 [drp] | Freq: Four times a day (QID) | OTIC | Status: DC

## 2014-08-20 MED ORDER — FLUTICASONE PROPIONATE 50 MCG/ACT NA SUSP: 2.0000 | Freq: Every day | NASAL | Status: DC

## 2014-08-20 NOTE — Progress Notes (Signed)
   Subjective:    Patient ID: Shirley Vasquez, female    DOB: [birth year], 48 y.o.   MRN: [id number]  HPI Pt presents to the clinic left ear started Friday. She used left over ciprodex with little relief but not much. She now has a lot of pressure in her left ear. Hx of ear pain and seen by ENT. She denies any travel to increased elevation. No fever, chills, sinus pressure, cough, ST.    Review of Systems  All other systems reviewed and are negative.      Objective:   Physical Exam  Constitutional: She is oriented to person, place, and time. She appears well-developed and well-nourished.  HENT:  Head: Normocephalic and atraumatic.  Nose: Nose normal.  Mouth/Throat: Oropharynx is clear and moist.  Right TM- slighty retracted with minimal erythema.  Left TM- small pin point hole in middle of TM with some erythema but no blood or pus.   Eyes: Conjunctivae are normal. Right eye exhibits no discharge. Left eye exhibits no discharge.  Neck: Normal range of motion. Neck supple.  Cardiovascular: Normal rate, regular rhythm and normal heart sounds.   Pulmonary/Chest: Effort normal and breath sounds normal. She has no wheezes.  Lymphadenopathy:    She has no cervical adenopathy.  Neurological: She is alert and oriented to person, place, and time.  Skin: Skin is dry.  Psychiatric: She has a normal mood and affect. Her behavior is normal.          Assessment & Plan:  Left ear pain- unclear etiology. Reassured patient does not appear infected. Could be eustachian tube dysfunction and some inflammation. Not using flonase. Refilled for use. Gave steroid ear drops for inflammation up to 4 times a day. If not improving by Friday could consider oral steroids.

## 2014-08-22 NOTE — Progress Notes (Signed)
   Subjective:    Patient ID: Shirley Vasquez, female    DOB: 1965-12-27, 48 y.o.   MRN: [id number]  HPI  Error   Review of Systems     Objective:   Physical Exam        Assessment & Plan:

## 2014-08-23 ENCOUNTER — Other Ambulatory Visit: Payer: Self-pay | Admitting: Physician Assistant

## 2014-08-26 ENCOUNTER — Telehealth: Payer: Self-pay | Admitting: Physician Assistant

## 2014-08-26 ENCOUNTER — Other Ambulatory Visit: Payer: Self-pay | Admitting: *Deleted

## 2014-08-26 MED ORDER — AZITHROMYCIN 250 MG PO TABS: ORAL_TABLET | ORAL | Status: DC

## 2014-08-26 NOTE — Telephone Encounter (Signed)
Ms. Dignan called. She wants Dr to call in meds for respiratory infection. She is presently in [place].  Please call script into [facility], ph [phone].

## 2014-08-26 NOTE — Telephone Encounter (Signed)
Ok to send zpak 2 tablets now and then one tablet for 4 days to pharmacy.

## 2014-08-26 NOTE — Telephone Encounter (Signed)
LMOM notifying pt of rx. 

## 2014-08-31 ENCOUNTER — Emergency Department (INDEPENDENT_AMBULATORY_CARE_PROVIDER_SITE_OTHER)
Admission: EM | Admit: 2014-08-31 | Discharge: 2014-08-31 | Disposition: A | Discharge status: Home / Self Care | Payer: Managed Care, Other (non HMO) | Source: Home / Self Care | Attending: Emergency Medicine | Admitting: Emergency Medicine

## 2014-08-31 ENCOUNTER — Encounter: Payer: Self-pay | Admitting: Emergency Medicine

## 2014-08-31 DIAGNOSIS — J208 Acute bronchitis due to other specified organisms: Secondary | ICD-10-CM

## 2014-08-31 DIAGNOSIS — J0120 Acute ethmoidal sinusitis, unspecified: Secondary | ICD-10-CM

## 2014-08-31 MED ORDER — ALBUTEROL SULFATE HFA 108 (90 BASE) MCG/ACT IN AERS: 1.0000 | INHALATION_SPRAY | Freq: Four times a day (QID) | RESPIRATORY_TRACT | Status: DC | PRN

## 2014-08-31 MED ORDER — METHYLPREDNISOLONE SODIUM SUCC 125 MG IJ SOLR
125.0000 mg | Freq: Once | INTRAMUSCULAR | Status: AC
  Administered 2014-08-31: 125 mg via INTRAMUSCULAR

## 2014-08-31 MED ORDER — LEVOFLOXACIN 500 MG PO TABS: 500.0000 mg | ORAL_TABLET | Freq: Every day | ORAL | Status: DC

## 2014-08-31 NOTE — Discharge Instructions (Signed)
Acute Bronchitis Bronchitis is inflammation of the airways that extend from the windpipe into the lungs (bronchi). The inflammation often causes mucus to develop. This leads to a cough, which is the most common symptom of bronchitis.  In acute bronchitis, the condition usually develops suddenly and goes away over time, usually in a couple weeks. Smoking, allergies, and asthma can make bronchitis worse. Repeated episodes of bronchitis may cause further lung problems.  CAUSES Acute bronchitis is most often caused by the same virus that causes a cold. The virus can spread from person to person (contagious) through coughing, sneezing, and touching contaminated objects. SIGNS AND SYMPTOMS   Cough.   Fever.   Coughing up mucus.   Body aches.   Chest congestion.   Chills.   Shortness of breath.   Sore throat.  DIAGNOSIS  Acute bronchitis is usually diagnosed through a physical exam. Your health care provider will also ask you questions about your medical history. Tests, such as chest X-rays, are sometimes done to rule out other conditions.  TREATMENT  Acute bronchitis usually goes away in a couple weeks. Oftentimes, no medical treatment is necessary. Medicines are sometimes given for relief of fever or cough. Antibiotic medicines are usually not needed but may be prescribed in certain situations. In some cases, an inhaler may be recommended to help reduce shortness of breath and control the cough. A cool mist vaporizer may also be used to help thin bronchial secretions and make it easier to clear the chest.  HOME CARE INSTRUCTIONS  Get plenty of rest.   Drink enough fluids to keep your urine clear or pale yellow (unless you have a medical condition that requires fluid restriction). Increasing fluids may help thin your respiratory secretions (sputum) and reduce chest congestion, and it will prevent dehydration.   Take medicines only as directed by your health care provider.  If  you were prescribed an antibiotic medicine, finish it all even if you start to feel better.  Avoid smoking and secondhand smoke. Exposure to cigarette smoke or irritating chemicals will make bronchitis worse. If you are a smoker, consider using nicotine gum or skin patches to help control withdrawal symptoms. Quitting smoking will help your lungs heal faster.   Reduce the chances of another bout of acute bronchitis by washing your hands frequently, avoiding people with cold symptoms, and trying not to touch your hands to your mouth, nose, or eyes.   Keep all follow-up visits as directed by your health care provider.  SEEK MEDICAL CARE IF: Your symptoms do not improve after 1 week of treatment.  SEEK IMMEDIATE MEDICAL CARE IF:  You develop an increased fever or chills.   You have chest pain.   You have severe shortness of breath.  You have bloody sputum.   You develop dehydration.  You faint or repeatedly feel like you are going to pass out.  You develop repeated vomiting.  You develop a severe headache. MAKE SURE YOU:   Understand these instructions.  Will watch your condition.  Will get help right away if you are not doing well or get worse. Document Released: 10/28/2004 Document Revised: 02/04/2014 Document Reviewed: 03/13/2013 ExitCare Patient Information 2015 [employer]. This information is not intended to replace advice given to you by your health care provider. Make sure you discuss any questions you have with your health care provider. Sinusitis Sinusitis is redness, soreness, and inflammation of the paranasal sinuses. Paranasal sinuses are air pockets within the bones of your face (beneath the   eyes, the middle of the forehead, or above the eyes). In healthy paranasal sinuses, mucus is able to drain out, and air is able to circulate through them by way of your nose. However, when your paranasal sinuses are inflamed, mucus and air can become trapped. This can  allow bacteria and other germs to grow and cause infection. Sinusitis can develop quickly and last only a short time (acute) or continue over a long period (chronic). Sinusitis that lasts for more than 12 weeks is considered chronic.  CAUSES  Causes of sinusitis include:  Allergies.  Structural abnormalities, such as displacement of the cartilage that separates your nostrils (deviated septum), which can decrease the air flow through your nose and sinuses and affect sinus drainage.  Functional abnormalities, such as when the small hairs (cilia) that line your sinuses and help remove mucus do not work properly or are not present. SIGNS AND SYMPTOMS  Symptoms of acute and chronic sinusitis are the same. The primary symptoms are pain and pressure around the affected sinuses. Other symptoms include:  Upper toothache.  Earache.  Headache.  Bad breath.  Decreased sense of smell and taste.  A cough, which worsens when you are lying flat.  Fatigue.  Fever.  Thick drainage from your nose, which often is green and may contain pus (purulent).  Swelling and warmth over the affected sinuses. DIAGNOSIS  Your health care provider will perform a physical exam. During the exam, your health care provider may:  Look in your nose for signs of abnormal growths in your nostrils (nasal polyps).  Tap over the affected sinus to check for signs of infection.  View the inside of your sinuses (endoscopy) using an imaging device that has a light attached (endoscope). If your health care provider suspects that you have chronic sinusitis, one or more of the following tests may be recommended:  Allergy tests.  Nasal culture. A sample of mucus is taken from your nose, sent to a lab, and screened for bacteria.  Nasal cytology. A sample of mucus is taken from your nose and examined by your health care provider to determine if your sinusitis is related to an allergy. TREATMENT  Most cases of acute  sinusitis are related to a viral infection and will resolve on their own within 10 days. Sometimes medicines are prescribed to help relieve symptoms (pain medicine, decongestants, nasal steroid sprays, or saline sprays).  However, for sinusitis related to a bacterial infection, your health care provider will prescribe antibiotic medicines. These are medicines that will help kill the bacteria causing the infection.  Rarely, sinusitis is caused by a fungal infection. In theses cases, your health care provider will prescribe antifungal medicine. For some cases of chronic sinusitis, surgery is needed. Generally, these are cases in which sinusitis recurs more than 3 times per year, despite other treatments. HOME CARE INSTRUCTIONS   Drink plenty of water. Water helps thin the mucus so your sinuses can drain more easily.  Use a humidifier.  Inhale steam 3 to 4 times a day (for example, sit in the bathroom with the shower running).  Apply a warm, moist washcloth to your face 3 to 4 times a day, or as directed by your health care provider.  Use saline nasal sprays to help moisten and clean your sinuses.  Take medicines only as directed by your health care provider.  If you were prescribed either an antibiotic or antifungal medicine, finish it all even if you start to feel better. SEEK IMMEDIATE MEDICAL   CARE IF:  You have increasing pain or severe headaches.  You have nausea, vomiting, or drowsiness.  You have swelling around your face.  You have vision problems.  You have a stiff neck.  You have difficulty breathing. MAKE SURE YOU:   Understand these instructions.  Will watch your condition.  Will get help right away if you are not doing well or get worse. Document Released: 09/20/2005 Document Revised: 02/04/2014 Document Reviewed: 10/05/2011 ExitCare Patient Information 2015 [employer]. This information is not intended to replace advice given to you by your health care provider.  Make sure you discuss any questions you have with your health care provider.  

## 2014-08-31 NOTE — ED Notes (Signed)
Reports congestion, intermittent fever, cough, sneezing, hoarseness for over a week; finished z-pack yesterday from PCP/Jade Breeback. Took Musinex D at 0400.

## 2014-08-31 NOTE — ED Provider Notes (Signed)
CSN: [id number]     Arrival date & time 08/31/14  4627 History   First MD Initiated Contact with Patient 08/31/14 [phone]     Chief Complaint  Patient presents with  . Nasal Congestion  . Cough  . Hoarse  . Fever   (Consider location/radiation/quality/duration/timing/severity/associated sxs/prior Treatment) Patient is a 48 y.o. female presenting with cough and fever. The history is provided by the patient. No language interpreter was used.  Cough Cough characteristics:  Productive Sputum characteristics:  Green Severity:  Moderate Onset quality:  Gradual Duration:  1 week Timing:  Constant Progression:  Worsening Chronicity:  New Smoker: no   Context: upper respiratory infection   Relieved by:  Nothing Worsened by:  Nothing tried Ineffective treatments:  None tried Associated symptoms: fever   Fever Associated symptoms: cough     Past Medical History  Diagnosis Date  . Anxiety   . Fibroid     Uterine ablation  . GERD (gastroesophageal reflux disease)   . Asthma    Past Surgical History  Procedure Laterality Date  . Cholecystectomy    . Bladder suspension    . Tonsillectomy and adenoidectomy    . Tubal ligation    . Uternine ablasion    . Wisdom tooth extraction     Family History  Problem Relation Age of Onset  . Cancer Mother     breast  . Heart failure Mother   . Hypertension Mother    History  Substance Use Topics  . Smoking status: Never Smoker   . Smokeless tobacco: Not on file  . Alcohol Use: No   OB History    No data available     Review of Systems  Constitutional: Positive for fever.  Respiratory: Positive for cough.   All other systems reviewed and are negative.   Allergies  Amoxicillin; Augmentin; Codeine; Hctz; and Sulfa antibiotics  Home Medications   Prior to Admission medications   Medication Sig Start Date End Date Taking? Authorizing Provider  acetic acid-hydrocortisone (VOSOL-HC) otic solution Place 3 drops into both ears 4  (four) times daily. 08/20/14   Jade L Breeback, PA-C  albuterol (PROVENTIL HFA;VENTOLIN HFA) 108 (90 BASE) MCG/ACT inhaler Inhale 2 puffs into the lungs every 6 (six) hours as needed for wheezing. 03/01/14   Donella Stade, PA-C  albuterol (PROVENTIL HFA;VENTOLIN HFA) 108 (90 BASE) MCG/ACT inhaler Inhale 1-2 puffs into the lungs every 6 (six) hours as needed for wheezing or shortness of breath. 08/31/14   Fransico Meadow, PA-C  AMBULATORY NON FORMULARY MEDICATION Medication Name: Compression stockings Diagnosis: Bilateral ankle edema  Diagnosis Code: 782.3 03/17/12   Hali Marry, MD  AMBULATORY NON FORMULARY MEDICATION Walking cane.  Dx: Right greater trochanteric bursitis. 04/01/14   Jade L Breeback, PA-C  azithromycin (ZITHROMAX Z-PAK) 250 MG tablet Take 2 tabs today, 1 tab daily starting tomorrow 08/26/14   Donella Stade, PA-C  busPIRone (BUSPAR) 7.5 MG tablet Take 1 tablet (7.5 mg total) by mouth 3 (three) times daily. 07/24/14   Jade L Breeback, PA-C  clonazePAM (KLONOPIN) 1 MG tablet Take 1 tablet (1 mg total) by mouth 2 (two) times daily as needed for anxiety. 07/24/14   Jade L Breeback, PA-C  esomeprazole (NEXIUM) 40 MG capsule TAKE ONE CAPSULE BY MOUTH EVERY DAY AT NOON 08/23/14   Jade L Breeback, PA-C  fluticasone (FLONASE) 50 MCG/ACT nasal spray Place 2 sprays into both nostrils daily. 08/20/14   Donella Stade, PA-C  HYDROcodone-acetaminophen (NORCO/VICODIN) 5-325  MG per tablet Take 1-2 tablets by mouth every 6 (six) hours as needed for moderate pain or severe pain. 07/01/14   Jade L Breeback, PA-C  levofloxacin (LEVAQUIN) 500 MG tablet Take 1 tablet (500 mg total) by mouth daily. 08/31/14   Fransico Meadow, PA-C  loratadine-pseudoephedrine (CLARITIN-D 24-HOUR) 10-240 MG per 24 hr tablet Take 1 tablet by mouth daily.    Historical Provider, MD  Multiple Vitamin (MULTIVITAMIN) tablet Take 1 tablet by mouth daily.      Historical Provider, MD  sertraline (ZOLOFT) 100 MG tablet TAKE 1  AND 1/2 TABLET BY MOUTH EVERY DAY 06/03/14   Jade L Breeback, PA-C   BP 113/76 mmHg  Pulse 93  Temp(Src) 98.1 F (36.7 C) (Oral)  Resp 16  Ht 5\' 3"  (1.6 m)  Wt 178 lb (80.74 kg)  BMI 31.54 kg/m2  SpO2 97% Physical Exam  Constitutional: She appears well-developed and well-nourished.  HENT:  Head: Normocephalic.  Right Ear: External ear normal.  Left Ear: External ear normal.  Nose: Nose normal.  Mouth/Throat: Oropharynx is clear and moist.  Sinus congestion  Tender maxillary sinuses  Eyes: Pupils are equal, round, and reactive to light.  Neck: Normal range of motion. Neck supple.  Cardiovascular: Normal rate and normal heart sounds.   Pulmonary/Chest: Effort normal.  Abdominal: Soft.  Neurological: She is alert.  Skin: Skin is warm.  Psychiatric: She has a normal mood and affect.  Nursing note and vitals reviewed.   ED Course  Procedures (including critical care time) Labs Review Labs Reviewed - No data to display  Imaging Review No results found.   MDM   1. Acute bronchitis due to other specified organisms   2. Subacute ethmoidal sinusitis    avs Follow up with Jade next week if symptoms persist Levaquin Albuterol solumedrol injection     Fransico Meadow, PA-C 08/31/14 [phone]

## 2014-09-02 ENCOUNTER — Telehealth: Payer: Self-pay | Admitting: Emergency Medicine

## 2014-09-03 ENCOUNTER — Encounter: Payer: Self-pay | Admitting: Physician Assistant

## 2014-09-03 ENCOUNTER — Ambulatory Visit (INDEPENDENT_AMBULATORY_CARE_PROVIDER_SITE_OTHER): Payer: Managed Care, Other (non HMO) | Admitting: Physician Assistant

## 2014-09-03 VITALS — BP 135/87 | HR 93 | Ht 63.0 in | Wt 176.0 lb

## 2014-09-03 DIAGNOSIS — H6982 Other specified disorders of Eustachian tube, left ear: Secondary | ICD-10-CM

## 2014-09-03 DIAGNOSIS — H6992 Unspecified Eustachian tube disorder, left ear: Secondary | ICD-10-CM

## 2014-09-03 DIAGNOSIS — H9202 Otalgia, left ear: Secondary | ICD-10-CM

## 2014-09-03 MED ORDER — PREDNISONE 20 MG PO TABS: ORAL_TABLET | ORAL | Status: DC

## 2014-09-03 NOTE — Progress Notes (Signed)
   Subjective:    Patient ID: Shirley Vasquez, female    DOB: 1966-08-23, 48 y.o.   MRN: [id number]  HPI Pt presents to the clinic with continual left ear pain and pressure and off and on sore throat. She is on levaquin from 08/31/14 UC visit with dx of bronchitis. She continues on levaquin. She does feel much better. No cough, wheezing or SOB. She remains with left ear pain and pressure and into left jaw. No fever or chills.   Review of Systems  All other systems reviewed and are negative.      Objective:   Physical Exam  Constitutional: She is oriented to person, place, and time. She appears well-developed and well-nourished.  HENT:  Head: Normocephalic and atraumatic.  Right Ear: External ear normal.  Nose: Nose normal.  Mouth/Throat: Oropharynx is clear and moist. No oropharyngeal exudate.  Left TM with central perforation(ongoing), injected no blood or pus.   Left sided only maxillary tenderness to palpation.   Eyes: Conjunctivae are normal. Right eye exhibits no discharge. Left eye exhibits no discharge.  Neck: Normal range of motion. Neck supple.  Left sided periocular lymph node enlargement. Not tender to palpation.   Cardiovascular: Normal rate, regular rhythm and normal heart sounds.   Pulmonary/Chest: Effort normal and breath sounds normal. She has no wheezes.  Neurological: She is alert and oriented to person, place, and time.  Skin: Skin is dry.  Psychiatric: She has a normal mood and affect. Her behavior is normal.          Assessment & Plan:  eustation tube dysfunction- reassured patient no infection seen. Left TM has ongoing perforation and redness but nothing else. Slightly concerned with pressure over left maxillary sinuses. We may need to proceed with CT of sinuses to confirm does not need medication or chronic sinusitis. Pt is on levaquin today. Will wait and see if sinus pressure starts back up and decided. Today Prednisone taper given. Continue flonase.  Discussed humidifer in room. She has an ENT and may need to follow up if not improving.

## 2014-09-04 ENCOUNTER — Ambulatory Visit: Payer: Managed Care, Other (non HMO) | Admitting: Physician Assistant

## 2014-09-13 ENCOUNTER — Encounter: Payer: Managed Care, Other (non HMO) | Admitting: Physician Assistant

## 2014-09-17 ENCOUNTER — Ambulatory Visit (INDEPENDENT_AMBULATORY_CARE_PROVIDER_SITE_OTHER): Payer: Managed Care, Other (non HMO) | Admitting: Physician Assistant

## 2014-09-17 ENCOUNTER — Encounter: Payer: Self-pay | Admitting: Physician Assistant

## 2014-09-17 VITALS — BP 121/69 | HR 82 | Ht 63.0 in | Wt 179.0 lb

## 2014-09-17 DIAGNOSIS — Z13220 Encounter for screening for lipoid disorders: Secondary | ICD-10-CM

## 2014-09-17 DIAGNOSIS — Z0000 Encounter for general adult medical examination without abnormal findings: Secondary | ICD-10-CM

## 2014-09-17 DIAGNOSIS — Z79899 Other long term (current) drug therapy: Secondary | ICD-10-CM

## 2014-09-17 DIAGNOSIS — R0981 Nasal congestion: Secondary | ICD-10-CM

## 2014-09-17 DIAGNOSIS — H938X2 Other specified disorders of left ear: Secondary | ICD-10-CM

## 2014-09-17 DIAGNOSIS — Z131 Encounter for screening for diabetes mellitus: Secondary | ICD-10-CM

## 2014-09-17 MED ORDER — ESOMEPRAZOLE MAGNESIUM 40 MG PO CPDR: DELAYED_RELEASE_CAPSULE | ORAL | Status: DC

## 2014-09-17 NOTE — Patient Instructions (Addendum)
Will get allergy testing.   Keeping You Healthy  Get These Tests 1. Blood Pressure- Have your blood pressure checked once a year by your health care provider.  Normal blood pressure is 120/80. 2. Weight- Have your body mass index (BMI) calculated to screen for obesity.  BMI is measure of body fat based on height and weight.  You can also calculate your own BMI at GravelBags.it. 3. Cholesterol- Have your cholesterol checked every 5 years starting at age 49 then yearly starting at age 46. 82. Chlamydia, HIV, and other sexually transmitted diseases- Get screened every year until age 26, then within three months of each new sexual provider. 5. Pap Smear- Every 1-3 years; discuss with your health care provider. 6. Mammogram- Every year starting at age 62  Take these medicines  Calcium with Vitamin D-Your body needs 1200 mg of Calcium each day and [phone] IU of Vitamin D daily.  Your body can only absorb 500 mg of Calcium at a time so Calcium must be taken in 2 or 3 divided doses throughout the day.  Multivitamin with folic acid- Once daily if it is possible for you to become pregnant.  Get these Immunizations  Gardasil-Series of three doses; prevents HPV related illness such as genital warts and cervical cancer.  Menactra-Single dose; prevents meningitis.  Tetanus shot- Every 10 years.  Flu shot-Every year.  Take these steps 1. Do not smoke-Your healthcare provider can help you quit.  For tips on how to quit go to www.smokefree.gov or call 1-800 QUITNOW. 2. Be physically active- Exercise 5 days a week for at least 30 minutes.  If you are not already physically active, start slow and gradually work up to 30 minutes of moderate physical activity.  Examples of moderate activity include walking briskly, dancing, swimming, bicycling, etc. 3. Breast Cancer- A self breast exam every month is important for early detection of breast cancer.  For more information and instruction on self  breast exams, ask your healthcare provider or https://www.patel.info/. 4. Eat a healthy diet- Eat a variety of healthy foods such as fruits, vegetables, whole grains, low fat milk, low fat cheeses, yogurt, lean meats, poultry and fish, beans, nuts, tofu, etc.  For more information go to www. Thenutritionsource.org 5. Drink alcohol in moderation- Limit alcohol intake to one drink or less per day. Never drink and drive. 6. Depression- Your emotional health is as important as your physical health.  If you're feeling down or losing interest in things you normally enjoy please talk to your healthcare provider about being screened for depression. 7. Dental visit- Brush and floss your teeth twice daily; visit your dentist twice a year. 8. Eye doctor- Get an eye exam at least every 2 years. 9. Helmet use- Always wear a helmet when riding a bicycle, motorcycle, rollerblading or skateboarding. 75. Safe sex- If you may be exposed to sexually transmitted infections, use a condom. 11. Seat belts- Seat belts can save your live; always wear one. 12. Smoke/Carbon Monoxide detectors- These detectors need to be installed on the appropriate level of your home. Replace batteries at least once a year. 13. Skin cancer- When out in the sun please cover up and use sunscreen 15 SPF or higher. 14. Violence- If anyone is threatening or hurting you, please tell your healthcare provider.

## 2014-09-17 NOTE — Progress Notes (Signed)
  Subjective:     Shirley Vasquez is a 48 y.o. female and is here for a comprehensive physical exam. The patient reports no problems.  History   Social History  . Marital Status: Married    Spouse Name: N/A    Number of Children: N/A  . Years of Education: N/A   Occupational History  . Not on file.   Social History Main Topics  . Smoking status: Never Smoker   . Smokeless tobacco: Not on file  . Alcohol Use: No  . Drug Use: No  . Sexual Activity: Not on file   Other Topics Concern  . Not on file   Social History Narrative   Health Maintenance  Topic Date Due  . INFLUENZA VACCINE  05/05/2015  . MAMMOGRAM  07/05/2015  . PAP SMEAR  10/04/2016  . TETANUS/TDAP  04/03/2021    The following portions of the patient's history were reviewed and updated as appropriate: allergies, current medications, past family history, past medical history, past social history, past surgical history and problem list.  Review of Systems A comprehensive review of systems was negative.   Objective:    BP 121/69 mmHg  Pulse 82  Ht 5\' 3"  (1.6 m)  Wt 179 lb (81.194 kg)  BMI 31.72 kg/m2  SpO2 97% General appearance: alert, cooperative and appears stated age Head: Normocephalic, without obvious abnormality, atraumatic Eyes: conjunctivae/corneas clear. PERRL, EOM's intact. Fundi benign. Ears: normal TM and external ear canal right ear and abnormal TM left ear - erythematous Nose: Nares normal. Septum midline. Mucosa normal. No drainage or sinus tenderness. Throat: lips, mucosa, and tongue normal; teeth and gums normal Neck: no adenopathy, no carotid bruit, no JVD, supple, symmetrical, trachea midline and thyroid not enlarged, symmetric, no tenderness/mass/nodules Back: symmetric, no curvature. ROM normal. No CVA tenderness. Lungs: clear to auscultation bilaterally Heart: regular rate and rhythm, S1, S2 normal, no murmur, click, rub or gallop Abdomen: soft, non-tender; bowel sounds normal; no  masses,  no organomegaly Extremities: extremities normal, atraumatic, no cyanosis or edema Pulses: 2+ and symmetric Skin: Skin color, texture, turgor normal. No rashes or lesions Lymph nodes: Cervical, supraclavicular, and axillary nodes normal. Neurologic: Grossly normal    Assessment:    Healthy female exam.      Plan:    CPE- pap up to date. Mammogram up to date. Will get fasting labs. Discussed vitamin D and calcium intake. Continue regular exercise. CPE HO given.   Left ear erythema- she has ongoing ear pain and problems pt has ENT. Suggest to use acetic acid ear drops and will refer to allergy testing. Continue on claritin and flonase. If ear pain worsening please call office pt has hx of ear infections developing.  See After Visit Summary for Counseling Recommendations

## 2014-11-08 ENCOUNTER — Encounter: Payer: Self-pay | Admitting: Physician Assistant

## 2014-11-08 ENCOUNTER — Ambulatory Visit (INDEPENDENT_AMBULATORY_CARE_PROVIDER_SITE_OTHER): Payer: Managed Care, Other (non HMO) | Admitting: Physician Assistant

## 2014-11-08 VITALS — BP 141/66 | HR 99 | Ht 63.0 in | Wt 172.0 lb

## 2014-11-08 DIAGNOSIS — F410 Panic disorder [episodic paroxysmal anxiety] without agoraphobia: Secondary | ICD-10-CM

## 2014-11-08 DIAGNOSIS — F419 Anxiety disorder, unspecified: Secondary | ICD-10-CM

## 2014-11-08 MED ORDER — SERTRALINE HCL 100 MG PO TABS: 150.0000 mg | ORAL_TABLET | Freq: Every day | ORAL | Status: DC

## 2014-11-08 MED ORDER — CLONAZEPAM 1 MG PO TABS: 1.0000 mg | ORAL_TABLET | Freq: Two times a day (BID) | ORAL | Status: DC | PRN

## 2014-11-08 MED ORDER — BUPROPION HCL ER (XL) 150 MG PO TB24: 150.0000 mg | ORAL_TABLET | ORAL | Status: DC

## 2014-11-08 NOTE — Progress Notes (Signed)
   Subjective:    Patient ID: Shirley Vasquez, female    DOB: 29-Jul-1966, 49 y.o.   MRN: [id number]  HPI  Pt presents to the clinic with worsening anxiety. Her mother is going through some health problems and is not treating her illness as she would like. She feels a lot of stress to help her but doesn't feel like she can. Pt was going to counseling but stopped. She is working out 5-6 times a week which helps. Continues to take zoloft daily. buspar was tried but gave her headaches. January has been a bad month. She is starting to feel like she is going to have panic attacks. She is taking 1/2 klonapin on average twice a day. klonapin does help but she does not want to become dependent.     Review of Systems  All other systems reviewed and are negative.      Objective:   Physical Exam  Constitutional: She is oriented to person, place, and time. She appears well-developed and well-nourished.  HENT:  Head: Normocephalic and atraumatic.  Cardiovascular: Normal rate, regular rhythm and normal heart sounds.   Pulmonary/Chest: Effort normal and breath sounds normal.  Neurological: She is alert and oriented to person, place, and time.  Skin: Skin is dry.  Psychiatric: She has a normal mood and affect. Her behavior is normal.          Assessment & Plan:  Anxiety/panic attacks- continue zoloft. Added wellbutrin daily. Discussed side effects. Refilled klonapin. Continue to use as needed but goal is to decrease. Continue exercise. Consider counseling again. Follow up in 6 weeks.

## 2014-11-27 ENCOUNTER — Telehealth: Payer: Self-pay | Admitting: *Deleted

## 2014-11-27 ENCOUNTER — Other Ambulatory Visit: Payer: Self-pay | Admitting: *Deleted

## 2014-11-27 NOTE — Telephone Encounter (Signed)
i would say to give it a little longer unless symptoms start to worsen. A lot of time when starting meds can have more side effects. As always if increasing stop and let me know. Do not try any longer than 2-3 weeks if symptoms continue.

## 2014-11-27 NOTE — Telephone Encounter (Signed)
Pt called back and left another message stating that she's also had an itchy rash since last Fri. Will call and tell her to stop it.

## 2014-11-27 NOTE — Telephone Encounter (Signed)
Pt notified to stop wellbutrin.  She will call back in a week or 2 for [place] to start another treatment plan.  I advised her that she could take a benedryl for her rash if she needed to.  Wellbutrin added to allergy list.

## 2014-11-27 NOTE — Telephone Encounter (Signed)
Yes stop wellbutrin. Please add to intolerance list. Let's give some time to get out of system 1-2 weeks then consider trying Viibryd with zoloft or considering stopping zoloft and starting Brintellix. Pt can choose.

## 2014-11-27 NOTE — Telephone Encounter (Signed)
Pt called stating that since starting the wellbutrin she's noticing that she's having random chills and some dry mouth which she read could be possible side effects.  She said that she's willing to give it a few more days unless you think the med needs to be changed.

## 2014-12-10 ENCOUNTER — Telehealth: Payer: Self-pay | Admitting: *Deleted

## 2014-12-10 NOTE — Telephone Encounter (Signed)
I was thinking we would replace zoloft with viibryd next. Is this something you would like to try?

## 2014-12-10 NOTE — Telephone Encounter (Signed)
She is ok with anything you want to try.

## 2014-12-10 NOTE — Telephone Encounter (Signed)
Shirley Vasquez left vm stating that she's been off the wellbutrin for a couple weeks now.  You wanted her to stop it for 1-2 weeks before you sent something else in for her to try.

## 2014-12-11 ENCOUNTER — Other Ambulatory Visit: Payer: Self-pay | Admitting: Physician Assistant

## 2014-12-11 MED ORDER — VILAZODONE HCL 20 MG PO TABS: ORAL_TABLET | ORAL | Status: DC

## 2014-12-11 NOTE — Telephone Encounter (Signed)
Ok will send viibryd.  Taper off zoloft 1/2 tablet for 7 days with 10mg  of viibryd. Then Stop zoloft and start 20mg  of viibryd.   Needs to get card for decreased cost.  i will send viibryd to pharmacy.

## 2014-12-12 NOTE — Telephone Encounter (Signed)
Pt notified of new rx & taper instructions.

## 2014-12-26 ENCOUNTER — Telehealth: Payer: Self-pay | Admitting: *Deleted

## 2014-12-26 NOTE — Telephone Encounter (Signed)
Pt called stating that the viibryd has been giving her bad migraines.  She's only been doing the half tab of the 20 mg while she was tapering the zoloft.  I advised her to not take the viibryd over the weekend and just continue the zoloft until I could tell her differently on Monday.

## 2014-12-30 NOTE — Telephone Encounter (Signed)
Ok not a common side effect of vibryd but go ahead and stop. Let's go back up to 150mg  of zoloft. Have we every tried buspar three times daily. I did not see in med list. Can take with zoloft.

## 2014-12-31 ENCOUNTER — Other Ambulatory Visit: Payer: Self-pay | Admitting: *Deleted

## 2014-12-31 MED ORDER — SERTRALINE HCL 100 MG PO TABS: 150.0000 mg | ORAL_TABLET | Freq: Every day | ORAL | Status: DC

## 2014-12-31 NOTE — Telephone Encounter (Signed)
I feel like going back on zoloft is just going back to what has not worked in the past. What about Brintellix? Would she like to try this?

## 2014-12-31 NOTE — Telephone Encounter (Signed)
Spoke with pt and you actually put her on buspar back on 07/24/14 but it gave her headaches as well according to her 11/08/14 visit.  I refilled her zoloft 150mg  and advised her to f/u in 1 month.

## 2015-01-02 NOTE — Telephone Encounter (Signed)
Pt wants to try the Brintellix

## 2015-01-06 ENCOUNTER — Telehealth: Payer: Self-pay | Admitting: Physician Assistant

## 2015-01-06 ENCOUNTER — Encounter: Payer: Self-pay | Admitting: Physician Assistant

## 2015-01-06 ENCOUNTER — Ambulatory Visit (INDEPENDENT_AMBULATORY_CARE_PROVIDER_SITE_OTHER): Payer: Managed Care, Other (non HMO) | Admitting: Physician Assistant

## 2015-01-06 VITALS — BP 124/74 | HR 93 | Wt 167.0 lb

## 2015-01-06 DIAGNOSIS — F4541 Pain disorder exclusively related to psychological factors: Secondary | ICD-10-CM

## 2015-01-06 DIAGNOSIS — F331 Major depressive disorder, recurrent, moderate: Secondary | ICD-10-CM | POA: Diagnosis not present

## 2015-01-06 MED ORDER — VORTIOXETINE HBR 10 MG PO TABS
1.0000 | ORAL_TABLET | Freq: Every day | ORAL | Status: DC
Start: 2015-01-06 — End: 2015-01-08

## 2015-01-06 NOTE — Progress Notes (Signed)
   Subjective:    Patient ID: Shirley Vasquez, female    DOB: Jun 25, 1966, 49 y.o.   MRN: [id number]  HPI  Patient presents to the clinic to follow-up on depression and anxiety. She feels like she has been struggling for the last couple months. We have tried Wellbutrin and Viibryd in combination with Zoloft. She had a rash with Wellbutrin and with viibryd thought she was developing headaches. She has been off viibryd for over a week and headaches have persisted for at least 2-3 times a week and last for approximately an hour. If she took Tylenol they do resolve. She denies any vision changes. She doesn't have any vomiting but some mild nausea. She doesn't really have an appetite. She may have some light sensitivity. She describes the headaches over the tension and feels like her head being squeezed. She denies any major life events or cause for depression. She continues to use Klonopin as needed every couple days. She is seeing a counselor regularly at the [facility].   Review of Systems  All other systems reviewed and are negative.      Objective:   Physical Exam  Constitutional: She is oriented to person, place, and time. She appears well-developed and well-nourished.  HENT:  Head: Normocephalic and atraumatic.  Cardiovascular: Normal rate, regular rhythm and normal heart sounds.   Pulmonary/Chest: Effort normal and breath sounds normal.  Neurological: She is alert and oriented to person, place, and time.  Skin: Skin is warm and dry.  Psychiatric:  Very tearful.           Assessment & Plan:  Major depression/anxiety/panic attacks/stress headaches- PHQ-9 was 11. GAD-7 was 8. Pt will continue to meet with counselor at the [facility]. She has not responded to norepinephrine medications in past so will stick to more serotonin based medications. Offered to increase zoloft and add abilify or try brintellix. She wants to try brintellix. Will stop zoloft and start brintellix. Call with any side  effects. klonapin as needed.  I do think headaches are more from stress and tension. Tylenol as needed. if not responding or worsening follow up.Follow up in 3 weeks.

## 2015-01-06 NOTE — Telephone Encounter (Signed)
Received fax for pa on Brintellix sent through cover my meds waiting on auth. - CF

## 2015-01-06 NOTE — Patient Instructions (Signed)
[place] by Marlise Eves.

## 2015-01-07 ENCOUNTER — Telehealth: Payer: Self-pay | Admitting: Physician Assistant

## 2015-01-07 NOTE — Telephone Encounter (Signed)
Received fax from [place] [facility] was approved from 01/07/2015 - 01/07/2016 - CF

## 2015-01-07 NOTE — Telephone Encounter (Signed)
Shirley Vasquez,     Even with the discount card on the medication is still going to cost Shirley Vasquez 289.00 because she has a 3000.00 deductable. She called and was just wondering if there was anything else we could do she said if she has to pay that then she would and wanted you to know she does really appreciate all you have done for her and she doesn't want to be a bother. Please advise and I will call her back. - CF

## 2015-01-08 ENCOUNTER — Other Ambulatory Visit: Payer: Self-pay | Admitting: Physician Assistant

## 2015-01-08 MED ORDER — DESVENLAFAXINE SUCCINATE ER 50 MG PO TB24: 50.0000 mg | ORAL_TABLET | Freq: Every day | ORAL | Status: DC

## 2015-01-08 NOTE — Telephone Encounter (Signed)
Sent pristiq. Start 1/2 tablet daily with 1/2 of zoloft for 7 days then stop zoloft and start 1 full tablet of pristiq.

## 2015-01-09 ENCOUNTER — Other Ambulatory Visit: Payer: Self-pay | Admitting: Physician Assistant

## 2015-01-09 LAB — VITAMIN D 25 HYDROXY (VIT D DEFICIENCY, FRACTURES): VIT D 25 HYDROXY: 20 ng/mL — AB (ref 30–100)

## 2015-01-09 LAB — COMPLETE METABOLIC PANEL WITH GFR
ALK PHOS: 58 U/L (ref 39–117)
ALT: 24 U/L (ref 0–35)
AST: 18 U/L (ref 0–37)
Albumin: 3.8 g/dL (ref 3.5–5.2)
BILIRUBIN TOTAL: 0.6 mg/dL (ref 0.2–1.2)
BUN: 12 mg/dL (ref 6–23)
CO2: 26 mEq/L (ref 19–32)
CREATININE: 0.7 mg/dL (ref 0.50–1.10)
Calcium: 8.8 mg/dL (ref 8.4–10.5)
Chloride: 105 mEq/L (ref 96–112)
GFR, Est African American: >89 mL/min
GLUCOSE: 84 mg/dL (ref 70–99)
Potassium: 4.3 mEq/L (ref 3.5–5.3)
Sodium: 140 mEq/L (ref 135–145)
Total Protein: 6.4 g/dL (ref 6.0–8.3)

## 2015-01-09 LAB — LIPID PANEL
CHOL/HDL RATIO: 3.7 ratio
CHOLESTEROL: 216 mg/dL — AB (ref 0–200)
HDL: 58 mg/dL (ref >=46)
LDL Cholesterol: 143 mg/dL — ABNORMAL HIGH (ref 0–99)
Triglycerides: 76 mg/dL (ref <150)
VLDL: 15 mg/dL (ref 0–40)

## 2015-01-09 MED ORDER — VITAMIN D (ERGOCALCIFEROL) 1.25 MG (50000 UNIT) PO CAPS: 50000.0000 [IU] | ORAL_CAPSULE | ORAL | Status: DC

## 2015-01-09 NOTE — Telephone Encounter (Signed)
Pt called in today to let us know that she talked with someone at her ins company and they are suggesting that she try at least one other drug similar to pristiq.  Their suggestions were fluoxetine, trazadone, nortriptyline, or amitriptyline.  I told her that none of these were on her med hx or allergy list so I would let you make a decision on what to send her.

## 2015-01-09 NOTE — Telephone Encounter (Signed)
Received fax for pa on pristiq sent through cover my meds waiting on auth. - CF

## 2015-01-10 ENCOUNTER — Other Ambulatory Visit: Payer: Self-pay | Admitting: Physician Assistant

## 2015-01-10 MED ORDER — FLUOXETINE HCL 20 MG PO TABS: 20.0000 mg | ORAL_TABLET | Freq: Every day | ORAL | Status: DC

## 2015-01-10 NOTE — Telephone Encounter (Signed)
Received fax from [facility] and the prescription for pristiq tablets were denied due to not meeting medical necessity - CF

## 2015-01-10 NOTE — Telephone Encounter (Signed)
Ok will try prozac 20mg  start 1/2 tablet with zoloft 50mg  1/2 tablet for 7 days then stop zoloft and continue on full tablet of prozac. Follow up in one month or if symptoms worsening or new side effects.   i will send to pharmacy.

## 2015-01-27 ENCOUNTER — Encounter: Payer: Self-pay | Admitting: Physician Assistant

## 2015-01-27 ENCOUNTER — Ambulatory Visit (INDEPENDENT_AMBULATORY_CARE_PROVIDER_SITE_OTHER): Payer: Managed Care, Other (non HMO) | Admitting: Physician Assistant

## 2015-01-27 VITALS — BP 139/82 | HR 84 | Ht 63.0 in | Wt 166.0 lb

## 2015-01-27 DIAGNOSIS — F419 Anxiety disorder, unspecified: Secondary | ICD-10-CM

## 2015-01-27 DIAGNOSIS — F410 Panic disorder [episodic paroxysmal anxiety] without agoraphobia: Secondary | ICD-10-CM | POA: Diagnosis not present

## 2015-01-27 DIAGNOSIS — B372 Candidiasis of skin and nail: Secondary | ICD-10-CM | POA: Diagnosis not present

## 2015-01-27 MED ORDER — NYSTATIN 100000 UNIT/GM EX CREA: 1.0000 "application " | TOPICAL_CREAM | Freq: Two times a day (BID) | CUTANEOUS | Status: DC

## 2015-01-27 MED ORDER — FLUOXETINE HCL 20 MG PO TABS: 20.0000 mg | ORAL_TABLET | Freq: Every day | ORAL | Status: DC

## 2015-01-27 MED ORDER — ESOMEPRAZOLE MAGNESIUM 40 MG PO CPDR: DELAYED_RELEASE_CAPSULE | ORAL | Status: DC

## 2015-01-27 MED ORDER — DOXYCYCLINE HYCLATE 100 MG PO TABS: 100.0000 mg | ORAL_TABLET | Freq: Two times a day (BID) | ORAL | Status: DC

## 2015-01-27 MED ORDER — CLONAZEPAM 1 MG PO TABS: 1.0000 mg | ORAL_TABLET | Freq: Two times a day (BID) | ORAL | Status: DC | PRN

## 2015-01-27 NOTE — Progress Notes (Signed)
   Subjective:    Patient ID: Shirley Vasquez, female    DOB: [birth year], 49 y.o.   MRN: [id number]  HPI Pt presents to the clinic to follow up on anxiety and depression and start of prozac. She is doing much better. She is not crying every day. She is working with Social worker. She is exercising daily. She denies any suicidal thoughts or homicidal thoughts.   She also noticed a red, irritated, oozing area under her "fat role". She hasn't tried anything but keeping clean. Itches a little. Seems to be getting bigger. No fever or chills.    Review of Systems  All other systems reviewed and are negative.      Objective:   Physical Exam  Constitutional: She is oriented to person, place, and time. She appears well-developed and well-nourished.  HENT:  Head: Normocephalic and atraumatic.  Cardiovascular: Normal rate, regular rhythm and normal heart sounds.   Pulmonary/Chest: Effort normal and breath sounds normal.  Neurological: She is alert and oriented to person, place, and time.  Skin: Skin is dry.     Psychiatric: She has a normal mood and affect. Her behavior is normal.          Assessment & Plan:  GAD/panic attacks- GAD-7 was 1. PHQ-9 was 3. Doing great. Refilled prozac and klonapin for 6 months. Dose of prozac to stay the same today but can be increased to 1 and 1/2 tablets if needed. Continue counseling and exercise.   Intertrigo candidiasis- keep area dry. Appears a little infected today. Sent doxycycline for 10 days and nystatin cream for next week. HO given for prevention.

## 2015-03-13 ENCOUNTER — Encounter: Payer: Self-pay | Admitting: Sports Medicine

## 2015-03-13 ENCOUNTER — Ambulatory Visit (INDEPENDENT_AMBULATORY_CARE_PROVIDER_SITE_OTHER): Payer: Managed Care, Other (non HMO) | Admitting: Sports Medicine

## 2015-03-13 VITALS — BP 122/68 | HR 72 | Wt 164.0 lb

## 2015-03-13 DIAGNOSIS — G5760 Lesion of plantar nerve, unspecified lower limb: Secondary | ICD-10-CM | POA: Diagnosis not present

## 2015-03-13 DIAGNOSIS — G5601 Carpal tunnel syndrome, right upper limb: Secondary | ICD-10-CM | POA: Diagnosis not present

## 2015-03-13 NOTE — Assessment & Plan Note (Signed)
With splaying of the right 3/4 toes. And pain and numbness at the left 2/3 interspace and the right 3/4 interspace. Return for custom orthotics with metatarsal pads. Bilateral x-rays.

## 2015-03-13 NOTE — Assessment & Plan Note (Signed)
We will start with nighttime splinting, she did have a strongly positive Phalen's and Tinel's sign. Return in one month regarding carpal tunnel syndrome, median nerve hydrodissection under ultrasound guidance if no better.

## 2015-03-13 NOTE — Progress Notes (Signed)
   Subjective:    I'm seeing this patient as a consultation for: Iran Planas, PA-C   CC: Right hand pain, bilateral foot pain  HPI: For the past several months this pleasant 49 year old female has noted increasing pain, pressure, and tingling with loss of sensation in her right third and fourth fingers, to the point where she is dropping things. She has a bit of nocturnal pain, and mild radiation to the forearm, pain is moderate, persistent.  Foot pain: Right 3/4 and left 2/3 with numbness and tingling, and pain under the metatarsals, with splaying of the right 3/4 toes.  Past medical history, Surgical history, Family history not pertinant except as noted below, Social history, Allergies, and medications have been entered into the medical record, reviewed, and no changes needed.   Review of Systems: No headache, visual changes, nausea, vomiting, diarrhea, constipation, dizziness, abdominal pain, skin rash, fevers, chills, night sweats, weight loss, swollen lymph nodes, body aches, joint swelling, muscle aches, chest pain, shortness of breath, mood changes, visual or auditory hallucinations.   Objective:   General: Well Developed, well nourished, and in no acute distress.  Neuro/Psych: Alert and oriented x3, extra-ocular muscles intact, able to move all 4 extremities, sensation grossly intact. Skin: Warm and dry, no rashes noted.  Respiratory: Not using accessory muscles, speaking in full sentences, trachea midline.  Cardiovascular: Pulses palpable, no extremity edema. Abdomen: Does not appear distended. Right Wrist: Inspection normal with no visible erythema or swelling. ROM smooth and normal with good flexion and extension and ulnar/radial deviation that is symmetrical with opposite wrist. Palpation is normal over metacarpals, navicular, lunate, and TFCC; tendons without tenderness/ swelling No snuffbox tenderness. No tenderness over Canal of Guyon. Strength 5/5 in all directions  without pain. Positive Tinel's and Phalen sign Negative Watson's test. Bilateral feet: No visible erythema or swelling. Range of motion is full in all directions. Strength is 5/5 in all directions. No hallux valgus. No pes cavus or pes planus. No abnormal callus noted. No pain over the navicular prominence, or base of fifth metatarsal. No tenderness to palpation of the calcaneal insertion of plantar fascia. No pain at the Achilles insertion. No pain over the calcaneal bursa. No pain of the retrocalcaneal bursa. No tenderness to palpation over the tarsals, metatarsals, or phalanges. No hallux rigidus or limitus. No tenderness palpation over interphalangeal joints. No pain with compression of the metatarsal heads. Neurovascularly intact distally. Splaying of the right 3/4 toes, tenderness at the right 3/4 intermetatarsal joint as well as the left 2/3 intermetatarsal joint.  Impression and Recommendations:   This case required medical decision making of moderate complexity.

## 2015-04-04 ENCOUNTER — Ambulatory Visit: Payer: Managed Care, Other (non HMO) | Admitting: Sports Medicine

## 2015-04-15 ENCOUNTER — Encounter: Payer: Managed Care, Other (non HMO) | Admitting: Sports Medicine

## 2015-05-19 ENCOUNTER — Ambulatory Visit (INDEPENDENT_AMBULATORY_CARE_PROVIDER_SITE_OTHER): Payer: Managed Care, Other (non HMO) | Admitting: Sports Medicine

## 2015-05-19 ENCOUNTER — Encounter: Payer: Self-pay | Admitting: Sports Medicine

## 2015-05-19 VITALS — BP 122/71 | HR 76 | Ht 63.0 in | Wt 165.0 lb

## 2015-05-19 DIAGNOSIS — G5760 Lesion of plantar nerve, unspecified lower limb: Secondary | ICD-10-CM

## 2015-05-19 DIAGNOSIS — G5601 Carpal tunnel syndrome, right upper limb: Secondary | ICD-10-CM | POA: Diagnosis not present

## 2015-05-19 DIAGNOSIS — G5602 Carpal tunnel syndrome, left upper limb: Secondary | ICD-10-CM | POA: Diagnosis not present

## 2015-05-19 DIAGNOSIS — G5603 Carpal tunnel syndrome, bilateral upper limbs: Secondary | ICD-10-CM

## 2015-05-19 NOTE — Assessment & Plan Note (Signed)
With splaying of the right 3/4 toes, and pain and numbness of the left 2/3 interspace in the right 3/4 interspace. X-rays at an outside facility were overall unremarkable, needs to return for custom orthotics. Metatarsal pads.

## 2015-05-19 NOTE — Assessment & Plan Note (Signed)
Nerve conduction confirm. Has failed nighttime splinting. She did fill the standard injection by another orthopedist. Shirley Medin do a bilateral median nerve hydrodissection under ultrasound guidance today. She does need to do bilateral nighttime splinting and will need new splint for her left side.

## 2015-05-19 NOTE — Progress Notes (Signed)
  Subjective:    CC: Follow-up  HPI: Bilateral carpal tunnel syndrome: Went to another orthopedist, right side was injected without much response, carpal tunnel syndrome is EMG confirmed. She has improved slightly with nighttime splinting bilaterally. At this point she does desire interventional treatment today. Symptoms are moderate, persistent with median nerve distribution paresthesias.  Moore's neuroma: Bilateral, has not yet come back for custom orthotics. She did have x-rays done at [facility] which were unremarkable.  Past medical history, Surgical history, Family history not pertinant except as noted below, Social history, Allergies, and medications have been entered into the medical record, reviewed, and no changes needed.   Review of Systems: No fevers, chills, night sweats, weight loss, chest pain, or shortness of breath.   Objective:    General: Well Developed, well nourished, and in no acute distress.  Neuro: Alert and oriented x3, extra-ocular muscles intact, sensation grossly intact.  HEENT: Normocephalic, atraumatic, pupils equal round reactive to light, neck supple, no masses, no lymphadenopathy, thyroid nonpalpable.  Skin: Warm and dry, no rashes. Cardiac: Regular rate and rhythm, no murmurs rubs or gallops, no lower extremity edema.  Respiratory: Clear to auscultation bilaterally. Not using accessory muscles, speaking in full sentences.  Procedure: Real-time Ultrasound Guided right median nerve hydrodissection Device: GE Logiq E  Verbal informed consent obtained.  Time-out conducted.  Noted no overlying erythema, induration, or other signs of local infection.  Skin prepped in a sterile fashion.  Local anesthesia: Topical Ethyl chloride.  With sterile technique and under real time ultrasound guidance:  Visualized the median nerve, it did appear enlarged, I injected a total of 1 mL kenalog 40, 4 mL lidocaine with superficial to and deep to the nerve freeing  it from surrounding structures, the needle was redirected deep with into the carpal tunnel and medication was injected around the 9 deep tendons. Completed without difficulty  Pain immediately resolved suggesting accurate placement of the medication.  Advised to call if fevers/chills, erythema, induration, drainage, or persistent bleeding.  Images permanently stored and available for review in the ultrasound unit.  Impression: Technically successful ultrasound guided injection.  Procedure: Real-time Ultrasound Guided left median nerve hydrodissection Device: GE Logiq E  Verbal informed consent obtained.  Time-out conducted.  Noted no overlying erythema, induration, or other signs of local infection.  Skin prepped in a sterile fashion.  Local anesthesia: Topical Ethyl chloride.  With sterile technique and under real time ultrasound guidance:  Visualized the median nerve, it did appear enlarged, I injected a total of 1 mL kenalog 40, 4 mL lidocaine with superficial to and deep to the nerve freeing it from surrounding structures, the needle was redirected deep with into the carpal tunnel and medication was injected around the 9 deep tendons. Completed without difficulty  Pain immediately resolved suggesting accurate placement of the medication.  Advised to call if fevers/chills, erythema, induration, drainage, or persistent bleeding.  Images permanently stored and available for review in the ultrasound unit.  Impression: Technically successful ultrasound guided injection.  Impression and Recommendations:

## 2015-06-11 ENCOUNTER — Encounter: Payer: Self-pay | Admitting: Sports Medicine

## 2015-06-11 ENCOUNTER — Ambulatory Visit (INDEPENDENT_AMBULATORY_CARE_PROVIDER_SITE_OTHER): Payer: Managed Care, Other (non HMO) | Admitting: Sports Medicine

## 2015-06-11 VITALS — BP 113/72 | HR 72 | Wt 162.0 lb

## 2015-06-11 DIAGNOSIS — G5601 Carpal tunnel syndrome, right upper limb: Secondary | ICD-10-CM | POA: Diagnosis not present

## 2015-06-11 DIAGNOSIS — G5760 Lesion of plantar nerve, unspecified lower limb: Secondary | ICD-10-CM

## 2015-06-11 DIAGNOSIS — G5603 Carpal tunnel syndrome, bilateral upper limbs: Secondary | ICD-10-CM

## 2015-06-11 DIAGNOSIS — G5602 Carpal tunnel syndrome, left upper limb: Secondary | ICD-10-CM

## 2015-06-11 NOTE — Assessment & Plan Note (Signed)
Morton's neuroma on the right at 3/4 interspace and on the left and 2/3with splaying of the toes. Custom orthotics metatarsal pads placed today. Return in one month.

## 2015-06-11 NOTE — Progress Notes (Signed)

## 2015-06-11 NOTE — Assessment & Plan Note (Signed)
Fantastic response to bilateral median nerve hydrodissection ultrasound guidance, return as needed for this.

## 2015-06-29 ENCOUNTER — Encounter: Payer: Self-pay | Admitting: Emergency Medicine

## 2015-06-29 ENCOUNTER — Emergency Department (INDEPENDENT_AMBULATORY_CARE_PROVIDER_SITE_OTHER): Payer: Managed Care, Other (non HMO)

## 2015-06-29 ENCOUNTER — Emergency Department (INDEPENDENT_AMBULATORY_CARE_PROVIDER_SITE_OTHER)
Admission: EM | Admit: 2015-06-29 | Discharge: 2015-06-29 | Disposition: A | Discharge status: Home / Self Care | Payer: Managed Care, Other (non HMO) | Source: Home / Self Care | Attending: Family Medicine | Admitting: Family Medicine

## 2015-06-29 DIAGNOSIS — Z8709 Personal history of other diseases of the respiratory system: Secondary | ICD-10-CM

## 2015-06-29 DIAGNOSIS — R1031 Right lower quadrant pain: Secondary | ICD-10-CM

## 2015-06-29 DIAGNOSIS — R1032 Left lower quadrant pain: Secondary | ICD-10-CM

## 2015-06-29 DIAGNOSIS — R05 Cough: Secondary | ICD-10-CM

## 2015-06-29 DIAGNOSIS — R059 Cough, unspecified: Secondary | ICD-10-CM

## 2015-06-29 LAB — POCT URINALYSIS DIP (MANUAL ENTRY)
Bilirubin, UA: NEGATIVE
Blood, UA: NEGATIVE
Glucose, UA: NEGATIVE
Ketones, POC UA: NEGATIVE
Leukocytes, UA: NEGATIVE
Nitrite, UA: NEGATIVE
Protein Ur, POC: NEGATIVE
Spec Grav, UA: 1.015 (ref 1.005–1.03)
Urobilinogen, UA: 0.2 (ref 0–1)
pH, UA: 7 (ref 5–8)

## 2015-06-29 MED ORDER — ALBUTEROL SULFATE HFA 108 (90 BASE) MCG/ACT IN AERS: 1.0000 | INHALATION_SPRAY | Freq: Four times a day (QID) | RESPIRATORY_TRACT | Status: DC | PRN

## 2015-06-29 MED ORDER — PREDNISONE 20 MG PO TABS: ORAL_TABLET | ORAL | Status: DC

## 2015-06-29 MED ORDER — AZITHROMYCIN 250 MG PO TABS: 250.0000 mg | ORAL_TABLET | Freq: Every day | ORAL | Status: DC

## 2015-06-29 NOTE — ED Notes (Signed)
Patient presents to [facility] with C/O dry cough with sore throat and  hoarse voice times 7 days left ear with slight redness throat also appears slightly red with small amount of drainage noted. Denies fever but states generalized body aches. Patient also C/O lower abd pain. For 2 days. States stool appears seedy without having diarrhea.

## 2015-06-29 NOTE — ED Notes (Signed)
To x-ray

## 2015-06-29 NOTE — Discharge Instructions (Signed)
°  Please take antibiotics as prescribed and be sure to complete entire course even if you start to feel better to ensure infection does not come back. ° °

## 2015-06-29 NOTE — ED Provider Notes (Signed)
CSN: [id number]     Arrival date & time 06/29/15  1224 History   First MD Initiated Contact with Patient 06/29/15 1245     Chief Complaint  Patient presents with  . Cough   (Consider location/radiation/quality/duration/timing/severity/associated sxs/prior Treatment) HPI  Pt is a 49yo female with hx of asthma, presenting to [facility] with c/o dry intermittent cough for 1 week with associated sore throat, Left ear pain, hoarse voice, and body aches.  She has been taking Claritin w/o relief.  Pt also notes she developed mild intermittent lower abdominal pain described as light cramping/sharp pain.  Pt does note urinary frequency but denies pain with urination or blood in urine. She has had loose 3-4 episodes of loose stool in 2 days but no blood or mucous in still. Denies fever, chills, n/v/d. Denies sick contacts or recent travel. Pt does have an albuterol inhaler but states she is almost out. Pt c/o mild centralized chest soreness when she coughs but denies SOB. Denies CP at this time.  Past Medical History  Diagnosis Date  . Anxiety   . Fibroid     Uterine ablation  . GERD (gastroesophageal reflux disease)   . Asthma    Past Surgical History  Procedure Laterality Date  . Cholecystectomy    . Bladder suspension    . Tonsillectomy and adenoidectomy    . Tubal ligation    . Uternine ablasion    . Wisdom tooth extraction     Family History  Problem Relation Age of Onset  . Cancer Mother     breast  . Heart failure Mother   . Hypertension Mother   . Heart attack Mother    Social History  Substance Use Topics  . Smoking status: Never Smoker   . Smokeless tobacco: None  . Alcohol Use: No   OB History    No data available     Review of Systems  Constitutional: Negative for fever and chills.  HENT: Positive for ear pain, sore throat and voice change. Negative for congestion and trouble swallowing.   Respiratory: Positive for cough. Negative for shortness of breath.    Cardiovascular: Negative for chest pain and palpitations.  Gastrointestinal: Positive for abdominal pain. Negative for nausea, vomiting and diarrhea.  Genitourinary: Positive for frequency. Negative for dysuria, hematuria and pelvic pain.  Musculoskeletal: Positive for myalgias and arthralgias. Negative for back pain.  Skin: Negative for rash.  All other systems reviewed and are negative.   Allergies  Amoxicillin; Augmentin; Buspar; Codeine; Hctz; Sulfa antibiotics; and Wellbutrin  Home Medications   Prior to Admission medications   Medication Sig Start Date End Date Taking? Authorizing Provider  albuterol (PROVENTIL HFA;VENTOLIN HFA) 108 (90 BASE) MCG/ACT inhaler Inhale 2 puffs into the lungs every 6 (six) hours as needed for wheezing. 03/01/14   Donella Stade, PA-C  albuterol (PROVENTIL HFA;VENTOLIN HFA) 108 (90 BASE) MCG/ACT inhaler Inhale 1-2 puffs into the lungs every 6 (six) hours as needed for wheezing or shortness of breath. 06/29/15   Noland Fordyce, PA-C  azithromycin (ZITHROMAX) 250 MG tablet Take 1 tablet (250 mg total) by mouth daily. Take first 2 tablets together, then 1 every day until finished. 06/29/15   Noland Fordyce, PA-C  clonazePAM (KLONOPIN) 1 MG tablet Take 1 tablet (1 mg total) by mouth 2 (two) times daily as needed for anxiety. 01/27/15   Jade L Breeback, PA-C  esomeprazole (NEXIUM) 40 MG capsule TAKE ONE CAPSULE BY MOUTH EVERY DAY AT [facility] 01/27/15   Luvenia Starch  L Breeback, PA-C  FLUoxetine (PROZAC) 20 MG tablet Take 1 tablet (20 mg total) by mouth daily. 01/27/15   Jade L Breeback, PA-C  fluticasone (FLONASE) 50 MCG/ACT nasal spray Place 2 sprays into both nostrils daily. 08/20/14   Jade L Breeback, PA-C  loratadine-pseudoephedrine (CLARITIN-D 24-HOUR) 10-240 MG per 24 hr tablet Take 1 tablet by mouth daily.    Historical Provider, MD  Multiple Vitamin (MULTIVITAMIN) tablet Take 1 tablet by mouth daily.      Historical Provider, MD  predniSONE (DELTASONE) 20 MG tablet 3 tabs  po day one, then 2 po daily x 4 days 06/29/15   Noland Fordyce, PA-C  Vitamin D, Ergocalciferol, (DRISDOL) 50000 UNITS CAPS capsule Take 1 capsule (50,000 Units total) by mouth every 7 (seven) days. 01/09/15   Donella Stade, PA-C   Meds Ordered and Administered this Visit  Medications - No data to display  BP 151/88 mmHg  Temp(Src) 98.8 F (37.1 C) (Oral)  Resp 16  Ht 5\' 3"  (1.6 m)  Wt 164 lb 8 oz (74.617 kg)  BMI 29.15 kg/m2  SpO2 99% No data found.   Physical Exam  Constitutional: She appears well-developed and well-nourished. No distress.  HENT:  Head: Normocephalic and atraumatic.  Right Ear: Hearing, tympanic membrane, external ear and ear canal normal.  Left Ear: Hearing, tympanic membrane, external ear and ear canal normal.  Nose: Nose normal.  Mouth/Throat: Uvula is midline, oropharynx is clear and moist and mucous membranes are normal.  Eyes: Conjunctivae are normal. No scleral icterus.  Neck: Normal range of motion. Neck supple.  Cardiovascular: Normal rate, regular rhythm and normal heart sounds.   Pulmonary/Chest: Effort normal and breath sounds normal. No respiratory distress. She has no wheezes. She has no rales. She exhibits no tenderness.  Abdominal: Soft. She exhibits no distension and no mass. There is tenderness. There is no rebound and no guarding.  Mild lower abdominal tenderness. No rebound, guarding, or masses. No CVAT  Musculoskeletal: Normal range of motion.  Neurological: She is alert.  Skin: Skin is warm and dry. She is not diaphoretic.  Nursing note and vitals reviewed.   ED Course  Procedures (including critical care time)  Labs Review Labs Reviewed  POCT URINALYSIS DIP (MANUAL ENTRY)    Imaging Review Dg Chest 2 View  06/29/2015   CLINICAL DATA:  Dry cough, sore throat, chest tightness x1 week  EXAM: CHEST  2 VIEW  COMPARISON:  None.  FINDINGS: Lungs are clear.  No pleural effusion or pneumothorax.  The heart is normal in size.  Visualized  osseous structures are within normal limits.  Cholecystectomy clips.  IMPRESSION: Normal chest radiographs.   Electronically Signed   By: Julian Hy M.D.   On: 06/29/2015 13:18       MDM   1. Cough   2. History of asthma   3. Bilateral lower abdominal discomfort     Pt with hx of asthma presenting to [facility] with c/o cough for 1 week and newer onset lower abdominal pain and urinary frequency for 2 days. Pt appears well, non-toxic.  Vitals: WNL with O2 Sat 99% No respiratory distress on exam. No surgical abdomen on exam. UA: negative, no evidence of UTI CXR: no evidence of pleural effusion or pneumonia. Due to hx of asthma, will tx cough as asthma exacerbation secondary to possible bacterial cause. Rx: azithromycin, prednisone, and albuterol. Advised to f/u with PCP in 1 week if not improving, sooner if worsening. Discussed symptoms that warrant  emergent care in the ED, especially if abdominal symptoms worsen. Patient verbalized understanding and agreement with treatment plan.     Noland Fordyce, PA-C 06/29/15 1357

## 2015-06-29 NOTE — ED Notes (Signed)
Back from xray

## 2015-06-30 ENCOUNTER — Ambulatory Visit (INDEPENDENT_AMBULATORY_CARE_PROVIDER_SITE_OTHER): Payer: Managed Care, Other (non HMO) | Admitting: Sports Medicine

## 2015-06-30 ENCOUNTER — Encounter: Payer: Self-pay | Admitting: Sports Medicine

## 2015-06-30 VITALS — BP 123/71 | HR 72 | Ht 63.0 in | Wt 164.0 lb

## 2015-06-30 DIAGNOSIS — G5602 Carpal tunnel syndrome, left upper limb: Secondary | ICD-10-CM

## 2015-06-30 DIAGNOSIS — G5601 Carpal tunnel syndrome, right upper limb: Secondary | ICD-10-CM | POA: Diagnosis not present

## 2015-06-30 DIAGNOSIS — G5603 Carpal tunnel syndrome, bilateral upper limbs: Secondary | ICD-10-CM

## 2015-06-30 NOTE — Assessment & Plan Note (Signed)
Continued complete response on the left side, minimal finger paresthesias on the right. Repeat half dose median nerve hydrodissection as above.  Return to see me in one month.

## 2015-06-30 NOTE — Progress Notes (Signed)
  Subjective:    CC: Follow-up  HPI: This is a pleasant 49 year old female, I performed a median nerve hydrodissection over a month ago, and she had near complete resolution of symptoms with only partial persistent paresthesias in the right middle finger at the tip. Otherwise she is very happy with how things went, she is amenable to try single additional median nerve hydrodissection procedure today.  Morton's neuroma: On the right 3/4 interspace and the left 2/3 interspace, now completely resolved with custom orthotics and Morton's neuroma pads.  Past medical history, Surgical history, Family history not pertinant except as noted below, Social history, Allergies, and medications have been entered into the medical record, reviewed, and no changes needed.   Review of Systems: No fevers, chills, night sweats, weight loss, chest pain, or shortness of breath.   Objective:    General: Well Developed, well nourished, and in no acute distress.  Neuro: Alert and oriented x3, extra-ocular muscles intact, sensation grossly intact.  HEENT: Normocephalic, atraumatic, pupils equal round reactive to light, neck supple, no masses, no lymphadenopathy, thyroid nonpalpable.  Skin: Warm and dry, no rashes. Cardiac: Regular rate and rhythm, no murmurs rubs or gallops, no lower extremity edema.  Respiratory: Clear to auscultation bilaterally. Not using accessory muscles, speaking in full sentences.  Procedure: Real-time Ultrasound Guided Injection of right median nerve hydrodissection Device: GE Logiq E  Verbal informed consent obtained.  Time-out conducted.  Noted no overlying erythema, induration, or other signs of local infection.  Skin prepped in a sterile fashion.  Local anesthesia: Topical Ethyl chloride.  With sterile technique and under real time ultrasound guidance:  Using a 25-gauge needle advanced into the carpal tunnel, then using medication I injected both superficial to and deep to the median  nerve freeing it from surrounding structures and taking care to avoid intraneural injection, I then withdrew, and redirected the needle deep within the carpal tunnel, to perform a deep peritendinous injection. A total of 0.5 mL kenalog 40 and 4 mL lidocaine was used during the procedure. Completed without difficulty  Pain immediately resolved suggesting accurate placement of the medication.  Advised to call if fevers/chills, erythema, induration, drainage, or persistent bleeding.  Images permanently stored and available for review in the ultrasound unit.  Impression: Technically successful ultrasound guided injection.  Impression and Recommendations:    I spent 25 minutes with this patient, greater than 50% was face-to-face time counseling regarding the above diagnoses

## 2015-07-10 ENCOUNTER — Other Ambulatory Visit: Payer: Self-pay | Admitting: Physician Assistant

## 2015-07-23 ENCOUNTER — Ambulatory Visit (INDEPENDENT_AMBULATORY_CARE_PROVIDER_SITE_OTHER): Payer: Managed Care, Other (non HMO) | Admitting: Sports Medicine

## 2015-07-23 ENCOUNTER — Encounter: Payer: Self-pay | Admitting: Sports Medicine

## 2015-07-23 DIAGNOSIS — G5603 Carpal tunnel syndrome, bilateral upper limbs: Secondary | ICD-10-CM | POA: Diagnosis not present

## 2015-07-23 MED ORDER — GABAPENTIN 100 MG PO CAPS: ORAL_CAPSULE | ORAL | Status: DC

## 2015-07-23 NOTE — Assessment & Plan Note (Signed)
Fantastic response to 2 median nerve hydrodissection's, the left side is 100% resolved, the right side only has minimal paresthesias in the tip of the middle finger, adding low-dose gabapentin.

## 2015-07-23 NOTE — Progress Notes (Signed)
  Subjective:    CC: Follow-up  HPI: Bilateral carpal tunnel syndrome: Completely resolved on the left side after a single median nerve hydrodissection, we did a repeat low-dose right-sided median nerve hydrodissection and she is almost completely better with the exception of a small degree of paresthesias at the tip of the middle finger. Overall she is extremely happy with how things have gone and she has brought [place] some muffins.  Past medical history, Surgical history, Family history not pertinant except as noted below, Social history, Allergies, and medications have been entered into the medical record, reviewed, and no changes needed.   Review of Systems: No fevers, chills, night sweats, weight loss, chest pain, or shortness of breath.   Objective:    General: Well Developed, well nourished, and in no acute distress.  Neuro: Alert and oriented x3, extra-ocular muscles intact, sensation grossly intact.  HEENT: Normocephalic, atraumatic, pupils equal round reactive to light, neck supple, no masses, no lymphadenopathy, thyroid nonpalpable.  Skin: Warm and dry, no rashes. Cardiac: Regular rate and rhythm, no murmurs rubs or gallops, no lower extremity edema.  Respiratory: Clear to auscultation bilaterally. Not using accessory muscles, speaking in full sentences.  Impression and Recommendations:

## 2015-07-25 ENCOUNTER — Ambulatory Visit (INDEPENDENT_AMBULATORY_CARE_PROVIDER_SITE_OTHER): Payer: Managed Care, Other (non HMO) | Admitting: Family Medicine

## 2015-07-25 ENCOUNTER — Encounter: Payer: Self-pay | Admitting: Family Medicine

## 2015-07-25 VITALS — BP 125/69 | HR 65 | Temp 98.4°F | Wt 167.0 lb

## 2015-07-25 DIAGNOSIS — J0100 Acute maxillary sinusitis, unspecified: Secondary | ICD-10-CM

## 2015-07-25 MED ORDER — AZITHROMYCIN 250 MG PO TABS: ORAL_TABLET | ORAL | Status: AC

## 2015-07-25 NOTE — Progress Notes (Signed)
   Subjective:    Patient ID: Shirley Vasquez, female    DOB: 1966-01-23, 49 y.o.   MRN: [id number]  HPI C/O cough, low-grade fevers, body aches in her legs and back, sneezing and watery eyes for about 1 week. She stay on her claritn and uses her nasal spray.  2 days ago she started having some sharp abdominal pains and vomited a couple fo times. Has had dec appetite.  Throat has been burning since then.  Has had a dull headache.  No longer vomiting. She has had left facial cheek pain for about 2 weeks.  Eyes have been icthing.     Review of Systems     Objective:   Physical Exam  Constitutional: She is oriented to person, place, and time. She appears well-developed and well-nourished.  HENT:  Head: Normocephalic and atraumatic.  Right Ear: External ear normal.  Left Ear: External ear normal.  Nose: Nose normal.  Mouth/Throat: Oropharynx is clear and moist.  Right TM and canal is clear.  Tender over the left maxillary facial sinus and left forehead. Small amount of fluid behind the left TM.   Eyes: Conjunctivae and EOM are normal. Pupils are equal, round, and reactive to light.  Neck: Neck supple. No thyromegaly present.  Cardiovascular: Normal rate, regular rhythm and normal heart sounds.   Pulmonary/Chest: Effort normal and breath sounds normal. She has no wheezes.  Lymphadenopathy:    She has no cervical adenopathy.  Neurological: She is alert and oriented to person, place, and time.  Skin: Skin is warm and dry.  Psychiatric: She has a normal mood and affect.          Assessment & Plan:  Acute left maxillary sinusitis - she is allergic to penicillin and sulfa so we will treat with azithromycin. Call if not better in one week or call sooner if getting worse or develops a fever.

## 2015-07-25 NOTE — Patient Instructions (Signed)

## 2015-07-28 ENCOUNTER — Ambulatory Visit: Payer: Managed Care, Other (non HMO) | Admitting: Physician Assistant

## 2015-07-28 ENCOUNTER — Encounter: Payer: Self-pay | Admitting: Physician Assistant

## 2015-07-28 ENCOUNTER — Ambulatory Visit: Payer: Managed Care, Other (non HMO) | Admitting: Sports Medicine

## 2015-07-28 ENCOUNTER — Ambulatory Visit (INDEPENDENT_AMBULATORY_CARE_PROVIDER_SITE_OTHER): Payer: Managed Care, Other (non HMO) | Admitting: Physician Assistant

## 2015-07-28 VITALS — BP 118/70 | HR 90 | Temp 98.2°F | Ht 63.0 in | Wt 165.0 lb

## 2015-07-28 DIAGNOSIS — H6982 Other specified disorders of Eustachian tube, left ear: Secondary | ICD-10-CM

## 2015-07-28 DIAGNOSIS — Z1239 Encounter for other screening for malignant neoplasm of breast: Secondary | ICD-10-CM

## 2015-07-28 DIAGNOSIS — F419 Anxiety disorder, unspecified: Secondary | ICD-10-CM

## 2015-07-28 DIAGNOSIS — F331 Major depressive disorder, recurrent, moderate: Secondary | ICD-10-CM | POA: Diagnosis not present

## 2015-07-28 DIAGNOSIS — Z23 Encounter for immunization: Secondary | ICD-10-CM | POA: Diagnosis not present

## 2015-07-28 DIAGNOSIS — F410 Panic disorder [episodic paroxysmal anxiety] without agoraphobia: Secondary | ICD-10-CM

## 2015-07-28 MED ORDER — ESOMEPRAZOLE MAGNESIUM 40 MG PO CPDR: DELAYED_RELEASE_CAPSULE | ORAL | Status: DC

## 2015-07-28 MED ORDER — FLUOXETINE HCL 20 MG PO TABS: 20.0000 mg | ORAL_TABLET | Freq: Every day | ORAL | Status: DC

## 2015-07-28 MED ORDER — CLONAZEPAM 1 MG PO TABS: 1.0000 mg | ORAL_TABLET | Freq: Two times a day (BID) | ORAL | Status: DC | PRN

## 2015-07-28 MED ORDER — PREDNISONE 20 MG PO TABS: ORAL_TABLET | ORAL | Status: DC

## 2015-07-28 NOTE — Progress Notes (Signed)
Shirley Vasquez is a 49 y.o. female who presents to [facility]  today for a 6 month f/u on anxiety and depression. [name] states that she has been feeling great lately and is especially pleased that she continues to feel this way during this time of year as she usually has a hard time during the fall and winter months. She has reduced her Klonopin use to about 6 pills per week and states that she only uses them when she feels very anxious. She continues to exercise and states that she can now exercise 5-6 times per week after receiving orthotics from Dr. Darene Lamer recently. She denies any suicidal or homicidal thoughts, problems sleeping, nausea, vomiting, diarrhea or constipation.  Additionally, she is concerned that the fluid behind her left ear drum has not cleared and would for [place] to check it.   GAD-7 = 0, PHQ-9 = 0    Past Medical History  Diagnosis Date  . Anxiety   . Fibroid     Uterine ablation  . GERD (gastroesophageal reflux disease)   . Asthma    Past Surgical History  Procedure Laterality Date  . Cholecystectomy    . Bladder suspension    . Tonsillectomy and adenoidectomy    . Tubal ligation    . Uternine ablasion    . Wisdom tooth extraction     Social History  Substance Use Topics  . Smoking status: Never Smoker   . Smokeless tobacco: Not on file  . Alcohol Use: No   family history includes Cancer in her mother; Heart attack in her mother; Heart failure in her mother; Hypertension in her mother.  ROS as above Medications: Current Outpatient Prescriptions  Medication Sig Dispense Refill  . albuterol (PROVENTIL HFA;VENTOLIN HFA) 108 (90 BASE) MCG/ACT inhaler Inhale 1-2 puffs into the lungs every 6 (six) hours as needed for wheezing or shortness of breath. 1 Inhaler 0  . azithromycin (ZITHROMAX) 250 MG tablet 2 Ttabs PO on Day 1, then one a day x 4 days. 6 tablet 0  . clonazePAM (KLONOPIN) 1 MG tablet Take 1 tablet (1 mg total) by  mouth 2 (two) times daily as needed. Need appointment to receive more refills 60 tablet 0  . esomeprazole (NEXIUM) 40 MG capsule TAKE ONE CAPSULE BY MOUTH EVERY DAY AT NOON 30 capsule 6  . FLUoxetine (PROZAC) 20 MG tablet Take 1 tablet (20 mg total) by mouth daily. 30 tablet 5  . fluticasone (FLONASE) 50 MCG/ACT nasal spray Place 2 sprays into both nostrils daily. (Patient taking differently: Place 2 sprays into both nostrils as needed. ) 16 g 11  . gabapentin (NEURONTIN) 100 MG capsule 1 tab by mouth daily for one week then twice a day. 60 capsule 3  . loratadine-pseudoephedrine (CLARITIN-D 24-HOUR) 10-240 MG per 24 hr tablet Take 1 tablet by mouth daily.    . Multiple Vitamin (MULTIVITAMIN) tablet Take 1 tablet by mouth daily.       No current facility-administered medications for this visit.   Allergies  Allergen Reactions  . Amoxicillin Hives  . Augmentin [Amoxicillin-Pot Clavulanate]   . Buspar [Buspirone]     headache  . Codeine Nausea And Vomiting  . Hctz [Hydrochlorothiazide]     Hives  . Sulfa Antibiotics Hives  . Wellbutrin [Bupropion] Rash     Exam:  BP 118/70 mmHg  Pulse 90  Temp(Src) 98.2 F (36.8 C) (Oral)  Ht 5\' 3"  (1.6 m)  Wt 165 lb (74.844 kg)  BMI 29.24 kg/m2 Gen: WDWN pleasant female in no acute distress.  HEENT: EOMI,  MMM. Left TM appears slightly erythematous and with possible fluid present.  Lungs: Normal work of breathing. CTABL Heart: RRR no MRG Exts: Brisk capillary refill, warm and well perfused.  Psych: A&O x 3 with normal affect.   Assessment: 1. Anxiety and depression based on previous diagnoses and patient h/o symptoms.  2. Myringitis based on previous d/o sinusitis, patient description of symptoms and physical exam revealing TM irritation.20 mg prednisone 2 tablets QD for 5 days 3. Need for screening mammogram based on age.   Plan: 1. Patient instructed to f/u in 6 months or sooner if problems arise. 2. Prescribed Prednisone 20 mg BID x 5  days for symptomatic relief and reduction of inflammation. 3. Patient referral sent for mammogram today    Reviewed and changes made by [place] Planas PA-C

## 2015-08-12 DIAGNOSIS — H6980 Other specified disorders of Eustachian tube, unspecified ear: Secondary | ICD-10-CM | POA: Insufficient documentation

## 2015-09-08 ENCOUNTER — Encounter: Payer: Self-pay | Admitting: Sports Medicine

## 2015-09-08 ENCOUNTER — Ambulatory Visit (INDEPENDENT_AMBULATORY_CARE_PROVIDER_SITE_OTHER): Payer: Managed Care, Other (non HMO) | Admitting: Sports Medicine

## 2015-09-08 VITALS — BP 123/87 | HR 92 | Wt 166.0 lb

## 2015-09-08 DIAGNOSIS — G5603 Carpal tunnel syndrome, bilateral upper limbs: Secondary | ICD-10-CM | POA: Diagnosis not present

## 2015-09-08 NOTE — Progress Notes (Signed)
  Subjective:    CC: return to clinic for F/U of carpal tunnel syndrome  HPI: Patient reports significant improvement since getting the median nerve hydrodissection and starting gabapentin. Her only remaining symptom is 3rd finger numbness on her right hand. She denies pain, paraesthesias, or numbness elsewhere. The numbness is most pronounced in the mornings and she reports it as being "tolerable". She no longer drops anything and feels like she "got her life back". She would not like to pursue surgery for the remaining symptoms and would prefer to stay the course.  Past medical history, Surgical history, Family history not pertinant except as noted below, Social history, Allergies, and medications have been entered into the medical record, reviewed, and no changes needed.   Review of Systems: No fevers, chills, night sweats, weight loss, chest pain, or shortness of breath.   Objective:    General: Well Developed, well nourished, and in no acute distress.  Neuro: Alert and oriented x3, extra-ocular muscles intact, sensation grossly intact.  HEENT: Normocephalic, atraumatic, pupils equal round reactive to light, neck supple, no masses, no lymphadenopathy, thyroid nonpalpable.  Skin: Warm and dry, no rashes. Cardiac: Regular rate and rhythm, no murmurs rubs or gallops, no lower extremity edema.  Respiratory: Clear to auscultation bilaterally. Not using accessory muscles, speaking in full sentences. Wrist: Inspection normal with no visible erythema or swelling. ROM smooth and normal with good flexion and extension and ulnar/radial deviation that is symmetrical with opposite wrist. Palpation is normal over metacarpals, navicular, lunate, and TFCC; tendons without tenderness/ swelling No snuffbox tenderness. No tenderness over Canal of [place]. Strength 5/5 in all directions without pain. Negative Finkelstein, tinel's and phalens. Negative Watson's test. Numbness only on the palmar surface of  the 3rd distal phalange.   Impression and Recommendations:    Patient is doing significantly better. As she reports the residual symptoms as tolerable, she would prefer to stay the course with gabapentin instead of discussing surgery at this time. Patient to return PRN.

## 2015-09-08 NOTE — Assessment & Plan Note (Signed)
Doing well after bilateral median nerve hydrodissection 2 months ago. She had a bit of persistent right middle finger paresthesias so we added gabapentin, and overall she is happy with how things are going. Return as needed.

## 2015-09-18 ENCOUNTER — Other Ambulatory Visit: Payer: Self-pay | Admitting: Physician Assistant

## 2015-11-04 ENCOUNTER — Ambulatory Visit (INDEPENDENT_AMBULATORY_CARE_PROVIDER_SITE_OTHER): Payer: Managed Care, Other (non HMO) | Admitting: Sports Medicine

## 2015-11-04 VITALS — BP 122/78 | HR 90 | Resp 18 | Wt 172.6 lb

## 2015-11-04 DIAGNOSIS — M7061 Trochanteric bursitis, right hip: Secondary | ICD-10-CM | POA: Diagnosis not present

## 2015-11-04 DIAGNOSIS — M7062 Trochanteric bursitis, left hip: Secondary | ICD-10-CM

## 2015-11-04 NOTE — Assessment & Plan Note (Addendum)
Still with significant hip abductor weakness. Bilateral injections, aggressive home rehabilitation exercises.  Return to see me in 6 weeks.

## 2015-11-04 NOTE — Patient Instructions (Signed)
Hip Rehabilitation Protocol:  1.  Side leg raises.  3x30 with no weight, then 3x15 with 2 lb ankle weight, then 3x15 with 5 lb ankle weight 2.  Standing hip rotation.  3x30 with no weight, then 3x15 with 2 lb ankle weight, then 3x15 with 5 lb ankle weight. 3.  Side step ups.  3x30 with no weight, then 3x15 with 5 lbs in backpack, then 3x15 with 10 lbs in backpack. 

## 2015-11-04 NOTE — Progress Notes (Signed)
   Subjective:    I'm seeing this patient as a consultation for:  Iran Planas, PA-C  CC: Bilateral hip pain  HPI: This is a pleasant 50 year old female, for sometime she's had pain that she localizes in the lateral aspect of both hips, moderate, persistent without radiation. She went through physical therapy and home exercises with only minimal improvement, eventually she had an MRI that confirmed bilateral trochanteric bursitis. She is here today for further evaluation and definitive treatment.  Past medical history, Surgical history, Family history not pertinant except as noted below, Social history, Allergies, and medications have been entered into the medical record, reviewed, and no changes needed.   Review of Systems: No headache, visual changes, nausea, vomiting, diarrhea, constipation, dizziness, abdominal pain, skin rash, fevers, chills, night sweats, weight loss, swollen lymph nodes, body aches, joint swelling, muscle aches, chest pain, shortness of breath, mood changes, visual or auditory hallucinations.   Objective:   General: Well Developed, well nourished, and in no acute distress.  Neuro/Psych: Alert and oriented x3, extra-ocular muscles intact, able to move all 4 extremities, sensation grossly intact. Skin: Warm and dry, no rashes noted.  Respiratory: Not using accessory muscles, speaking in full sentences, trachea midline.  Cardiovascular: Pulses palpable, no extremity edema. Abdomen: Does not appear distended. Bilateral Hip: ROM IR: 60 Deg, ER: 60 Deg, Flexion: 120 Deg, Extension: 100 Deg, Abduction: 45 Deg, Adduction: 45 Deg Strength IR: 5/5, ER: 5/5, Flexion: 5/5, Extension: 5/5, Abduction: 4-/5 and weak bilaterally, Adduction: 5/5 Pelvic alignment unremarkable to inspection and palpation. Standing hip rotation and gait without trendelenburg / unsteadiness. Greater trochanter with tenderness bilaterally No tenderness over piriformis. No SI joint tenderness and  normal minimal SI movement.  Procedure:  Injection of right greater trochanteric bursa Consent obtained and verified. Time-out conducted. Noted no overlying erythema, induration, or other signs of local infection. Skin prepped in a sterile fashion. Topical analgesic spray: Ethyl chloride. Completed without difficulty. Meds: Spinal needle advanced to the greater trochanter, and a total of 1 mL kenalog 40, 2 mL lidocaine, 2 mL Marcaine injected easily in a fanlike pattern. Pain immediately improved suggesting accurate placement of the medication. Advised to call if fevers/chills, erythema, induration, drainage, or persistent bleeding.  Procedure:  Injection of left greater trochanteric bursa Consent obtained and verified. Time-out conducted. Noted no overlying erythema, induration, or other signs of local infection. Skin prepped in a sterile fashion. Topical analgesic spray: Ethyl chloride. Completed without difficulty. Meds: Spinal needle advanced to the greater trochanter, and a total of 1 mL kenalog 40, 2 mL lidocaine, 2 mL Marcaine injected easily in a fanlike pattern. Pain immediately improved suggesting accurate placement of the medication. Advised to call if fevers/chills, erythema, induration, drainage, or persistent bleeding.  Impression and Recommendations:   This case required medical decision making of moderate complexity.

## 2015-11-18 ENCOUNTER — Ambulatory Visit (INDEPENDENT_AMBULATORY_CARE_PROVIDER_SITE_OTHER): Payer: Managed Care, Other (non HMO) | Admitting: Physician Assistant

## 2015-11-18 ENCOUNTER — Encounter: Payer: Self-pay | Admitting: Physician Assistant

## 2015-11-18 VITALS — BP 149/67 | HR 85 | Ht 63.0 in | Wt 171.0 lb

## 2015-11-18 DIAGNOSIS — E669 Obesity, unspecified: Secondary | ICD-10-CM | POA: Diagnosis not present

## 2015-11-18 DIAGNOSIS — R635 Abnormal weight gain: Secondary | ICD-10-CM

## 2015-11-18 MED ORDER — PHENTERMINE HCL 37.5 MG PO TABS: 37.5000 mg | ORAL_TABLET | Freq: Every day | ORAL | Status: DC

## 2015-11-18 NOTE — Progress Notes (Addendum)
   Subjective:    Patient ID: Shirley Vasquez, female    DOB: [birth year], 50 y.o.   MRN: [id number]  HPI  Patient is a 50 year old female presenting to discuss weight loss options. Patient has a history of bilateral trochanteric bursitis. Patient states that she is slowly returning to the gym for exercise. Patient states that she has been working to improve her diet. Patient's mother has a past medical history of coronary artery disease and cancer. Patient is very concerned about improving her lifestyle to live her life "as healthy as possible." She states that she does not want to experience the same health problems as her mom. Patient is concerned about taking a weight loss medication that will exacerbate her anxiety. Patient denies recent illness, fatigue, or feelings of helplessness or hopelessness.   Review of Systems    Please see HPI Objective:   Physical Exam  Constitutional: She is oriented to person, place, and time. She appears well-developed and well-nourished.  HENT:  Head: Normocephalic and atraumatic.   Patient's left TM has a tympanostomy tube in place.   Eyes: Conjunctivae are normal. Pupils are equal, round, and reactive to light.  Neck: Normal range of motion. No thyromegaly present.  Cardiovascular: Normal rate, regular rhythm, normal heart sounds and intact distal pulses.   Pulmonary/Chest: Effort normal and breath sounds normal. No respiratory distress. She has no wheezes.  Abdominal: Soft. Bowel sounds are normal. She exhibits no distension. There is no tenderness. There is no rebound and no guarding.  Musculoskeletal: Normal range of motion.  Lymphadenopathy:    She has no cervical adenopathy.  Neurological: She is alert and oriented to person, place, and time. No cranial nerve deficit.  Skin: Skin is warm and dry.  Psychiatric: She has a normal mood and affect. Her behavior is normal. Judgment and thought content normal.          Assessment & Plan:   1.  Obesity/abnormal weight gain Patient education was provided about incorporating varied exercise into patient's daily life, as repetitive exercise can exacerbate trochanteric bursitis. Patient education was also provided about maintaining a healthy diet. Discussed different weight loss medications with patient. Patient is particularly interested in [place] but concerned insurance will not pay for it. Phentermine started. Discussed side effects. If worsens anxiety then stop medication. Start with 1/2 tablets. Follow up in 1 month for weight check.

## 2015-12-11 ENCOUNTER — Encounter: Payer: Self-pay | Admitting: Family Medicine

## 2015-12-11 ENCOUNTER — Ambulatory Visit (INDEPENDENT_AMBULATORY_CARE_PROVIDER_SITE_OTHER): Payer: Managed Care, Other (non HMO) | Admitting: Family Medicine

## 2015-12-11 VITALS — BP 118/78 | HR 80 | Temp 98.2°F | Wt 169.0 lb

## 2015-12-11 DIAGNOSIS — J4521 Mild intermittent asthma with (acute) exacerbation: Secondary | ICD-10-CM | POA: Diagnosis not present

## 2015-12-11 MED ORDER — DOXYCYCLINE HYCLATE 100 MG PO TABS: ORAL_TABLET | ORAL | Status: DC

## 2015-12-11 MED ORDER — METHYLPREDNISOLONE ACETATE 80 MG/ML IJ SUSP
80.0000 mg | Freq: Once | INTRAMUSCULAR | Status: AC
  Administered 2015-12-11: 80 mg via INTRAMUSCULAR

## 2015-12-11 NOTE — Addendum Note (Signed)
Addended by: Delrae Alfred on: 12/11/2015 11:46 AM   Modules accepted: Orders

## 2015-12-11 NOTE — Progress Notes (Signed)
CC: Shirley Vasquez is a 50 y.o. female is here for URI   Subjective: HPI:  One half weeks of nonproductive cough, facial pressure, nasal congestion and chest tightness. She denies any exertional component to her chest tightness. Slightly improved with using albuterol. She was getting better over the weekend however symptoms rebounded to become moderate in severity on a daily basis only present during the day and not interfering with sleep. She denies fevers, chills, or chest pain. She denies wheezing or abdominal pain   Review Of Systems Outlined In HPI  Past Medical History  Diagnosis Date  . Anxiety   . Fibroid     Uterine ablation  . GERD (gastroesophageal reflux disease)   . Asthma     Past Surgical History  Procedure Laterality Date  . Cholecystectomy    . Bladder suspension    . Tonsillectomy and adenoidectomy    . Tubal ligation    . Uternine ablasion    . Wisdom tooth extraction     Family History  Problem Relation Age of Onset  . Cancer Mother     breast  . Heart failure Mother   . Hypertension Mother   . Heart attack Mother     Social History   Social History  . Marital Status: Married    Spouse Name: N/A  . Number of Children: N/A  . Years of Education: N/A   Occupational History  . Not on file.   Social History Main Topics  . Smoking status: Never Smoker   . Smokeless tobacco: Not on file  . Alcohol Use: No  . Drug Use: No  . Sexual Activity: Not on file   Other Topics Concern  . Not on file   Social History Narrative     Objective: BP 118/78 mmHg  Pulse 80  Temp(Src) 98.2 F (36.8 C) (Oral)  Wt 169 lb (76.658 kg)  SpO2 94%  General: Alert and Oriented, No Acute Distress HEENT: Pupils equal, round, reactive to light. Conjunctivae clear.  External ears unremarkable, canals clear with intact TMs with appropriate landmarks.  Middle ear appears open without effusion. Pink inferior turbinates.  Moist mucous membranes, pharynx without  inflammation nor lesions.  Neck supple without palpable lymphadenopathy nor abnormal masses. Lungs: Clear to auscultation bilaterally, no wheezing/ronchi/rales.  Comfortable work of breathing. Good air movement. Cardiac: Regular rate and rhythm. Normal S1/S2.  No murmurs, rubs, nor gallops.   Extremities: No peripheral edema.  Strong peripheral pulses.  Mental Status: No depression, anxiety, nor agitation. Skin: Warm and dry.  Assessment & Plan: Birdella was seen today for uri.  Diagnoses and all orders for this visit:  Asthma, mild intermittent, with acute exacerbation -     doxycycline (VIBRA-TABS) 100 MG tablet; One by mouth twice a day for ten days.   Start doxycycline consider nasal saline washes continue albuterol on an as-needed basis and provided with Depo-Medrol today. Signs and symptoms requring emergent/urgent reevaluation were discussed with the patient.  Return if symptoms worsen or fail to improve.

## 2015-12-16 ENCOUNTER — Ambulatory Visit: Payer: Managed Care, Other (non HMO) | Admitting: Sports Medicine

## 2015-12-18 ENCOUNTER — Ambulatory Visit: Payer: Managed Care, Other (non HMO) | Admitting: Sports Medicine

## 2015-12-19 ENCOUNTER — Encounter: Payer: Self-pay | Admitting: *Deleted

## 2015-12-19 ENCOUNTER — Emergency Department (INDEPENDENT_AMBULATORY_CARE_PROVIDER_SITE_OTHER)
Admission: EM | Admit: 2015-12-19 | Discharge: 2015-12-19 | Disposition: A | Discharge status: Home / Self Care | Payer: Managed Care, Other (non HMO) | Source: Home / Self Care | Attending: Family Medicine | Admitting: Family Medicine

## 2015-12-19 DIAGNOSIS — J9801 Acute bronchospasm: Secondary | ICD-10-CM

## 2015-12-19 MED ORDER — FLUTICASONE-SALMETEROL 100-50 MCG/DOSE IN AEPB: 1.0000 | INHALATION_SPRAY | Freq: Two times a day (BID) | RESPIRATORY_TRACT | Status: DC

## 2015-12-19 MED ORDER — PREDNISONE 20 MG PO TABS: ORAL_TABLET | ORAL | Status: DC

## 2015-12-19 NOTE — Discharge Instructions (Signed)
Continue albuterol inhaler as needed. May take Delsym Cough Suppressant at bedtime for nighttime cough.

## 2015-12-19 NOTE — ED Provider Notes (Signed)
CSN: [id number]     Arrival date & time 12/19/15  1834 History   First MD Initiated Contact with Patient 12/19/15 1918     Chief Complaint  Patient presents with  . Cough  . Chest Pain      HPI Comments: Patient reports that she developed mild URI symptoms about 3 weeks ago.  She became worse and 2 weeks ago she was treated with a course of doxycycline and steroid injection.  She has felt well, but two days ago while out of town she slept on feather pillows, subsequently developing increased cough, chest tightness, shortness of breath, and headache.  Her chest symptoms improved somewhat when using her inhaler.  No fevers, chills, and sweats   The history is provided by the patient.    Past Medical History  Diagnosis Date  . Anxiety   . Fibroid     Uterine ablation  . GERD (gastroesophageal reflux disease)   . Asthma    Past Surgical History  Procedure Laterality Date  . Cholecystectomy    . Bladder suspension    . Tonsillectomy and adenoidectomy    . Tubal ligation    . Uternine ablasion    . Wisdom tooth extraction     Family History  Problem Relation Age of Onset  . Cancer Mother     breast  . Heart failure Mother   . Hypertension Mother   . Heart attack Mother    Social History  Substance Use Topics  . Smoking status: Never Smoker   . Smokeless tobacco: None  . Alcohol Use: No   OB History    No data available     Review of Systems No sore throat + hoarse + cough No pleuritic pain + wheezing + nasal congestion + post-nasal drainage No sinus pain/pressure No itchy/red eyes No earache No hemoptysis + SOB No fever/chills No nausea No vomiting No abdominal pain No diarrhea No urinary symptoms No skin rash No fatigue No myalgias + headache Used OTC meds without relief  Allergies  Amoxicillin; Augmentin; Buspar; Codeine; Hctz; Sulfa antibiotics; and Wellbutrin  Home Medications   Prior to Admission medications   Medication Sig Start Date End  Date Taking? Authorizing Provider  albuterol (PROVENTIL HFA;VENTOLIN HFA) 108 (90 BASE) MCG/ACT inhaler Inhale 1-2 puffs into the lungs every 6 (six) hours as needed for wheezing or shortness of breath. 06/29/15   Noland Fordyce, PA-C  clonazePAM (KLONOPIN) 1 MG tablet Take 1 tablet (1 mg total) by mouth 2 (two) times daily as needed. 07/28/15   Jade L Breeback, PA-C  esomeprazole (NEXIUM) 40 MG capsule TAKE ONE CAPSULE BY MOUTH EVERY DAY AT NOON Patient taking differently: Take 40 mg by mouth daily as needed. TAKE ONE CAPSULE BY MOUTH EVERY DAY AT NOON 07/28/15   Jade L Breeback, PA-C  FLUoxetine (PROZAC) 20 MG tablet Take 1 tablet (20 mg total) by mouth daily. 07/28/15   Jade L Breeback, PA-C  fluticasone (FLONASE) 50 MCG/ACT nasal spray Place 2 sprays into both nostrils daily. Patient taking differently: Place 2 sprays into both nostrils as needed.  08/20/14   Jade L Breeback, PA-C  Fluticasone-Salmeterol (ADVAIR DISKUS) 100-50 MCG/DOSE AEPB Inhale 1 puff into the lungs 2 (two) times daily. 12/19/15   Kandra Nicolas, MD  loratadine-pseudoephedrine (CLARITIN-D 24-HOUR) 10-240 MG per 24 hr tablet Take 1 tablet by mouth daily.    Historical Provider, MD  Multiple Vitamin (MULTIVITAMIN) tablet Take 1 tablet by mouth daily.  Historical Provider, MD  predniSONE (DELTASONE) 20 MG tablet Take one tab by mouth twice daily for 5 days, then one daily. Take with food. 12/19/15   Kandra Nicolas, MD   Meds Ordered and Administered this Visit  Medications - No data to display  BP 146/87 mmHg  Pulse 87  Temp(Src) 98.5 F (36.9 C) (Oral)  Resp 18  SpO2 98% No data found.   Physical Exam Nursing notes and Vital Signs reviewed. Appearance:  Patient appears stated age, and in no acute distress Eyes:  Pupils are equal, round, and reactive to light and accomodation.  Extraocular movement is intact.  Conjunctivae are not inflamed  Ears:  Canals normal.  Tympanic membranes scarred.  Left tympanic membrane  has ventilation tube in place.  Nose:  Mildly congested turbinates.  No sinus tenderness.   Pharynx:  Normal Neck:  Supple.   No adenopathy Lungs:  Clear to auscultation.  Breath sounds are equal.  Moving air well. Heart:  Regular rate and rhythm without murmurs, rubs, or gallops.  Abdomen:  Nontender without masses or hepatosplenomegaly.  Bowel sounds are present.  No CVA or flank tenderness.  Extremities:  No edema.  Skin:  No rash present.   ED Course  Procedures none    MDM   1. Bronchospasm; suspect post-infectious cough with allergic component    There is no evidence of bacterial infection today.    Continue albuterol inhaler as needed. Rx for Advair May take Delsym Cough Suppressant at bedtime for nighttime cough.    Kandra Nicolas, MD 12/23/15 [phone]

## 2015-12-19 NOTE — ED Notes (Signed)
Pt finished 5 days of Doxy 4 days ago for URI. She has since gone out of town and slept on feather pillows which she believes flared up her asthma. She c/o cough, chest tightness and HA. Used her inhaler for the past 3 days. Afebrile.

## 2015-12-22 ENCOUNTER — Emergency Department (HOSPITAL_BASED_OUTPATIENT_CLINIC_OR_DEPARTMENT_OTHER): Payer: Managed Care, Other (non HMO)

## 2015-12-22 ENCOUNTER — Encounter (HOSPITAL_BASED_OUTPATIENT_CLINIC_OR_DEPARTMENT_OTHER): Payer: Self-pay | Admitting: *Deleted

## 2015-12-22 ENCOUNTER — Emergency Department (HOSPITAL_BASED_OUTPATIENT_CLINIC_OR_DEPARTMENT_OTHER)
Admission: EM | Admit: 2015-12-22 | Discharge: 2015-12-22 | Disposition: A | Discharge status: Home / Self Care | Payer: Managed Care, Other (non HMO) | Attending: Emergency Medicine | Admitting: Emergency Medicine

## 2015-12-22 DIAGNOSIS — S0990XA Unspecified injury of head, initial encounter: Secondary | ICD-10-CM | POA: Diagnosis present

## 2015-12-22 DIAGNOSIS — Y9389 Activity, other specified: Secondary | ICD-10-CM | POA: Insufficient documentation

## 2015-12-22 DIAGNOSIS — W01198A Fall on same level from slipping, tripping and stumbling with subsequent striking against other object, initial encounter: Secondary | ICD-10-CM | POA: Diagnosis not present

## 2015-12-22 DIAGNOSIS — S161XXA Strain of muscle, fascia and tendon at neck level, initial encounter: Secondary | ICD-10-CM | POA: Insufficient documentation

## 2015-12-22 DIAGNOSIS — J45909 Unspecified asthma, uncomplicated: Secondary | ICD-10-CM | POA: Insufficient documentation

## 2015-12-22 DIAGNOSIS — F419 Anxiety disorder, unspecified: Secondary | ICD-10-CM | POA: Diagnosis not present

## 2015-12-22 DIAGNOSIS — Z7951 Long term (current) use of inhaled steroids: Secondary | ICD-10-CM | POA: Diagnosis not present

## 2015-12-22 DIAGNOSIS — Z7952 Long term (current) use of systemic steroids: Secondary | ICD-10-CM | POA: Insufficient documentation

## 2015-12-22 DIAGNOSIS — Z79899 Other long term (current) drug therapy: Secondary | ICD-10-CM | POA: Diagnosis not present

## 2015-12-22 DIAGNOSIS — Y9289 Other specified places as the place of occurrence of the external cause: Secondary | ICD-10-CM | POA: Diagnosis not present

## 2015-12-22 DIAGNOSIS — K219 Gastro-esophageal reflux disease without esophagitis: Secondary | ICD-10-CM | POA: Diagnosis not present

## 2015-12-22 DIAGNOSIS — S0003XA Contusion of scalp, initial encounter: Secondary | ICD-10-CM | POA: Diagnosis not present

## 2015-12-22 DIAGNOSIS — Y998 Other external cause status: Secondary | ICD-10-CM | POA: Insufficient documentation

## 2015-12-22 DIAGNOSIS — Z880 Allergy status to penicillin: Secondary | ICD-10-CM | POA: Insufficient documentation

## 2015-12-22 DIAGNOSIS — S29002A Unspecified injury of muscle and tendon of back wall of thorax, initial encounter: Secondary | ICD-10-CM | POA: Diagnosis not present

## 2015-12-22 DIAGNOSIS — Z86018 Personal history of other benign neoplasm: Secondary | ICD-10-CM | POA: Diagnosis not present

## 2015-12-22 MED ORDER — METHOCARBAMOL 500 MG PO TABS: 1000.0000 mg | ORAL_TABLET | Freq: Three times a day (TID) | ORAL | Status: DC | PRN

## 2015-12-22 MED ORDER — IBUPROFEN 400 MG PO TABS
600.0000 mg | ORAL_TABLET | Freq: Once | ORAL | Status: AC
  Administered 2015-12-22: 600 mg via ORAL
  Filled 2015-12-22: qty 1

## 2015-12-22 MED ORDER — METHOCARBAMOL 500 MG PO TABS
1000.0000 mg | ORAL_TABLET | Freq: Once | ORAL | Status: AC
  Administered 2015-12-22: 1000 mg via ORAL
  Filled 2015-12-22: qty 2

## 2015-12-22 MED ORDER — IBUPROFEN 600 MG PO TABS: 600.0000 mg | ORAL_TABLET | Freq: Four times a day (QID) | ORAL | Status: DC | PRN

## 2015-12-22 MED FILL — IBUPROFEN 600 MG TABLET: 600 | 7 days supply | Qty: 30 | Fill #0

## 2015-12-22 MED FILL — METHOCARBAMOL 500 MG TABLET: 500 | 5 days supply | Qty: 30 | Fill #0

## 2015-12-22 NOTE — ED Provider Notes (Signed)
CSN: [id number]     Arrival date & time 12/22/15  1237 History   First MD Initiated Contact with Patient 12/22/15 1254     Chief Complaint  Patient presents with  . Fall     (Consider location/radiation/quality/duration/timing/severity/associated sxs/prior Treatment) HPI Patient presents after slip and fall on water landing on her back and striking her head on the floor. No loss of consciousness. She complains of left-sided neck pain and posterior headache. No visual changes, nausea or vomiting. No focal weakness or numbness. EMS called and patient was placed in a cervical collar. Past Medical History  Diagnosis Date  . Anxiety   . Fibroid     Uterine ablation  . GERD (gastroesophageal reflux disease)   . Asthma    Past Surgical History  Procedure Laterality Date  . Cholecystectomy    . Bladder suspension    . Tonsillectomy and adenoidectomy    . Tubal ligation    . Uternine ablasion    . Wisdom tooth extraction     Family History  Problem Relation Age of Onset  . Cancer Mother     breast  . Heart failure Mother   . Hypertension Mother   . Heart attack Mother    Social History  Substance Use Topics  . Smoking status: Never Smoker   . Smokeless tobacco: None  . Alcohol Use: No   OB History    No data available     Review of Systems  Constitutional: Negative for fever and chills.  Eyes: Negative for visual disturbance.  Respiratory: Negative for shortness of breath.   Cardiovascular: Negative for chest pain.  Gastrointestinal: Negative for nausea, vomiting and abdominal pain.  Musculoskeletal: Positive for myalgias, back pain and neck pain.  Skin: Negative for rash and wound.  Neurological: Positive for headaches. Negative for dizziness, syncope, weakness and numbness.  All other systems reviewed and are negative.     Allergies  Amoxicillin; Augmentin; Buspar; Codeine; Hctz; Morphine and related; Sulfa antibiotics; and Wellbutrin  Home Medications    Prior to Admission medications   Medication Sig Start Date End Date Taking? Authorizing Provider  albuterol (PROVENTIL HFA;VENTOLIN HFA) 108 (90 BASE) MCG/ACT inhaler Inhale 1-2 puffs into the lungs every 6 (six) hours as needed for wheezing or shortness of breath. 06/29/15   Noland Fordyce, PA-C  clonazePAM (KLONOPIN) 1 MG tablet Take 1 tablet (1 mg total) by mouth 2 (two) times daily as needed. 07/28/15   Jade L Breeback, PA-C  esomeprazole (NEXIUM) 40 MG capsule TAKE ONE CAPSULE BY MOUTH EVERY DAY AT NOON Patient taking differently: Take 40 mg by mouth daily as needed. TAKE ONE CAPSULE BY MOUTH EVERY DAY AT NOON 07/28/15   Jade L Breeback, PA-C  FLUoxetine (PROZAC) 20 MG tablet Take 1 tablet (20 mg total) by mouth daily. 07/28/15   Jade L Breeback, PA-C  fluticasone (FLONASE) 50 MCG/ACT nasal spray Place 2 sprays into both nostrils daily. Patient taking differently: Place 2 sprays into both nostrils as needed.  08/20/14   Jade L Breeback, PA-C  Fluticasone-Salmeterol (ADVAIR DISKUS) 100-50 MCG/DOSE AEPB Inhale 1 puff into the lungs 2 (two) times daily. 12/19/15   Kandra Nicolas, MD  ibuprofen (ADVIL,MOTRIN) 600 MG tablet Take 1 tablet (600 mg total) by mouth every 6 (six) hours as needed. 12/22/15   Julianne Rice, MD  loratadine-pseudoephedrine (CLARITIN-D 24-HOUR) 10-240 MG per 24 hr tablet Take 1 tablet by mouth daily.    Historical Provider, MD  methocarbamol (ROBAXIN) 500 MG  tablet Take 2 tablets (1,000 mg total) by mouth every 8 (eight) hours as needed for muscle spasms. 12/22/15   Julianne Rice, MD  Multiple Vitamin (MULTIVITAMIN) tablet Take 1 tablet by mouth daily.      Historical Provider, MD  predniSONE (DELTASONE) 20 MG tablet Take one tab by mouth twice daily for 5 days, then one daily. Take with food. 12/19/15   Kandra Nicolas, MD   BP 122/80 mmHg  Pulse 71  Temp(Src) 98.2 F (36.8 C) (Oral)  Resp 18  Ht 5\' 3"  (1.6 m)  Wt 168 lb (76.204 kg)  BMI 29.77 kg/m2  SpO2  99% Physical Exam  Constitutional: She is oriented to person, place, and time. She appears well-developed and well-nourished. No distress.  HENT:  Head: Normocephalic.  Mouth/Throat: Oropharynx is clear and moist.  Patient has mild tenderness to palpation to the occiput. There is no swelling or deformity. No raccoon's sign or Battle sign  Eyes: EOM are normal. Pupils are equal, round, and reactive to light.  Neck: Normal range of motion. Neck supple.  Patient has mild mid point C7 tenderness. She also complains of tenderness to palpation over the right paraspinal cervical muscles.  Cardiovascular: Normal rate and regular rhythm.  Exam reveals no gallop and no friction rub.   No murmur heard. Pulmonary/Chest: Effort normal and breath sounds normal. No respiratory distress. She has no wheezes. She has no rales.  Abdominal: Soft. Bowel sounds are normal. She exhibits no distension and no mass. There is no tenderness. There is no rebound and no guarding.  Musculoskeletal: Normal range of motion. She exhibits tenderness. She exhibits no edema.  Low thoracic mid line tenderness. No obvious injury. No deformity.  Neurological: She is alert and oriented to person, place, and time.  5/5 motor in all extremities. Sensation is fully intact.  Skin: Skin is warm and dry. No rash noted. No erythema.  Psychiatric: She has a normal mood and affect. Her behavior is normal.  Nursing note and vitals reviewed.   ED Course  Procedures (including critical care time) Labs Review Labs Reviewed - No data to display  Imaging Review Ct Head Wo Contrast  12/22/2015  CLINICAL DATA:  Left head, neck and back pain after falling today. No loss of consciousness. EXAM: CT HEAD WITHOUT CONTRAST CT CERVICAL SPINE WITHOUT CONTRAST TECHNIQUE: Multidetector CT imaging of the head and cervical spine was performed following the standard protocol without intravenous contrast. Multiplanar CT image reconstructions of the cervical  spine were also generated. COMPARISON:  None. FINDINGS: CT HEAD FINDINGS Brain: There is no evidence of acute intracranial hemorrhage, mass lesion, brain edema or extra-axial fluid collection. The ventricles and subarachnoid spaces are appropriately sized for age. There is no CT evidence of acute cortical infarction. Bones/sinuses/visualized face: The visualized paranasal sinuses, mastoid air cells and middle ears are clear. The calvarium is intact. CT CERVICAL SPINE FINDINGS The alignment is normal. There is no evidence of acute fracture or traumatic subluxation. There is mild uncinate spurring, greatest at C5-6 and C6-7. No acute soft tissue findings are seen. IMPRESSION: 1. Negative noncontrast head CT. 2. No acute cervical spine findings. No evidence of cervical spine fracture, traumatic subluxation or static signs of instability. Mild spondylosis. Electronically Signed   By: Richardean Sale M.D.   On: 12/22/2015 13:45   Ct Cervical Spine Wo Contrast  12/22/2015  CLINICAL DATA:  Left head, neck and back pain after falling today. No loss of consciousness. EXAM: CT HEAD WITHOUT CONTRAST  CT CERVICAL SPINE WITHOUT CONTRAST TECHNIQUE: Multidetector CT imaging of the head and cervical spine was performed following the standard protocol without intravenous contrast. Multiplanar CT image reconstructions of the cervical spine were also generated. COMPARISON:  None. FINDINGS: CT HEAD FINDINGS Brain: There is no evidence of acute intracranial hemorrhage, mass lesion, brain edema or extra-axial fluid collection. The ventricles and subarachnoid spaces are appropriately sized for age. There is no CT evidence of acute cortical infarction. Bones/sinuses/visualized face: The visualized paranasal sinuses, mastoid air cells and middle ears are clear. The calvarium is intact. CT CERVICAL SPINE FINDINGS The alignment is normal. There is no evidence of acute fracture or traumatic subluxation. There is mild uncinate spurring,  greatest at C5-6 and C6-7. No acute soft tissue findings are seen. IMPRESSION: 1. Negative noncontrast head CT. 2. No acute cervical spine findings. No evidence of cervical spine fracture, traumatic subluxation or static signs of instability. Mild spondylosis. Electronically Signed   By: Richardean Sale M.D.   On: 12/22/2015 13:45   I have personally reviewed and evaluated these images and lab results as part of my medical decision-making.   EKG Interpretation None      MDM   Final diagnoses:  Scalp contusion, initial encounter  Cervical strain, acute, initial encounter    CT cervical spine without any acute injury. We'll treat with ibuprofen and Robaxin. Return precautions, head injury precautions given.    Julianne Rice, MD 12/22/15 1520

## 2015-12-22 NOTE — ED Notes (Signed)
Pt to room 8 with ems via stretcher. Pt reports slip and fall just pta, pt states she took a clonazepam one hour prior to her fall. Denies any loc, reports pain to her left head, neck and mid back area. Pt has c collar in place.

## 2015-12-22 NOTE — Discharge Instructions (Signed)
Facial or Scalp Contusion  A facial or scalp contusion is a deep bruise on the face or head. Contusions happen when an injury causes bleeding under the skin. Signs of bruising include pain, puffiness (swelling), and discolored skin. The contusion may turn blue, purple, or yellow. HOME CARE  Only take medicines as told by your doctor.  Put ice on the injured area.  Put ice in a plastic bag.  Place a towel between your skin and the bag.  Leave the ice on for 20 minutes, 2-3 times a day. GET HELP IF:  You have bite problems.  You have pain when chewing.  You are worried about your face not healing normally. GET HELP RIGHT AWAY IF:   You have severe pain or a headache and medicine does not help.  You are very tired or confused, or your personality changes.  You throw up (vomit).  You have a nosebleed that will not stop.  You see two of everything (double vision) or have blurry vision.  You have fluid coming from your nose or ear.  You have problems walking or using your arms or legs. MAKE SURE YOU:   Understand these instructions.  Will watch your condition.  Will get help right away if you are not doing well or get worse.   This information is not intended to replace advice given to you by your health care provider. Make sure you discuss any questions you have with your health care provider.   Document Released: 09/09/2011 Document Revised: 10/11/2014 Document Reviewed: 05/03/2013 Elsevier Interactive Patient Education 2016 [place].  Muscle Strain A muscle strain is an injury that occurs when a muscle is stretched beyond its normal length. Usually a small number of muscle fibers are torn when this happens. Muscle strain is rated in degrees. First-degree strains have the least amount of muscle fiber tearing and pain. Second-degree and third-degree strains have increasingly more tearing and pain.  Usually, recovery from muscle strain takes 1-2 weeks. Complete  healing takes 5-6 weeks.  CAUSES  Muscle strain happens when a sudden, violent force placed on a muscle stretches it too far. This may occur with lifting, sports, or a fall.  RISK FACTORS Muscle strain is especially common in athletes.  SIGNS AND SYMPTOMS At the site of the muscle strain, there may be:  Pain.  Bruising.  Swelling.  Difficulty using the muscle due to pain or lack of normal function. DIAGNOSIS  Your health care provider will perform a physical exam and ask about your medical history. TREATMENT  Often, the best treatment for a muscle strain is resting, icing, and applying cold compresses to the injured area.  HOME CARE INSTRUCTIONS   Use the PRICE method of treatment to promote muscle healing during the first 2-3 days after your injury. The PRICE method involves:  Protecting the muscle from being injured again.  Restricting your activity and resting the injured body part.  Icing your injury. To do this, put ice in a plastic bag. Place a towel between your skin and the bag. Then, apply the ice and leave it on from 15-20 minutes each hour. After the third day, switch to moist heat packs.  Apply compression to the injured area with a splint or elastic bandage. Be careful not to wrap it too tightly. This may interfere with blood circulation or increase swelling.  Elevate the injured body part above the level of your heart as often as you can.  Only take over-the-counter or prescription medicines  for pain, discomfort, or fever as directed by your health care provider.  Warming up prior to exercise helps to prevent future muscle strains. SEEK MEDICAL CARE IF:   You have increasing pain or swelling in the injured area.  You have numbness, tingling, or a significant loss of strength in the injured area. MAKE SURE YOU:   Understand these instructions.  Will watch your condition.  Will get help right away if you are not doing well or get worse.   This information  is not intended to replace advice given to you by your health care provider. Make sure you discuss any questions you have with your health care provider.   Document Released: 09/20/2005 Document Revised: 07/11/2013 Document Reviewed: 04/19/2013 Elsevier Interactive Patient Education [employer].

## 2015-12-25 ENCOUNTER — Ambulatory Visit (INDEPENDENT_AMBULATORY_CARE_PROVIDER_SITE_OTHER): Payer: Managed Care, Other (non HMO) | Admitting: Sports Medicine

## 2015-12-25 ENCOUNTER — Ambulatory Visit: Payer: Managed Care, Other (non HMO) | Admitting: Sports Medicine

## 2015-12-25 ENCOUNTER — Encounter: Payer: Self-pay | Admitting: Sports Medicine

## 2015-12-25 VITALS — BP 133/85 | HR 102 | Wt 171.7 lb

## 2015-12-25 DIAGNOSIS — M5412 Radiculopathy, cervical region: Secondary | ICD-10-CM

## 2015-12-25 MED ORDER — PREDNISONE 50 MG PO TABS: ORAL_TABLET | ORAL | Status: DC

## 2015-12-25 NOTE — Progress Notes (Signed)
   Subjective:    I'm seeing this patient as a consultation for:  Dr. Julianne Rice, [place], PA-C  CC: neck and shoulder pain  HPI: One day this pleasant 15 old female slipped and fell in a puddle of water at [employer]. She had immediate pain, in the back of her neck with radiation around the right shoulder blade, pain is moderate, persistent. She was seen in the emergency department where a CT of the head and cervical spine were negative for any evidence of acute fracture. She was given Robaxin and ibuprofen appropriately. She is here for further evaluation and definitive treatment regarding her pain symptoms. No radicular pain down the arms no lower extremity symptoms, no bowel or bladder dysfunction, no constitutional symptoms.  Past medical history, Surgical history, Family history not pertinant except as noted below, Social history, Allergies, and medications have been entered into the medical record, reviewed, and no changes needed.   Review of Systems: No headache, visual changes, nausea, vomiting, diarrhea, constipation, dizziness, abdominal pain, skin rash, fevers, chills, night sweats, weight loss, swollen lymph nodes, body aches, joint swelling, muscle aches, chest pain, shortness of breath, mood changes, visual or auditory hallucinations.   Objective:   General: Well Developed, well nourished, and in no acute distress.  Neuro/Psych: Alert and oriented x3, extra-ocular muscles intact, able to move all 4 extremities, sensation grossly intact. Skin: Warm and dry, no rashes noted.  Respiratory: Not using accessory muscles, speaking in full sentences, trachea midline.  Cardiovascular: Pulses palpable, no extremity edema. Abdomen: Does not appear distended. Neck: Negative spurling's Full neck range of motion Grip strength and sensation normal in bilateral hands Strength good C4 to T1 distribution No sensory change to C4 to T1 Reflexes normal Discrete area of  muscle spasm around the rhomboid major immediately periscapular.  CT reviewed personally and shows C5-C6 degenerative changes.  Impression and Recommendations:   This case required medical decision making of moderate complexity.

## 2015-12-25 NOTE — Assessment & Plan Note (Signed)
After a fall on Monday at [employer]. She slipped in water and thus they will be paying for her medical bills. Prednisone, adding formal physical therapy, she does have C5-C6 degenerative disc disease on her CT scan, she sustained a cervical strain from the fall and this likely exacerbated existing cervical degenerative disc disease to the point where she is expressing radiculopathy. If insufficient response by 4 weeks we will proceed with cervical epidural and MRI.

## 2015-12-26 ENCOUNTER — Telehealth: Payer: Self-pay

## 2015-12-26 MED ORDER — CYCLOBENZAPRINE HCL 10 MG PO TABS: ORAL_TABLET | ORAL | Status: DC

## 2015-12-26 NOTE — Telephone Encounter (Signed)
Happy to switch to flexeril.

## 2015-12-26 NOTE — Telephone Encounter (Signed)
Shirley Vasquez states she thought the muscle relaxer was going to be changed for Robaxin to something less sedating. Please advise.

## 2015-12-26 NOTE — Telephone Encounter (Signed)
Patient advised.

## 2015-12-31 ENCOUNTER — Encounter: Payer: Self-pay | Admitting: Physical Therapy

## 2015-12-31 ENCOUNTER — Ambulatory Visit (INDEPENDENT_AMBULATORY_CARE_PROVIDER_SITE_OTHER): Payer: Managed Care, Other (non HMO) | Admitting: Physical Therapy

## 2015-12-31 DIAGNOSIS — M436 Torticollis: Secondary | ICD-10-CM

## 2015-12-31 DIAGNOSIS — M25511 Pain in right shoulder: Secondary | ICD-10-CM | POA: Diagnosis not present

## 2015-12-31 DIAGNOSIS — R252 Cramp and spasm: Secondary | ICD-10-CM

## 2015-12-31 DIAGNOSIS — M542 Cervicalgia: Secondary | ICD-10-CM

## 2015-12-31 DIAGNOSIS — M25512 Pain in left shoulder: Secondary | ICD-10-CM

## 2015-12-31 NOTE — Patient Instructions (Signed)
Scapular Retraction (Standing)  [place] [phone], use heat     With arms at sides, pinch shoulder blades together. Repeat __10__ times per set. Do __1__ sets per session. Do _4-5___ sessions per day.   Resisted External Rotation: in Neutral - Bilateral    Sit or stand, tubing in both hands, elbows at sides, bent to 90, forearms forward. Pinch shoulder blades together and rotate forearms out. Keep elbows at sides. Repeat __10__ times per set. Do __2__ sets per session. Do __2__ sessions per day.   Strengthening: Shoulder Shrug (Phase 1)    Shrug shoulders up and down. Repeat __10__ times per set. Do __1__ sets per session. Do __4-5__ sessions per day.  Scapula Adduction With Pectorals, Low   Stand in doorframe with palms against frame and arms at 45. Lean forward and squeeze shoulder blades. Hold _30-45__ seconds. Repeat _1__ times per session. Do _2__ sessions per day.  Scapula Adduction With Pectorals, Mid-Range   Stand in doorframe with palms against frame and arms at 90. Lean forward and squeeze shoulder blades. Hold _30-45__ seconds. Repeat _1__ times per session. Do _2__ sessions per day. Copyright  VHI. All rights reserved.

## 2015-12-31 NOTE — Therapy (Signed)
[facility] [place], [place], [address] Phone: [phone]   Fax:  [phone]  Physical Therapy Evaluation  Patient Details  Name: Shirley Vasquez MRN: [id number] Date of Birth: 19-Dec-1965 Referring Provider: Dr Sophronia Simas  Encounter Date: 12/31/2015      PT End of Session - 12/31/15 0848    Visit Number 1   Number of Visits 8   Date for PT Re-Evaluation 01/28/16   PT Start Time 0849   PT Stop Time 0942   PT Time Calculation (min) 53 min   Activity Tolerance Patient tolerated treatment well      Past Medical History  Diagnosis Date  . Anxiety   . Fibroid     Uterine ablation  . GERD (gastroesophageal reflux disease)   . Asthma     Past Surgical History  Procedure Laterality Date  . Cholecystectomy    . Bladder suspension    . Tonsillectomy and adenoidectomy    . Tubal ligation    . Uternine ablasion    . Wisdom tooth extraction      There were no vitals filed for this visit.  Visit Diagnosis:  Pain, neck - Plan: PT plan of care cert/re-cert  Pain in both shoulders - Plan: PT plan of care cert/re-cert  Stiffness of neck - Plan: PT plan of care cert/re-cert  Cramp in muscle - Plan: PT plan of care cert/re-cert      Subjective Assessment - 12/31/15 0838    Subjective Pt slipped on water in a [employer]ur[employer] and fell backwards hitting her head. Lost conciousness for a few minutes.  Had immediate pain, seen at ED and CT scan was negative for fx, was started on muscle relaxer and anti-imflammatory.  Was told by her MD that she has a disc issue in her neck from the fall.  Rt shoulder feels stiff now andhas pain in the upper shoulder. Currently has tightness in bilat upper trap areas, notices it more with forward activities, in early AM and evening she has pain down her Rt UE.  Also has some soreness in the center of her back.   Occassionally has a headache.    Pertinent History h/o LBP, bilat hip  bursitis and carpal tunnel. Goes to the gym walks for exercise.    How long can you sit comfortably? 30 min   How long can you walk comfortably? 20 min   Diagnostic tests CT scan cervical and thoracic ( -)    Patient Stated Goals get out of pain, have Rt UE mobility back without stiffness and feelings of limitation   Currently in Pain? Yes   Pain Score 7    Pain Location Neck   Pain Orientation Right;Left   Pain Descriptors / Indicators Pressure   Pain Type Acute pain   Pain Radiating Towards top of Rt shoulder    Pain Onset 1 to 4 weeks ago   Pain Frequency Constant   Aggravating Factors  sleeping, being still    Pain Relieving Factors heat             [facility] PT Assessment - 12/31/15 0001    Assessment   Medical Diagnosis Rt cervical radiculopathy   Referring Provider Dr Sophronia Simas   Onset Date/Surgical Date 12/22/15   Hand Dominance Right   Next MD Visit 01/22/16   Prior Therapy none for this   Precautions   Precautions None   Balance Screen   Has the patient fallen in the past 6  months Yes   How many times? 1  from her accident   [place] residence   Living Arrangements Spouse/significant other;Children   Prior Function   Level of [place] [employer] at home   Leisure going to [employer] - is currently only doing lower body until we clear her.    Observation/Other Assessments   Focus on Therapeutic Outcomes (FOTO)  54% limited   Posture/Postural Control   Posture/Postural Control Postural limitations   Postural Limitations Rounded Shoulders;Forward head   ROM / Strength   AROM / PROM / Strength AROM;Strength   AROM   AROM Assessment Site Cervical;Shoulder   Right/Left Shoulder --  bilat WNL   Cervical Flexion WNL   Cervical Extension WNL   Cervical - Right Side Bend WNL   Cervical - Left Side Bend WNL   Cervical - Right Rotation 63  with pain   Cervical - Left Rotation 60  with pain   Strength    Overall Strength Comments mid trap Rt 4-/5, Lt 4+/5 with pain in lower thoracic, lower traps 4/5    Strength Assessment Site Shoulder   Right/Left Shoulder --  bilat WNL   Palpation   Spinal mobility hypomobile in C & T spine    Palpation comment tightness in bilat upper traps,levators and cervical/thoracic paraspinals periocciput   Special Tests    Special Tests --  (-) spurlings.                    [place] Adult PT Treatment/Exercise - 12/31/15 0001    Exercises   Exercises Neck   Neck Exercises: Theraband   Shoulder External Rotation 20 reps  yellow band   Neck Exercises: Standing   Other Standing Exercises shoulder shrugs and retractions   Other Standing Exercises doorway stretch, low and mid   Modalities   Modalities Electrical Stimulation;Moist Heat   Moist Heat Therapy   Number Minutes Moist Heat 15 Minutes   Moist Heat Location Cervical   Electrical Stimulation   Electrical Stimulation Location cervical    Electrical Stimulation Action IFC   Electrical Stimulation Parameters  to tolerance   Electrical Stimulation Goals Pain;Tone                PT Education - 12/31/15 [phone]    Education provided Yes   Education Details HEP   Person(s) Educated Patient   Methods Explanation;Demonstration;Handout   Comprehension Returned demonstration             PT Long Term Goals - 12/31/15 0834    PT LONG TERM GOAL #1   Title I with advanced HEP ( 01/28/16)    Time 4   Period Weeks   Status New   PT LONG TERM GOAL #2   Title report overall symptom/pain decrease =/> 75% with daily activities ( 01/28/16)    Time 4   Period Weeks   Status New   PT LONG TERM GOAL #3   Title improve FOTO =/< 31% limited ( 01/28/16)    Time 4   Period Weeks   Status New   PT LONG TERM GOAL #4   Title demo bilat cervical rotation =/> 70 degrees without pain or difficulty ( 01/28/16)    Time 4   Period Weeks   Status New   PT LONG TERM GOAL #5   Title increase strength  mid traps =/> 4+/5 9 01/28/16)    Time 4  Period Weeks   Status New                Problem List Patient Active Problem List   Diagnosis Date Noted  . Right cervical radiculopathy 12/25/2015  . Morton's neuroma, right 3/4, left 2/3  03/13/2015  . Major depressive disorder, recurrent episode, moderate ([place]) 01/06/2015  . Stress headache 01/06/2015  . Low back pain 07/31/2014  . Greater trochanteric bursitis of both hips 04/01/2014  . Carpal tunnel syndrome 10/03/2013  . Asthma 10/18/2012  . Hyperlipidemia 06/12/2012  . GERD (gastroesophageal reflux disease) 03/15/2012  . Panic attacks 03/15/2012  . Anxiety 03/15/2012    Jeral Pinch PT  12/31/2015, 10:06 AM  [facility] [place], [place], [address] Phone: [phone]   Fax:  [phone]  Name: Shirley Vasquez [place] MRN: [id number] Date of Birth: 08-10-1966

## 2016-01-05 ENCOUNTER — Ambulatory Visit (INDEPENDENT_AMBULATORY_CARE_PROVIDER_SITE_OTHER): Payer: Managed Care, Other (non HMO) | Admitting: Physical Therapy

## 2016-01-05 DIAGNOSIS — M436 Torticollis: Secondary | ICD-10-CM | POA: Diagnosis not present

## 2016-01-05 DIAGNOSIS — M25512 Pain in left shoulder: Secondary | ICD-10-CM

## 2016-01-05 DIAGNOSIS — M25511 Pain in right shoulder: Secondary | ICD-10-CM

## 2016-01-05 DIAGNOSIS — M542 Cervicalgia: Secondary | ICD-10-CM

## 2016-01-05 DIAGNOSIS — R252 Cramp and spasm: Secondary | ICD-10-CM

## 2016-01-05 NOTE — Patient Instructions (Signed)
Over Head Pull: Narrow Grip     [place] [phone]   On back, knees bent, feet flat, band across thighs, elbows straight but relaxed. Pull hands apart (start). Keeping elbows straight, bring arms up and over head, hands toward floor. Keep pull steady on band. Hold momentarily. Return slowly, keeping pull steady, back to start. Repeat _10__ times. Band color __yellow____    [facility] [place],  [address]  [phone] (office) [phone] (fax)

## 2016-01-05 NOTE — Therapy (Signed)
[place], [place], [address] Phone: [phone]   Fax:  [phone]  Physical Therapy Treatment  Patient Details  Name: Shirley Vasquez MRN: [id number] Date of Birth: [birth year] Referring Provider: Dr Sophronia Simas  Encounter Date: 01/05/2016      PT End of Session - 01/05/16 1033    Visit Number 2   Number of Visits 8   Date for PT Re-Evaluation 01/28/16   PT Start Time 1020   PT Stop Time 1115   PT Time Calculation (min) 55 min   Activity Tolerance Patient tolerated treatment well   Behavior During Therapy Agitated      Past Medical History  Diagnosis Date  . Anxiety   . Fibroid     Uterine ablation  . GERD (gastroesophageal reflux disease)   . Asthma     Past Surgical History  Procedure Laterality Date  . Cholecystectomy    . Bladder suspension    . Tonsillectomy and adenoidectomy    . Tubal ligation    . Uternine ablasion    . Wisdom tooth extraction      There were no vitals filed for this visit.  Visit Diagnosis:  Pain, neck  Pain in both shoulders  Stiffness of neck  Cramp in muscle      Subjective Assessment - 01/05/16 1033    Subjective Pt reports she is feeling much better today.  Has continued to have headaches for the duration of day.  Headaches start in back of head; reduced with heat and OTC med.   Currently in Pain? Yes   Pain Score 4   was 8/10, took OTC med 2 hours ago.   Pain Location Head   Pain Orientation Posterior   Pain Descriptors / Indicators Tightness;Headache   Pain Radiating Towards across lower neck.    Aggravating Factors  being still    Pain Relieving Factors heat, OTC med             [facility] PT Assessment - 01/05/16 0001    Assessment   Medical Diagnosis Rt cervical radiculopathy   Onset Date/Surgical Date 12/22/15   Hand Dominance Right   Next MD Visit 01/22/16   Prior Therapy none for this   AROM   Cervical - Right Rotation  72  with pain   Cervical - Left Rotation 60  with pain          [facility] Adult PT Treatment/Exercise - 01/05/16 0001    Neck Exercises: Standing   Other Standing Exercises shoulder circles x 10 CW/ CCW.   Scap retraction with pool noodle x 5 sec hold x 10 reps;  bilat shoulder ER with scap retraction around pool noodle x 10 reps x 2 sets - with yellow band.    Other Standing Exercises doorway stretch, low and mid- 30 sec x 2 reps - VC for improved form.    Neck Exercises: Supine   Cervical Rotation Right;Left;5 reps   Shoulder Flexion Both;10 reps  yellow theraband   Other Supine Exercise horizontal abd with yellow band (bilat) x 10; started increasing RUE symptoms - stopped and resolved.    Modalities   Modalities Electrical Stimulation;Moist Heat   Moist Heat Therapy   Number Minutes Moist Heat 15 Minutes   Moist Heat Location Cervical   Electrical Stimulation   Electrical Stimulation Location cervical    Electrical Stimulation Action IFC   Electrical Stimulation Parameters to tolerance    Electrical Stimulation Goals Pain;Tone  Manual Therapy   Manual Therapy Myofascial release;Soft tissue mobilization   Manual therapy comments pt hooklying    Soft tissue mobilization to Rt thoracic paraspinals, rhomboid, bilat trap   Myofascial Release suboccipital release.                      PT Long Term Goals - 12/31/15 [id number]    PT LONG TERM GOAL #1   Title I with advanced HEP ( 01/28/16)    Time 4   Period Weeks   Status New   PT LONG TERM GOAL #2   Title report overall symptom/pain decrease =/> 75% with daily activities ( 01/28/16)    Time 4   Period Weeks   Status New   PT LONG TERM GOAL #3   Title improve FOTO =/< 31% limited ( 01/28/16)    Time 4   Period Weeks   Status New   PT LONG TERM GOAL #4   Title demo bilat cervical rotation =/> 70 degrees without pain or difficulty ( 01/28/16)    Time 4   Period Weeks   Status New   PT LONG TERM GOAL #5   Title  increase strength mid traps =/> 4+/5 9 01/28/16)    Time 4   Period Weeks   Status New               Plan - 01/05/16 1111    Clinical Impression Statement Pt tolerated all exercises well, with exception of slight increase in RUE symptoms with resisted horiz abd (resolved when activity stopped).  Pt demonstrated improved Rt cervical rotation. Pt reported decreased pain with manual therapy and further reduction with use of estim / MHP.  Progressing towards goals.    Pt will benefit from skilled therapeutic intervention in order to improve on the following deficits Postural dysfunction;Decreased strength;Pain;Increased muscle spasms   Rehab Potential Good   PT Frequency 2x / week   PT Duration 4 weeks   PT Treatment/Interventions Manual techniques;Therapeutic exercise;Moist Heat;Taping;Dry needling;Electrical Stimulation;Cryotherapy;Patient/family education;Neuromuscular re-education   PT Next Visit Plan manual therapy to neck and shoulder PRN, progress HEP as tolerated.    Consulted and Agree with Plan of Care Patient        Problem List Patient Active Problem List   Diagnosis Date Noted  . Right cervical radiculopathy 12/25/2015  . Morton's neuroma, right 3/4, left 2/3  03/13/2015  . Major depressive disorder, recurrent episode, moderate ([place]) 01/06/2015  . Stress headache 01/06/2015  . Low back pain 07/31/2014  . Greater trochanteric bursitis of both hips 04/01/2014  . Carpal tunnel syndrome 10/03/2013  . Asthma 10/18/2012  . Hyperlipidemia 06/12/2012  . GERD (gastroesophageal reflux disease) 03/15/2012  . Panic attacks 03/15/2012  . Anxiety 03/15/2012   Kerin Perna, PTA 01/05/2016 12:54 PM  [place], [place], [address] Phone: [phone]   Fax:  [phone]  Name: Shamille Barngrover MRN: [id number] Date of Birth: 1966-08-31

## 2016-01-08 ENCOUNTER — Ambulatory Visit (INDEPENDENT_AMBULATORY_CARE_PROVIDER_SITE_OTHER): Payer: Managed Care, Other (non HMO) | Admitting: Physical Therapy

## 2016-01-08 DIAGNOSIS — M25512 Pain in left shoulder: Secondary | ICD-10-CM

## 2016-01-08 DIAGNOSIS — R252 Cramp and spasm: Secondary | ICD-10-CM | POA: Diagnosis not present

## 2016-01-08 DIAGNOSIS — M542 Cervicalgia: Secondary | ICD-10-CM | POA: Diagnosis not present

## 2016-01-08 DIAGNOSIS — M25511 Pain in right shoulder: Secondary | ICD-10-CM

## 2016-01-08 NOTE — Therapy (Addendum)
[place], [place], [address] Phone: [phone]   Fax:  [phone]  Physical Therapy Treatment  Patient Details  Name: Shirley Vasquez MRN: [id number] Date of Birth: 01-Jun-1966 Referring Provider: Dr Sophronia Simas  Encounter Date: 01/08/2016      PT End of Session - 01/08/16 1026    Visit Number 3   Number of Visits 8   Date for PT Re-Evaluation 01/28/16   PT Start Time 1019   PT Stop Time 1116   PT Time Calculation (min) 57 min      Past Medical History  Diagnosis Date  . Anxiety   . Fibroid     Uterine ablation  . GERD (gastroesophageal reflux disease)   . Asthma     Past Surgical History  Procedure Laterality Date  . Cholecystectomy    . Bladder suspension    . Tonsillectomy and adenoidectomy    . Tubal ligation    . Uternine ablasion    . Wisdom tooth extraction      There were no vitals filed for this visit.  Visit Diagnosis:  Pain, neck  Pain in both shoulders  Cramp in muscle      Subjective Assessment - 01/08/16 1130    Subjective Pt reports she had a lot of relief from last session.  Pain creeps back with using RUE for blow drying hair or cooking.  Not taking any medication.    Currently in Pain? Yes   Pain Score 7    Pain Location Head   Pain Orientation Posterior   Pain Descriptors / Indicators Aching         [facility] Adult PT Treatment/Exercise - 01/08/16 0001    Neck Exercises: Machines for Strengthening   UBE (Upper Arm Bike) L1: 1 min forward/ 1 min backward    Neck Exercises: Standing   Other Standing Exercises shoulder circles x 10 CW/ CCW.     Other Standing Exercises doorway stretch, low and mid- 30 sec x 2 reps -  improved form.    Neck Exercises: Supine   Shoulder Flexion Both;10 reps  yellow theraband, 2 sets   Upper Extremity D2 Flexion;Theraband;5 reps  2 sets each side   Theraband Level (UE D2) Level 1 (Yellow)   Other Supine Exercise bilat  shoulder ER with yellow band x 10 reps, 2 sets; VC for form.    Modalities   Modalities Electrical Stimulation;Moist Heat   Moist Heat Therapy   Number Minutes Moist Heat 15 Minutes   Moist Heat Location Cervical   Electrical Stimulation   Electrical Stimulation Location cervial    Electrical Stimulation Action IFC   Electrical Stimulation Parameters to tolerance    Electrical Stimulation Goals Pain;Tone   Manual Therapy   Manual Therapy Myofascial release;Soft tissue mobilization   Manual therapy comments pt hooklying    Soft tissue mobilization to Rt thoracic paraspinals, rhomboid, bilat trap - pin and stretch with cervical rotation  then scap protraction   Myofascial Release suboccipital release.    Neck Exercises: Stretches   Upper Trapezius Stretch 2 reps;20 seconds   Other Neck Stretches seated, forearms on thighs - cervical flexion to neutral x 5 reps                     PT Long Term Goals - 12/31/15 [id number]    PT LONG TERM GOAL #1   Title I with advanced HEP ( 01/28/16)    Time 4  Period Weeks   Status New   PT LONG TERM GOAL #2   Title report overall symptom/pain decrease =/> 75% with daily activities ( 01/28/16)    Time 4   Period Weeks   Status New   PT LONG TERM GOAL #3   Title improve FOTO =/< 31% limited ( 01/28/16)    Time 4   Period Weeks   Status New   PT LONG TERM GOAL #4   Title demo bilat cervical rotation =/> 70 degrees without pain or difficulty ( 01/28/16)    Time 4   Period Weeks   Status New   PT LONG TERM GOAL #5   Title increase strength mid traps =/> 4+/5 9 01/28/16)    Time 4   Period Weeks   Status New               Plan - 01/08/16 1121    Clinical Impression Statement Pt tolerated all exercises well, reported 3 point decrease in pain with ther ex and further decrease with manual therapy and estim/MHP.  Progressing towards goals.    Pt will benefit from skilled therapeutic intervention in order to improve on the following  deficits Postural dysfunction;Decreased strength;Pain;Increased muscle spasms   Rehab Potential Good   PT Frequency 2x / week   PT Duration 4 weeks   PT Treatment/Interventions Manual techniques;Therapeutic exercise;Moist Heat;Taping;Dry needling;Electrical Stimulation;Cryotherapy;Patient/family education;Neuromuscular re-education   PT Next Visit Plan  Continue progressive postural stretching/ strengthening.  manual therapy to neck and shoulder PRN, Add sash exercise to HEP if tolerated.    Consulted and Agree with Plan of Care Patient        Problem List Patient Active Problem List   Diagnosis Date Noted  . Right cervical radiculopathy 12/25/2015  . Morton's neuroma, right 3/4, left 2/3  03/13/2015  . Major depressive disorder, recurrent episode, moderate ([place]) 01/06/2015  . Stress headache 01/06/2015  . Low back pain 07/31/2014  . Greater trochanteric bursitis of both hips 04/01/2014  . Carpal tunnel syndrome 10/03/2013  . Asthma 10/18/2012  . Hyperlipidemia 06/12/2012  . GERD (gastroesophageal reflux disease) 03/15/2012  . Panic attacks 03/15/2012  . Anxiety 03/15/2012   Kerin Perna, PTA 01/08/2016 11:32 AM  [place], [place], [address] Phone: [phone]   Fax:  [phone]  Name: Shirley Vasquez MRN: [id number] Date of Birth: 1965-10-19

## 2016-01-12 ENCOUNTER — Encounter: Payer: Managed Care, Other (non HMO) | Admitting: Rehabilitative and Restorative Service Providers"

## 2016-01-15 ENCOUNTER — Encounter: Payer: Self-pay | Admitting: Physical Therapy

## 2016-01-15 ENCOUNTER — Ambulatory Visit (INDEPENDENT_AMBULATORY_CARE_PROVIDER_SITE_OTHER): Payer: Managed Care, Other (non HMO) | Admitting: Physical Therapy

## 2016-01-15 DIAGNOSIS — M25512 Pain in left shoulder: Secondary | ICD-10-CM | POA: Diagnosis not present

## 2016-01-15 DIAGNOSIS — M436 Torticollis: Secondary | ICD-10-CM

## 2016-01-15 DIAGNOSIS — M25511 Pain in right shoulder: Secondary | ICD-10-CM | POA: Diagnosis not present

## 2016-01-15 DIAGNOSIS — R252 Cramp and spasm: Secondary | ICD-10-CM

## 2016-01-15 NOTE — Therapy (Signed)
[place], [place], [address] Phone: [phone]   Fax:  [phone]  Physical Therapy Treatment  Patient Details  Name: Shirley Vasquez MRN: [id number] Date of Birth: 1966-03-29 Referring Provider: Dr Sophronia Simas  Encounter Date: 01/15/2016      PT End of Session - 01/15/16 1105    Visit Number 4   Number of Visits 8   Date for PT Re-Evaluation 01/28/16   PT Start Time 1105   PT Stop Time 1153   PT Time Calculation (min) 48 min      Past Medical History  Diagnosis Date  . Anxiety   . Fibroid     Uterine ablation  . GERD (gastroesophageal reflux disease)   . Asthma     Past Surgical History  Procedure Laterality Date  . Cholecystectomy    . Bladder suspension    . Tonsillectomy and adenoidectomy    . Tubal ligation    . Uternine ablasion    . Wisdom tooth extraction      There were no vitals filed for this visit.      Subjective Assessment - 01/15/16 1110    Subjective Pt states she has had some changes more headaches from the back of her head to around the eyes, constant burning into her Rt hand.    Patient Stated Goals get out of pain, have Rt UE mobility back without stiffness and feelings of limitation   Currently in Pain? Yes   Pain Location Shoulder   Pain Orientation Right;Posterior   Pain Descriptors / Indicators Burning;Stabbing;Shooting;Throbbing;Tingling   Pain Type Acute pain   Pain Radiating Towards into Rt arm                         [facility] Adult PT Treatment/Exercise - 01/15/16 0001    Neck Exercises: Machines for Strengthening   UBE (Upper Arm Bike) L1x4' alt FWD/BWD   Neck Exercises: Seated   Other Seated Exercise side bend stretches    Neck Exercises: Supine   Cervical Isometrics 5 secs;10 reps  head press   Modalities   Modalities Traction;Ultrasound   Ultrasound   Ultrasound Location base of occiput Rt   Ultrasound Parameters  100%, 3.3 mHz 1.5 w/cm2   Ultrasound Goals Pain   Traction   Type of Traction Cervical   Min (lbs) 8   Max (lbs) 12   Hold Time 5   Rest Time 5   Time 15   Manual Therapy   Manual Therapy Joint mobilization;Soft tissue mobilization;Myofascial release   Manual therapy comments pt hooklying    Joint Mobilization cervical bilat UPA   Soft tissue mobilization bilat cervical paraspinals and upper traps/levators   Myofascial Release suboccipital release.                      PT Long Term Goals - 01/15/16 1142    PT LONG TERM GOAL #1   Title I with advanced HEP ( 01/28/16)    Status On-going   PT LONG TERM GOAL #2   Title report overall symptom/pain decrease =/> 75% with daily activities ( 01/28/16)    Status On-going   PT LONG TERM GOAL #3   Title improve FOTO =/< 31% limited ( 01/28/16)    Status On-going   PT LONG TERM GOAL #4   Title demo bilat cervical rotation =/> 70 degrees without pain or difficulty ( 01/28/16)    Status  On-going   PT LONG TERM GOAL #5   Title increase strength mid traps =/> 4+/5 9 01/28/16)    Status On-going               Plan - 01/15/16 1139    Clinical Impression Statement Shirley Vasquez is having an increase in radicular symptoms into her Rt UE. She has very tight muscles in the base of her skull and cervical paraspinals. No new goals met. Performed US and cervical traction to help loosen up the muscles.    Rehab Potential Good   PT Frequency 2x / week   PT Duration 4 weeks   PT Treatment/Interventions Manual techniques;Therapeutic exercise;Moist Heat;Taping;Dry needling;Electrical Stimulation;Cryotherapy;Patient/family education;Neuromuscular re-education;Traction   PT Next Visit Plan assess response to [place] and traction    Consulted and Agree with Plan of Care Patient      Patient will benefit from skilled therapeutic intervention in order to improve the following deficits and impairments:  Postural dysfunction, Decreased strength, Pain,  Increased muscle spasms  Visit Diagnosis: Pain in both shoulders  Cramp in muscle  Stiffness of neck     Problem List Patient Active Problem List   Diagnosis Date Noted  . Right cervical radiculopathy 12/25/2015  . Morton's neuroma, right 3/4, left 2/3  03/13/2015  . Major depressive disorder, recurrent episode, moderate ([place]) 01/06/2015  . Stress headache 01/06/2015  . Low back pain 07/31/2014  . Greater trochanteric bursitis of both hips 04/01/2014  . Carpal tunnel syndrome 10/03/2013  . Asthma 10/18/2012  . Hyperlipidemia 06/12/2012  . GERD (gastroesophageal reflux disease) 03/15/2012  . Panic attacks 03/15/2012  . Anxiety 03/15/2012    Jeral Pinch PT 01/15/2016, 11:44 AM  [facility] [place], [place], [address] Phone: [phone]   Fax:  [phone]  Name: Shirley Vasquez MRN: [id number] Date of Birth: 06/23/1966

## 2016-01-19 ENCOUNTER — Encounter: Payer: Managed Care, Other (non HMO) | Admitting: Physical Therapy

## 2016-01-19 ENCOUNTER — Ambulatory Visit (INDEPENDENT_AMBULATORY_CARE_PROVIDER_SITE_OTHER): Payer: Managed Care, Other (non HMO) | Admitting: Physical Therapy

## 2016-01-19 DIAGNOSIS — M436 Torticollis: Secondary | ICD-10-CM

## 2016-01-19 DIAGNOSIS — M542 Cervicalgia: Secondary | ICD-10-CM

## 2016-01-19 DIAGNOSIS — M25511 Pain in right shoulder: Secondary | ICD-10-CM

## 2016-01-19 DIAGNOSIS — M25512 Pain in left shoulder: Secondary | ICD-10-CM

## 2016-01-19 DIAGNOSIS — R252 Cramp and spasm: Secondary | ICD-10-CM

## 2016-01-19 NOTE — Therapy (Signed)
[place], [place], [address] Phone: [phone]   Fax:  [phone]  Physical Therapy Treatment  Patient Details  Name: Shirley Vasquez MRN: [id number] Date of Birth: 1966-09-07 Referring Provider: Dr Sophronia Simas  Encounter Date: 01/19/2016      PT End of Session - 01/19/16 1434    Visit Number 5   Number of Visits 8   Date for PT Re-Evaluation 01/28/16   PT Start Time 1432   PT Stop Time 1536   PT Time Calculation (min) 64 min      Past Medical History  Diagnosis Date  . Anxiety   . Fibroid     Uterine ablation  . GERD (gastroesophageal reflux disease)   . Asthma     Past Surgical History  Procedure Laterality Date  . Cholecystectomy    . Bladder suspension    . Tonsillectomy and adenoidectomy    . Tubal ligation    . Uternine ablasion    . Wisdom tooth extraction      There were no vitals filed for this visit.      Subjective Assessment - 01/19/16 1434    Subjective Pt reports she was sore after last treatment.  Headaches and pain into Rt arm continue.  Pt reports she is having difficulty sleeping lately and is unsure what she might be doing that is aggrivating neck.     Diagnostic tests CT scan cervical and thoracic ( -)    Patient Stated Goals get out of pain, have Rt UE mobility back without stiffness and feelings of limitation   Currently in Pain? Yes   Pain Score 9    Pain Location Neck   Pain Orientation Right;Posterior   Pain Descriptors / Indicators Burning;Stabbing   Pain Radiating Towards lateral Rt arm down to wrist   Aggravating Factors  sleeping positions   Pain Relieving Factors muscle relaxers, heat          [facility] Adult PT Treatment/Exercise - 01/19/16 0001    Neck Exercises: Machines for Strengthening   UBE (Upper Arm Bike) L1: 1 min each direction    Neck Exercises: Standing   Other Standing Exercises Standing RUE nerve glides.    Neck Exercises:  Supine   Cervical Isometrics 3 secs;10 reps  chin tuck (axial ext)   Upper Extremity D2 Flexion;10 reps;Theraband  2 sets   Theraband Level (UE D2) Level 1 (Yellow)   Other Supine Exercise snow angel x 10 reps;   RUE nerve glides with lateral neck flexion and Rt wrist ext x 10 reps.   shoulder ER with yellow band x 12 reps x 2 sets   Other Supine Exercise scap retraction x 5 sec hold x 10 rep   Moist Heat Therapy   Number Minutes Moist Heat 15 Minutes   Moist Heat Location --  upper thoracic area   Traction   Type of Traction Cervical   Min (lbs) 8   Max (lbs) 12   Hold Time 60   Rest Time 20   Time 13   Manual Therapy   Manual therapy comments pt hooklying    Soft tissue mobilization bilat cervical paraspinals and upper traps/levators   Myofascial Release suboccipital release.    Neck Exercises: Stretches   Upper Trapezius Stretch 2 reps;30 seconds                PT Education - 01/19/16 1547    Education provided Yes   Education Details  HEP   Person(s) Educated Patient   Methods Handout;Explanation;Demonstration   Comprehension Verbalized understanding;Returned demonstration             PT Long Term Goals - 01/15/16 1142    PT LONG TERM GOAL #1   Title I with advanced HEP ( 01/28/16)    Status On-going   PT LONG TERM GOAL #2   Title report overall symptom/pain decrease =/> 75% with daily activities ( 01/28/16)    Status On-going   PT LONG TERM GOAL #3   Title improve FOTO =/< 31% limited ( 01/28/16)    Status On-going   PT LONG TERM GOAL #4   Title demo bilat cervical rotation =/> 70 degrees without pain or difficulty ( 01/28/16)    Status On-going   PT LONG TERM GOAL #5   Title increase strength mid traps =/> 4+/5 9 01/28/16)    Status On-going               Plan - 01/19/16 1527    Clinical Impression Statement Pt tolerated supine exercises well, with reported reduction of pain by 6 points.  Pt reported some RUE radicular symptoms during last  few minutes of traction (despite Rt arm being supported); traction stopped early and symptoms decreased in sitting.  Pt making gradual progress towards goals.    Rehab Potential Good   PT Frequency 2x / week   PT Duration 4 weeks   PT Treatment/Interventions Manual techniques;Therapeutic exercise;Moist Heat;Taping;Dry needling;Electrical Stimulation;Cryotherapy;Patient/family education;Neuromuscular re-education;Traction   PT Next Visit Plan Assess response to change in HEP and 2nd use of traction. Measure neck ROM.   Consulted and Agree with Plan of Care Patient      Patient will benefit from skilled therapeutic intervention in order to improve the following deficits and impairments:  Postural dysfunction, Decreased strength, Pain, Increased muscle spasms  Visit Diagnosis: Pain in both shoulders  Cramp in muscle  Stiffness of neck  Pain, neck     Problem List Patient Active Problem List   Diagnosis Date Noted  . Right cervical radiculopathy 12/25/2015  . Morton's neuroma, right 3/4, left 2/3  03/13/2015  . Major depressive disorder, recurrent episode, moderate ([place]) 01/06/2015  . Stress headache 01/06/2015  . Low back pain 07/31/2014  . Greater trochanteric bursitis of both hips 04/01/2014  . Carpal tunnel syndrome 10/03/2013  . Asthma 10/18/2012  . Hyperlipidemia 06/12/2012  . GERD (gastroesophageal reflux disease) 03/15/2012  . Panic attacks 03/15/2012  . Anxiety 03/15/2012   Kerin Perna, PTA 01/19/2016 3:51 PM  [place], [place], [address] Phone: [phone]   Fax:  [phone]  Name: Shirley Vasquez MRN: [id number] Date of Birth: 1966/05/20

## 2016-01-19 NOTE — Patient Instructions (Signed)
  Head Press With [place] chin SLIGHTLY toward chest, keep mouth closed. Feel weight on back of head. Increase weight by pressing head down. Hold _5__ seconds. Relax. Repeat _10__ times. Surface: floor / bed    Shoulder Press   Press both shoulders down. Hold __5_ seconds. Repeat __10_ times. Surface: floor / bed  Arm Sweep: [place]     Keeping arms on floor, sweep out and up beyond head. Stop and stretch as tightness develops. Hold __10_ seconds. Repeat _10__ times. Do _1__ sessions per day.   Sash   On back, knees bent, feet flat, left hand on left hip, right hand above left. Pull right arm DIAGONALLY (hip to shoulder) across chest. Bring right arm along head toward floor. Hold momentarily. Slowly return to starting position. (hitch hike) Repeat _10__ times, 2 sets. Do with left arm. Band color __yellow____   Shoulder Rotation: Double Arm   On back, knees bent, feet flat, elbows tucked at sides, bent 90, hands palms up. Pull hands apart and down toward floor, keeping elbows near sides. Hold momentarily. Slowly return to starting position. Repeat _10__ times. Band color __yellow____   Neurovascular: Median Nerve Glide With Cervical Bias - Supine    Lie with neck supported, right arm out to side, elbow straight, thumb down, fingers and wrist bent back. Slowly move opposite side ear toward shoulder as far as possible without pain. Repeat ____ times per set. Do _10___ sets per session. Do ____ sessions per week.  Copyright  VHI. All rights reserved.   [facility] at [facility] [place], [place] [address]  [phone] (office) [phone] (fax)

## 2016-01-22 ENCOUNTER — Ambulatory Visit: Payer: Managed Care, Other (non HMO) | Admitting: Sports Medicine

## 2016-01-22 ENCOUNTER — Encounter: Payer: Managed Care, Other (non HMO) | Admitting: Rehabilitative and Restorative Service Providers"

## 2016-01-23 ENCOUNTER — Ambulatory Visit (INDEPENDENT_AMBULATORY_CARE_PROVIDER_SITE_OTHER): Payer: Managed Care, Other (non HMO) | Admitting: Physical Therapy

## 2016-01-23 DIAGNOSIS — M542 Cervicalgia: Secondary | ICD-10-CM | POA: Diagnosis not present

## 2016-01-23 DIAGNOSIS — M25512 Pain in left shoulder: Secondary | ICD-10-CM

## 2016-01-23 DIAGNOSIS — R252 Cramp and spasm: Secondary | ICD-10-CM

## 2016-01-23 DIAGNOSIS — M436 Torticollis: Secondary | ICD-10-CM | POA: Diagnosis not present

## 2016-01-23 DIAGNOSIS — M25511 Pain in right shoulder: Secondary | ICD-10-CM | POA: Diagnosis not present

## 2016-01-23 NOTE — Therapy (Signed)
[place], [place], [address] Phone: [phone]   Fax:  [phone]  Physical Therapy Treatment  Patient Details  Name: Shirley Vasquez MRN: [id number] Date of Birth: Jan 05, 1966 Referring Provider: Dr. Helane Rima  Encounter Date: 01/23/2016      PT End of Session - 01/23/16 1200    Visit Number 6   Number of Visits 8   Date for PT Re-Evaluation 01/28/16   PT Start Time [id number]   PT Stop Time 1254   PT Time Calculation (min) 63 min   Activity Tolerance Patient tolerated treatment well;No increased pain      Past Medical History  Diagnosis Date  . Anxiety   . Fibroid     Uterine ablation  . GERD (gastroesophageal reflux disease)   . Asthma     Past Surgical History  Procedure Laterality Date  . Cholecystectomy    . Bladder suspension    . Tonsillectomy and adenoidectomy    . Tubal ligation    . Uternine ablasion    . Wisdom tooth extraction      There were no vitals filed for this visit.      Subjective Assessment - 01/23/16 1203    Subjective Pt reports the change to supine exercises and neural glides for RUE have helped.  Pt only waking 1x/night as opposed to 3x/night since last visit; has been propping RUE up and it has helped decrease radicular symptoms.   Overall pain has reduced a little.    Currently in Pain? Yes   Pain Score 6    Pain Location Neck   Pain Orientation Right;Posterior   Pain Descriptors / Indicators Burning   Pain Radiating Towards lateral Rt arm down to wrist   Aggravating Factors  sleeping positions   Pain Relieving Factors muscle relaxers, heat, decompression position            [facility] PT Assessment - 01/23/16 0001    Assessment   Medical Diagnosis Rt cervical radiculopathy   Referring Provider Dr. Helane Rima   Onset Date/Surgical Date 12/22/15   Hand Dominance Right   Next MD Visit 01/29/16          [facility] Adult PT Treatment/Exercise -  01/23/16 0001    Neck Exercises: Machines for Strengthening   UBE (Upper Arm Bike) L2: 1 min each direction    Neck Exercises: Supine   Shoulder Flexion Both;10 reps  red band, on 1/2 foam roll   Upper Extremity D2 Flexion;10 reps;Theraband  2 sets   Theraband Level (UE D2) Level 2 (Red)   Other Supine Exercise Hooklying on 1/2 foam roll: snow angel x 10 reps with 10 sec pause during ~125 deg abd; horiz abd with red band x 12 reps    Other Supine Exercise shoulder ER with red band x 12 reps, 2 sets   Moist Heat Therapy   Number Minutes Moist Heat 15 Minutes   Moist Heat Location Cervical  thoracic area   Electrical Stimulation   Electrical Stimulation Location cervical paraspinals/ upper trap   Electrical Stimulation Action IFC   Electrical Stimulation Parameters to tolerance    Electrical Stimulation Goals Tone;Pain   Manual Therapy   Manual Therapy Manual Traction   Manual therapy comments pt hooklying    Soft tissue mobilization bilat cervical paraspinals and upper traps/levators   Myofascial Release suboccipital release.    Manual Traction cervical : 30 sec hold x 4 reps  PT Long Term Goals - 01/15/16 1142    PT LONG TERM GOAL #1   Title I with advanced HEP ( 01/28/16)    Status On-going   PT LONG TERM GOAL #2   Title report overall symptom/pain decrease =/> 75% with daily activities ( 01/28/16)    Status On-going   PT LONG TERM GOAL #3   Title improve FOTO =/< 31% limited ( 01/28/16)    Status On-going   PT LONG TERM GOAL #4   Title demo bilat cervical rotation =/> 70 degrees without pain or difficulty ( 01/28/16)    Status On-going   PT LONG TERM GOAL #5   Title increase strength mid traps =/> 4+/5 9 01/28/16)    Status On-going               Plan - 01/23/16 1241    Clinical Impression Statement Pt tolerated increased band resistance without increase in symptoms or pain. Pt again having positive results with performing exercises in decompression  position.  Pt reported decrease in pain during exercise and further reduction to 2/10 with estim/.MHP at end of session.  Pt making gradual progress towards goals.    Rehab Potential Good   PT Frequency 2x / week   PT Duration 4 weeks   PT Treatment/Interventions Manual techniques;Therapeutic exercise;Moist Heat;Taping;Dry needling;Electrical Stimulation;Cryotherapy;Patient/family education;Neuromuscular re-education;Traction   PT Next Visit Plan Continue progressive postural strengthening/ manual work to upper thoracic/ cervical area.  Modalities as indicated.    Consulted and Agree with Plan of Care Patient      Patient will benefit from skilled therapeutic intervention in order to improve the following deficits and impairments:  Postural dysfunction, Decreased strength, Pain, Increased muscle spasms  Visit Diagnosis: Pain in both shoulders  Cramp in muscle  Stiffness of neck  Pain, neck     Problem List Patient Active Problem List   Diagnosis Date Noted  . Right cervical radiculopathy 12/25/2015  . Morton's neuroma, right 3/4, left 2/3  03/13/2015  . Major depressive disorder, recurrent episode, moderate ([place]) 01/06/2015  . Stress headache 01/06/2015  . Low back pain 07/31/2014  . Greater trochanteric bursitis of both hips 04/01/2014  . Carpal tunnel syndrome 10/03/2013  . Asthma 10/18/2012  . Hyperlipidemia 06/12/2012  . GERD (gastroesophageal reflux disease) 03/15/2012  . Panic attacks 03/15/2012  . Anxiety 03/15/2012   Kerin Perna, PTA 01/23/2016 12:59 PM  [place], [place], [address] Phone: [phone]   Fax:  [phone]  Name: Shirley Vasquez MRN: [id number] Date of Birth: [birth year]

## 2016-01-26 ENCOUNTER — Ambulatory Visit (INDEPENDENT_AMBULATORY_CARE_PROVIDER_SITE_OTHER): Payer: Managed Care, Other (non HMO) | Admitting: Physical Therapy

## 2016-01-26 DIAGNOSIS — M25512 Pain in left shoulder: Secondary | ICD-10-CM | POA: Diagnosis not present

## 2016-01-26 DIAGNOSIS — M25511 Pain in right shoulder: Secondary | ICD-10-CM

## 2016-01-26 DIAGNOSIS — R252 Cramp and spasm: Secondary | ICD-10-CM

## 2016-01-26 NOTE — Therapy (Signed)
[place], [place], [address] Phone: [phone]   Fax:  [phone]  Physical Therapy Treatment  Patient Details  Name: Shirley Vasquez MRN: [id number] Date of Birth: [birth year] Referring Provider: Dr. Helane Rima  Encounter Date: 01/26/2016      PT End of Session - 01/26/16 1019    Visit Number 7   Number of Visits 8   Date for PT Re-Evaluation 01/28/16   PT Start Time 1020   PT Stop Time 1110   PT Time Calculation (min) 50 min   Activity Tolerance Patient tolerated treatment well      Past Medical History  Diagnosis Date  . Anxiety   . Fibroid     Uterine ablation  . GERD (gastroesophageal reflux disease)   . Asthma     Past Surgical History  Procedure Laterality Date  . Cholecystectomy    . Bladder suspension    . Tonsillectomy and adenoidectomy    . Tubal ligation    . Uternine ablasion    . Wisdom tooth extraction      There were no vitals filed for this visit.      Subjective Assessment - 01/26/16 1020    Subjective Pt states she is doing OK, she is concerned that she still has a low grade headache on the Lt side of her head and a place on the Rt upper trap that will tingle into her hand. She is sleeping better, not waking with her hadn being so numb,    Patient Stated Goals get out of pain, have Rt UE mobility back without stiffness and feelings of limitation   Currently in Pain? Yes   Pain Score 5    Pain Location Neck   Pain Orientation Right   Pain Descriptors / Indicators Burning;Aching   Pain Type Acute pain   Pain Radiating Towards headache.    Pain Frequency Constant   Aggravating Factors  writing with pen- only tolerates about 2 sentences.    Pain Relieving Factors meds, relaxing                         [facility] Adult PT Treatment/Exercise - 01/26/16 0001    Neck Exercises: Machines for Strengthening   UBE (Upper Arm Bike) L2x4' alt FWD/BWD    Neck Exercises: Standing   Other Standing Exercises 20 reps shoulder shrugs, and retraction.    Other Standing Exercises Rt levator and upper trap streteches   Modalities   Modalities Electrical Stimulation;Moist Heat   Moist Heat Therapy   Number Minutes Moist Heat 15 Minutes   Moist Heat Location Cervical   Electrical Stimulation   Electrical Stimulation Location cervical paraspinals/ upper trap   Electrical Stimulation Action IFC   Electrical Stimulation Parameters to tolerance   Electrical Stimulation Goals Tone;Pain   Manual Therapy   Manual Therapy Joint mobilization;Soft tissue mobilization   Joint Mobilization cervical bilat UPA  decreased her headache   Soft tissue mobilization bilat cervical paraspinals and upper traps/levators          Trigger Point Dry Needling - 01/26/16 1048    Consent Given? Yes   Education Handout Provided Yes   Muscles Treated Upper Body Upper trapezius  Rt   Upper Trapezius Response Twitch reponse elicited;Palpable increased muscle length                   PT Long Term Goals - 01/15/16 1142    PT  LONG TERM GOAL #1   Title I with advanced HEP ( 01/28/16)    Status On-going   PT LONG TERM GOAL #2   Title report overall symptom/pain decrease =/> 75% with daily activities ( 01/28/16)    Status On-going   PT LONG TERM GOAL #3   Title improve FOTO =/< 31% limited ( 01/28/16)    Status On-going   PT LONG TERM GOAL #4   Title demo bilat cervical rotation =/> 70 degrees without pain or difficulty ( 01/28/16)    Status On-going   PT LONG TERM GOAL #5   Title increase strength mid traps =/> 4+/5 9 01/28/16)    Status On-going               Plan - 01/26/16 1102    Clinical Impression Statement [name] has been making slow progress, she has some frustration that she is not further along.  She tolerated TDN and spinal mobs with great results today.  She sees her Dr after the next visit.     Rehab Potential Good   PT Frequency 2x /  week   PT Duration 4 weeks   PT Treatment/Interventions Manual techniques;Therapeutic exercise;Moist Heat;Taping;Dry needling;Electrical Stimulation;Cryotherapy;Patient/family education;Neuromuscular re-education;Traction   PT Next Visit Plan reassess, FOTO will most likely benefit from a renewal.  She sees MD  after visit   Consulted and Agree with Plan of Care Patient      Patient will benefit from skilled therapeutic intervention in order to improve the following deficits and impairments:     Visit Diagnosis: Pain in both shoulders  Cramp in muscle     Problem List Patient Active Problem List   Diagnosis Date Noted  . Right cervical radiculopathy 12/25/2015  . Morton's neuroma, right 3/4, left 2/3  03/13/2015  . Major depressive disorder, recurrent episode, moderate ([place]) 01/06/2015  . Stress headache 01/06/2015  . Low back pain 07/31/2014  . Greater trochanteric bursitis of both hips 04/01/2014  . Carpal tunnel syndrome 10/03/2013  . Asthma 10/18/2012  . Hyperlipidemia 06/12/2012  . GERD (gastroesophageal reflux disease) 03/15/2012  . Panic attacks 03/15/2012  . Anxiety 03/15/2012    Jeral Pinch PT 01/26/2016, 11:04 AM  [facility] [place], [place], [address] Phone: [phone]   Fax:  [phone]  Name: Shirley Vasquez MRN: [id number] Date of Birth: June 15, 1966

## 2016-01-26 NOTE — Patient Instructions (Signed)

## 2016-01-29 ENCOUNTER — Ambulatory Visit
Admission: RE | Admit: 2016-01-29 | Discharge: 2016-01-29 | Disposition: A | Discharge status: Home / Self Care | Payer: Managed Care, Other (non HMO) | Source: Ambulatory Visit | Attending: Sports Medicine | Admitting: Sports Medicine

## 2016-01-29 ENCOUNTER — Ambulatory Visit (INDEPENDENT_AMBULATORY_CARE_PROVIDER_SITE_OTHER): Payer: Managed Care, Other (non HMO) | Admitting: Sports Medicine

## 2016-01-29 ENCOUNTER — Ambulatory Visit (INDEPENDENT_AMBULATORY_CARE_PROVIDER_SITE_OTHER): Payer: Managed Care, Other (non HMO) | Admitting: Physical Therapy

## 2016-01-29 VITALS — BP 128/85 | HR 88 | Resp 18 | Wt 170.7 lb

## 2016-01-29 DIAGNOSIS — R4189 Other symptoms and signs involving cognitive functions and awareness: Secondary | ICD-10-CM | POA: Diagnosis not present

## 2016-01-29 DIAGNOSIS — M5412 Radiculopathy, cervical region: Secondary | ICD-10-CM | POA: Diagnosis not present

## 2016-01-29 DIAGNOSIS — M436 Torticollis: Secondary | ICD-10-CM | POA: Diagnosis not present

## 2016-01-29 DIAGNOSIS — R252 Cramp and spasm: Secondary | ICD-10-CM

## 2016-01-29 DIAGNOSIS — M542 Cervicalgia: Secondary | ICD-10-CM | POA: Diagnosis not present

## 2016-01-29 DIAGNOSIS — M25512 Pain in left shoulder: Secondary | ICD-10-CM

## 2016-01-29 DIAGNOSIS — M25511 Pain in right shoulder: Secondary | ICD-10-CM | POA: Diagnosis not present

## 2016-01-29 NOTE — Progress Notes (Signed)
  Subjective:    CC: follow-up  HPI: Right cervical radiculopathy: Continues to improve, still has a bit of numbness in the fingers. Continues with physical therapy and need some more sessions.  Headache and memory loss: This did not occur until 2 weeks after the injury and head trauma. She notes mild, low-grade headache in the back of her head, and loss of memory as well as difficulty finding words. She tells me this has been progressive. She is nearly tearful describing her symptoms. There is also a history of depression. There was a negative head CT without contrast at the time of the injury. No localizing neurologic symptoms.  Past medical history, Surgical history, Family history not pertinant except as noted below, Social history, Allergies, and medications have been entered into the medical record, reviewed, and no changes needed.   Review of Systems: No fevers, chills, night sweats, weight loss, chest pain, or shortness of breath.   Objective:    General: Well Developed, well nourished, and in no acute distress.  Neuro: Alert and oriented x3, extra-ocular muscles intact, sensation grossly intact. Cranial nerves II through XII are intact, motor, sensory, and coordinative functions are all intact. HEENT: Normocephalic, atraumatic, pupils equal round reactive to light, neck supple, no masses, no lymphadenopathy, thyroid nonpalpable.  Skin: Warm and dry, no rashes. Cardiac: Regular rate and rhythm, no murmurs rubs or gallops, no lower extremity edema.  Respiratory: Clear to auscultation bilaterally. Not using accessory muscles, speaking in full sentences.  Impression and Recommendations:    I spent 25 minutes with this patient, greater than 50% was face-to-face time counseling regarding the above diagnoses

## 2016-01-29 NOTE — Assessment & Plan Note (Signed)
Continued improvement in right cervical radiculopathy with physical therapy. If persistent symptoms that one month we will proceed with an MRI and epidural.

## 2016-01-29 NOTE — Addendum Note (Signed)
Addended by: Everardo All on: 01/29/2016 01:19 PM   Modules accepted: Orders

## 2016-01-29 NOTE — Therapy (Addendum)
[place], [place], [address] Phone: [phone]   Fax:  [phone]  Physical Therapy Treatment  Patient Details  Name: Shirley Vasquez MRN: [id number] Date of Birth: July 16, 1966 Referring Provider: Dr. Helane Rima  Encounter Date: 01/29/2016      PT End of Session - 01/29/16 1027    Visit Number 8   Number of Visits 20  additional 12 visits added 01/29/16   Date for PT Re-Evaluation 03/12/16   PT Start Time 7619  pt arrived late due to traffic   PT Stop Time 1059   PT Time Calculation (min) 38 min   Activity Tolerance Patient tolerated treatment well;No increased pain      Past Medical History  Diagnosis Date  . Anxiety   . Fibroid     Uterine ablation  . GERD (gastroesophageal reflux disease)   . Asthma     Past Surgical History  Procedure Laterality Date  . Cholecystectomy    . Bladder suspension    . Tonsillectomy and adenoidectomy    . Tubal ligation    . Uternine ablasion    . Wisdom tooth extraction      There were no vitals filed for this visit.      Subjective Assessment - 01/29/16 1028    Subjective Pt reports her RUE radicular symptoms have improved, but her headaches have gotten worse (back of head, base of skull). Pt tearful at times, states she is frutrated with ongoing headaches and difficulty focusing and remembering things.  Pt reported she felt relief of tension in neck after TDN last session.    Patient Stated Goals get out of pain, have Rt UE mobility back without stiffness and feelings of limitation   Currently in Pain? Yes   Pain Score 8    Pain Location Head   Pain Orientation Posterior   Pain Descriptors / Indicators Heaviness;Sore  nausea   Multiple Pain Sites Yes   Pain Score 4   Pain Location Arm   Pain Orientation Right   Pain Descriptors / Indicators Tingling   Pain Radiating Towards down to fingertips             [facility] PT Assessment -  01/29/16 0001    Assessment   Medical Diagnosis Rt cervical radiculopathy   Referring Provider Dr. Helane Rima   Onset Date/Surgical Date 12/22/15   Hand Dominance Right   Next MD Visit 01/29/16   Observation/Other Assessments   Focus on Therapeutic Outcomes (FOTO)  41% limited.    AROM   Cervical Flexion WNL   Cervical Extension WNL   Cervical - Right Side Bend WNL   Cervical - Left Side Bend WNL   Cervical - Right Rotation 75   Cervical - Left Rotation 70   Strength   Overall Strength Comments mid  trap Rt 4/5, Lt 5/5. Low trap Rt 4/5, Lt 4+/5 with pain in lower thoracic         [facility] Adult PT Treatment/Exercise - 01/29/16 0001    Neck Exercises: Machines for Strengthening   UBE (Upper Arm Bike) L2: 1 min each direction    Neck Exercises: Supine   Cervical Rotation Right;Left;5 reps   Upper Extremity D2 Flexion;10 reps;Theraband  2 sets   Theraband Level (UE D2) Level 2 (Red)   Other Supine Exercise Hooklying on 1/2 foam roll: snow angel x 10 reps with 10 sec pause during ~125 deg abd; Shoulder flexion unilateral x 10 each arm; horiz abd  with red band x 12 reps    Other Supine Exercise shoulder ER with red band x 10 reps    Moist Heat Therapy   Number Minutes Moist Heat 10 Minutes   Moist Heat Location Cervical   Electrical Stimulation   Electrical Stimulation Location cervical paraspinals    Electrical Stimulation Action IFC   Electrical Stimulation Parameters  to tolerance    Electrical Stimulation Goals Pain                     PT Long Term Goals - 01/29/16 1104    PT LONG TERM GOAL #1   Title I with advanced HEP ( 03/12/16)    Time 6  6 weeks requested 01/29/16 total of 10 weeks for POC    Period Weeks   Status On-going   PT LONG TERM GOAL #2   Title report overall symptom/pain decrease =/> 75% with daily activities ( 03/12/16)    Time 6   Period Weeks   Status On-going   PT LONG TERM GOAL #3   Title improve FOTO =/< 31% limited ( 03/12/16)    Time  6   Period Weeks   Status On-going   PT LONG TERM GOAL #4   Title demo bilat cervical rotation =/> 70 degrees without pain or difficulty ( 03/12/16)    Time 6   Period Weeks   Status Achieved   PT LONG TERM GOAL #5   Title increase strength mid traps =/> 4+/5   (03/12/16)    Time 6   Period Weeks   Status On-going               Plan - 01/29/16 1104    Clinical Impression Statement Pt demonstrated improved cervical rotation; has met LTG # 4. Pt tolerated all exercises, reporting decrease in pain afterward.  Pt is progressing well towards remaining goals and will benefit from additional PT intervention to decrease symptoms and improve functional mobility and sleep.    Rehab Potential Good   PT Frequency 2x / week   PT Duration 4 weeks   PT Treatment/Interventions Manual techniques;Therapeutic exercise;Moist Heat;Taping;Dry needling;Electrical Stimulation;Cryotherapy;Patient/family education;Neuromuscular re-education;Traction   PT Next Visit Plan Renewal sent to MD.  Will continue progressive postural strengthening.    Consulted and Agree with Plan of Care Patient      Patient will benefit from skilled therapeutic intervention in order to improve the following deficits and impairments:  Postural dysfunction, Decreased strength, Pain, Increased muscle spasms  Visit Diagnosis: Pain in both shoulders  Cramp in muscle  Stiffness of neck  Pain, neck     Problem List Patient Active Problem List   Diagnosis Date Noted  . Impaired cognition 01/29/2016  . Right cervical radiculopathy 12/25/2015  . Morton's neuroma, right 3/4, left 2/3  03/13/2015  . Major depressive disorder, recurrent episode, moderate ([place]) 01/06/2015  . Stress headache 01/06/2015  . Low back pain 07/31/2014  . Greater trochanteric bursitis of both hips 04/01/2014  . Carpal tunnel syndrome 10/03/2013  . Asthma 10/18/2012  . Hyperlipidemia 06/12/2012  . GERD (gastroesophageal reflux disease) 03/15/2012   . Panic attacks 03/15/2012  . Anxiety 03/15/2012   Shirley Vasquez, Shirley Vasquez 01/29/2016 1:17 PM   Shirley Vasquez PT, MPH 01/29/2016 1:17 PM    [place], [place], [address] Phone: [phone]   Fax:  [phone]  Name: Shirley Vasquez MRN: [id number] Date of Birth: 01/27/1966

## 2016-01-29 NOTE — Assessment & Plan Note (Signed)
This is likely not related to concussion, the symptoms did not occur until 2 weeks after the head trauma. She did have a negative noncontrast head CT immediately following the injury, we are going to obtain another head CT to rule out slow subdural hematoma. She was nearly tearful discussing the side do suspect that her impaired cognition is more due to depression rather than her trauma. If the CT is negative I have asked her to follow-up with her primary care provider for further evaluation of her impaired cognition and memory loss. Occasionally we will do short courses of Adderall to improve this.

## 2016-01-30 ENCOUNTER — Ambulatory Visit: Payer: Managed Care, Other (non HMO) | Admitting: Sports Medicine

## 2016-02-02 ENCOUNTER — Ambulatory Visit (INDEPENDENT_AMBULATORY_CARE_PROVIDER_SITE_OTHER): Payer: Managed Care, Other (non HMO) | Admitting: Physician Assistant

## 2016-02-02 ENCOUNTER — Encounter: Payer: Self-pay | Admitting: Physician Assistant

## 2016-02-02 VITALS — BP 140/79 | HR 71 | Ht 63.0 in | Wt 167.0 lb

## 2016-02-02 DIAGNOSIS — F329 Major depressive disorder, single episode, unspecified: Secondary | ICD-10-CM

## 2016-02-02 DIAGNOSIS — F32A Depression, unspecified: Secondary | ICD-10-CM | POA: Insufficient documentation

## 2016-02-02 DIAGNOSIS — R4189 Other symptoms and signs involving cognitive functions and awareness: Secondary | ICD-10-CM

## 2016-02-02 DIAGNOSIS — F331 Major depressive disorder, recurrent, moderate: Secondary | ICD-10-CM

## 2016-02-02 DIAGNOSIS — S0990XA Unspecified injury of head, initial encounter: Secondary | ICD-10-CM

## 2016-02-02 MED ORDER — FLUOXETINE HCL 40 MG PO CAPS: 40.0000 mg | ORAL_CAPSULE | Freq: Every day | ORAL | Status: DC

## 2016-02-02 NOTE — Progress Notes (Addendum)
   Subjective:    Patient ID: Shirley Vasquez, female    DOB: 1966/06/26, 50 y.o.   MRN: [id number]  HPI Patient is here today complaining of headaches and difficulty with word finding for the past three weeks. She had a fall on 12/22/15 at a restaurant on a wet floor and landed on her head/back. The fall resulted in a bulging disc in her neck and a concussion. She went through physical therapy for 4 weeks at [facility]. She states she had about a 50% improvement with her neck pain and arm range of motion, tingling, and numbness. She had a repeat CT scan of the head 01/29/16 at [facility] which showed improvement of the bruising and swelling. She states that about three weeks ago she noticed that she was having difficulty finding words and putting thoughts together.  She reports feelings "foggy" which has caused her to feel very anxious. She has a history of anxiety and states she has been taking Prozac 20 MG daily and Clonazepam 2 times daily for the last three weeks. She is having dreams at night reliving the fall. She also feels like her headaches have not improved at all which is causing her to worry about long-term traumatic brain injury. She has been taking 1000mg  of Tylenol daily with no improvement.       Review of Systems  Neurological: Positive for headaches.  All other systems reviewed and are negative.      Objective:   Physical Exam  Constitutional: She appears well-developed and well-nourished.  HENT:  Head: Normocephalic and atraumatic.  Cardiovascular: Normal rate, regular rhythm and normal heart sounds.   Pulmonary/Chest: Effort normal and breath sounds normal.          Assessment & Plan:  1. Headaches- Patient reports continued dull headaches following a head injury 12/22/15. Continue Tylenol PRN. We also discussed muscle relaxation and massages. When headaches start make sure to practice brain rest. CT normal as of last week.      2. Impaired cognition- Patient reports difficulty with word finding for the past 3 weeks following a concussion. This aphasia could part of the postconcussion syndrome or depression itself that has been made worse by concussion. This is her third concussion, all of which have resulted in loss of consciousness, so traumatic brain injury is a possibility. If she continues to feel that she is not improving or worsening, we will consider a referral to neurology. I am hoping cognition improves with increasing prozac. Discussed adderall but she would like to hold off on today.  Follow up in 4 weeks.   3. Depression/anxiety- Patient has a history of anxiety which has been worse over the past three weeks. Patient states she is very anxious about her recovery and progress. Discussed with patient that she should not be so hard on herself about how quickly she is recoveirng, and that she must allow time to return to her pre-injury function. I do think she almost has some PTSD from fall in which she is having dreams that re-live the fall.  I will increase her Prozac to 40 MG daily. Continue Clonazepam as needed. We will follow up at her CPE. Follow up in 4 weeks.

## 2016-02-02 NOTE — Addendum Note (Signed)
Addended by: Donella Stade on: 02/02/2016 02:12 PM   Modules accepted: Level of Service

## 2016-02-02 NOTE — Patient Instructions (Signed)
Increase prozac to 40mg .

## 2016-02-11 ENCOUNTER — Ambulatory Visit (INDEPENDENT_AMBULATORY_CARE_PROVIDER_SITE_OTHER): Payer: Managed Care, Other (non HMO) | Admitting: Physical Therapy

## 2016-02-11 DIAGNOSIS — M436 Torticollis: Secondary | ICD-10-CM | POA: Diagnosis not present

## 2016-02-11 DIAGNOSIS — R252 Cramp and spasm: Secondary | ICD-10-CM

## 2016-02-11 DIAGNOSIS — M25512 Pain in left shoulder: Secondary | ICD-10-CM

## 2016-02-11 DIAGNOSIS — M542 Cervicalgia: Secondary | ICD-10-CM

## 2016-02-11 DIAGNOSIS — M25511 Pain in right shoulder: Secondary | ICD-10-CM | POA: Diagnosis not present

## 2016-02-11 NOTE — Patient Instructions (Signed)
Scapular Retraction: Abduction (Prone)    Lie with upper arms straight out from sides, elbows bent to 90. Pinch shoulder blades together and raise arms a few inches from floor. Raise head slightly off of towel.  Repeat _10___ times per set. Do __1-2__ sets per session. Do __3__ sessions per week.  * Can repeat with arms by your side.  * Afterward prop up on elbows and stretch head down/ and straight and then on the diagonal (nose towards armpit) - to loosen neck up.     [facility] [place], [place] [address]  [phone] (office) [phone] (fax)  [email or web address]   Copyright  VHI. All rights reserved.

## 2016-02-11 NOTE — Therapy (Addendum)
[facility] [place], [place], [address] Phone: [phone]   Fax:  [phone]  Physical Therapy Treatment  Patient Details  Name: Shirley Vasquez MRN: [id number] Date of Birth: 1966-06-01 Referring Provider: Dr. Helane Rima  Encounter Date: 02/11/2016      PT End of Session - 02/11/16 1112    Visit Number 9   Number of Visits 20   Date for PT Re-Evaluation 03/12/16   PT Start Time 6962   PT Stop Time 1204   PT Time Calculation (min) 59 min   Activity Tolerance Patient tolerated treatment well;No increased pain      Past Medical History  Diagnosis Date  . Anxiety   . Fibroid     Uterine ablation  . GERD (gastroesophageal reflux disease)   . Asthma     Past Surgical History  Procedure Laterality Date  . Cholecystectomy    . Bladder suspension    . Tonsillectomy and adenoidectomy    . Tubal ligation    . Uternine ablasion    . Wisdom tooth extraction      There were no vitals filed for this visit.      Subjective Assessment - 02/11/16 1134    Subjective Pt reports her headaches are not as severe, and she believes her neck ROM has improved. Continued radicular symptoms with blow drying hair and laying with arm abducted. Pt is planning to buy TENS unit for continued relief at home.  Pt reports overall 40% reduction in pain with functional activities since starting therapy.    Patient Stated Goals get out of pain, have Rt UE mobility back without stiffness and feelings of limitation   Currently in Pain? Yes   Pain Score 4    Pain Location Head   Pain Orientation Posterior   Pain Descriptors / Indicators Sore   Aggravating Factors  holding RUE up for functional activities   Pain Relieving Factors meds, heat,    Multiple Pain Sites No         [facility] Adult PT Treatment/Exercise - 02/11/16 0001    Neck Exercises: Machines for Strengthening   UBE (Upper Arm Bike) L2x4' alt FWD/BWD   Neck  Exercises: Standing   Other Standing Exercises Standing doorway stretch 3 positions, 3 reps each position.    Neck Exercises: Supine   Other Supine Exercise Hooklying on full foam roll: snow angel x 10 reps with 10 sec pause during ~125 deg abd, then bilat shoulder horiz abd with red band x 20 reps    Other Supine Exercise shoulder ER with red band x 10 reps    Neck Exercises: Prone   W Back 10 reps  with axial ext   Shoulder Extension 10 reps  with axial ext, 2 sets    Other Prone Exercise POE, cervical flexion to neutral, cervical diagonals x 5 each    Moist Heat Therapy   Number Minutes Moist Heat 12 Minutes   Moist Heat Location Cervical   Electrical Stimulation   Electrical Stimulation Location cervical paraspinals    Electrical Stimulation Action IFC   Electrical Stimulation Parameters  to tolerance    Electrical Stimulation Goals Pain   Manual Therapy   Manual therapy comments pt hooklying    Soft tissue mobilization bilat cervical paraspinals and upper traps/levators   Myofascial Release suboccipital release, scalene, Rt upper trap.                 PT Education - 02/11/16 1243  Education provided Yes   Education Details HEP - added prone scap retraction with axial extension.    Person(s) Educated Patient   Methods Explanation;Handout   Comprehension Verbalized understanding;Returned demonstration             PT Long Term Goals - 01/29/16 1104    PT LONG TERM GOAL #1   Title I with advanced HEP ( 03/12/16)    Time 6  6 weeks requested 01/29/16 total of 10 weeks for POC    Period Weeks   Status On-going   PT LONG TERM GOAL #2   Title report overall symptom/pain decrease =/> 75% with daily activities ( 03/12/16)    Time 6   Period Weeks   Status On-going   PT LONG TERM GOAL #3   Title improve FOTO =/< 31% limited ( 03/12/16)    Time 6   Period Weeks   Status On-going   PT LONG TERM GOAL #4   Title demo bilat cervical rotation =/> 70 degrees without  pain or difficulty ( 03/12/16)    Time 6   Period Weeks   Status Achieved   PT LONG TERM GOAL #5   Title increase strength mid traps =/> 4+/5   (03/12/16)    Time 6   Period Weeks   Status On-going               Plan - 02/11/16 1154    Clinical Impression Statement Pt tolerated full foam roll supine exercises and new prone exercises without increase in symptoms or pain. Noted decreased tightness in Rt cervical muscles with manual therapy today. Gradually progressing towards goals.    Rehab Potential Good   PT Duration 4 weeks   PT Treatment/Interventions Manual techniques;Therapeutic exercise;Moist Heat;Taping;Dry needling;Electrical Stimulation;Cryotherapy;Patient/family education;Neuromuscular re-education;Traction   PT Next Visit Plan continue progressive postural strengthening. Assess mid trapezius strength.   Consulted and Agree with Plan of Care Patient      Patient will benefit from skilled therapeutic intervention in order to improve the following deficits and impairments:  Postural dysfunction, Decreased strength, Pain, Increased muscle spasms  Visit Diagnosis: Pain in both shoulders  Cramp in muscle  Stiffness of neck  Pain, neck     Problem List Patient Active Problem List   Diagnosis Date Noted  . 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 Impaired cognition 01/29/2016  . Right cervical radiculopathy 12/25/2015  . Morton's neuroma, right 3/4, left 2/3  03/13/2015  . Major depressive disorder, recurrent episode, moderate ([place]) 01/06/2015  . Stress headache 01/06/2015  . Low back pain 07/31/2014  . Greater trochanteric bursitis of both hips 04/01/2014  . Carpal tunnel syndrome 10/03/2013  . Asthma 10/18/2012  . Hyperlipidemia 06/12/2012  . GERD (gastroesophageal reflux disease) 03/15/2012  . Panic attacks 03/15/2012  . Anxiety 03/15/2012   Kerin Perna, PTA 02/11/2016 1:05 PM  [facility] [place], [place], [address] Phone: [phone]   Fax:  [phone]  Name: Shirley Vasquez MRN: [id number] Date of Birth: [birth year]    PHYSICAL THERAPY DISCHARGE SUMMARY  Visits from Start of Care: 9  Current functional level related to goals / functional outcomes: See above for last visit info   Remaining deficits: unknown   Education / Equipment: HEP  Plan:  Patient goals were partially met. Patient is being discharged due to not returning since the last visit.  ?????   Jeral Pinch, PT 03/25/2016 10:45 AM

## 2016-02-16 ENCOUNTER — Encounter: Payer: Managed Care, Other (non HMO) | Admitting: Rehabilitative and Restorative Service Providers"

## 2016-02-16 ENCOUNTER — Ambulatory Visit (INDEPENDENT_AMBULATORY_CARE_PROVIDER_SITE_OTHER): Payer: Managed Care, Other (non HMO) | Admitting: Sports Medicine

## 2016-02-16 ENCOUNTER — Encounter: Payer: Self-pay | Admitting: Sports Medicine

## 2016-02-16 ENCOUNTER — Ambulatory Visit (INDEPENDENT_AMBULATORY_CARE_PROVIDER_SITE_OTHER): Payer: Managed Care, Other (non HMO)

## 2016-02-16 DIAGNOSIS — M5030 Other cervical disc degeneration, unspecified cervical region: Secondary | ICD-10-CM | POA: Diagnosis not present

## 2016-02-16 DIAGNOSIS — M5412 Radiculopathy, cervical region: Secondary | ICD-10-CM

## 2016-02-16 NOTE — Assessment & Plan Note (Signed)
Initial improvement cervical radiculopathy with physical therapy, right C7 distribution. Unfortunately persistent and recurrent pain, she's had x-rays, CT, greater than 6 weeks of formal physical therapy, NSAIDs, steroids. We are going to proceed with an MRI for interventional planning, return to go over MRI results and plan epidural.

## 2016-02-16 NOTE — Progress Notes (Signed)
  Subjective:    CC: Persistent neck pain  HPI: This is a pleasant 50 year old female, she has known cervical radiculopathy, this was after a fall. X-rays and CT were essentially unrevealing, more recently she improved after physical therapy but now is having a recurrence of pain, with severe pain in the neck with radiation to the second and third fingers. No lower extremity symptoms, no bowel or bladder dysfunction or saddle numbness.  Past medical history, Surgical history, Family history not pertinant except as noted below, Social history, Allergies, and medications have been entered into the medical record, reviewed, and no changes needed.   Review of Systems: No fevers, chills, night sweats, weight loss, chest pain, or shortness of breath.   Objective:    General: Well Developed, well nourished, and in no acute distress.  Neuro: Alert and oriented x3, extra-ocular muscles intact, sensation grossly intact.  HEENT: Normocephalic, atraumatic, pupils equal round reactive to light, neck supple, no masses, no lymphadenopathy, thyroid nonpalpable.  Skin: Warm and dry, no rashes. Cardiac: Regular rate and rhythm, no murmurs rubs or gallops, no lower extremity edema.  Respiratory: Clear to auscultation bilaterally. Not using accessory muscles, speaking in full sentences. Neck: Negative spurling's Full neck range of motion Grip strength and sensation normal in bilateral hands Strength good C4 to T1 distribution No sensory change to C4 to T1 Reflexes normal  Impression and Recommendations:    I spent 25 minutes with this patient, greater than 50% was face-to-face time counseling regarding the above diagnoses

## 2016-02-18 ENCOUNTER — Ambulatory Visit: Payer: Managed Care, Other (non HMO) | Admitting: Sports Medicine

## 2016-02-18 ENCOUNTER — Telehealth: Payer: Self-pay | Admitting: Physician Assistant

## 2016-02-18 ENCOUNTER — Encounter: Payer: Managed Care, Other (non HMO) | Admitting: Physical Therapy

## 2016-02-18 NOTE — Telephone Encounter (Signed)
Pt states she had a regular breakfast (hard boiled egg and orange juice) and had tremors and spasms in her hand accompanied with nausea and feeling faint. Would like to know if it could be related to all of her other symptoms.

## 2016-02-18 NOTE — Telephone Encounter (Signed)
We could change klonapin to xanax something a little stronger and could help with tremors and spasms. It could be the pain causing some spasms. We could also try muscle relaxer. She may need to come in.

## 2016-02-18 NOTE — Telephone Encounter (Signed)
Pt booked an appointment in the a.m.

## 2016-02-18 NOTE — Telephone Encounter (Signed)
Patient called and request to know if you can call her today. Pt still not feeling well (sick on stomach, faintish and still having nerve pain and not sure if its from the nerve pain) Thanks She got a call from [facility] and the earliest they have for an appt is May 26th at 7:45am.

## 2016-02-19 ENCOUNTER — Ambulatory Visit (INDEPENDENT_AMBULATORY_CARE_PROVIDER_SITE_OTHER): Payer: Managed Care, Other (non HMO) | Admitting: Physician Assistant

## 2016-02-19 ENCOUNTER — Other Ambulatory Visit: Payer: Self-pay | Admitting: Physician Assistant

## 2016-02-19 ENCOUNTER — Encounter: Payer: Self-pay | Admitting: Physician Assistant

## 2016-02-19 VITALS — BP 124/76 | HR 89 | Ht 63.0 in | Wt 168.0 lb

## 2016-02-19 DIAGNOSIS — M5412 Radiculopathy, cervical region: Secondary | ICD-10-CM | POA: Diagnosis not present

## 2016-02-19 DIAGNOSIS — F419 Anxiety disorder, unspecified: Secondary | ICD-10-CM | POA: Diagnosis not present

## 2016-02-19 MED ORDER — HYDROCODONE-ACETAMINOPHEN 5-325 MG PO TABS: 1.0000 | ORAL_TABLET | Freq: Three times a day (TID) | ORAL | Status: DC | PRN

## 2016-02-19 MED ORDER — NAPROXEN-ESOMEPRAZOLE 500-20 MG PO TBEC: DELAYED_RELEASE_TABLET | ORAL | Status: DC

## 2016-02-19 MED ORDER — CYCLOBENZAPRINE HCL ER 30 MG PO CP24: 30.0000 mg | ORAL_CAPSULE | Freq: Every day | ORAL | Status: DC | PRN

## 2016-02-19 NOTE — Progress Notes (Signed)
   Subjective:    Patient ID: Shirley Vasquez, female    DOB: 12/15/1965, 50 y.o.   MRN: [id number]  HPI  Pt presents to the clinic after yesterday feeling faint and shaky in her right extremity almost like a "spasm" yesterday. She has right cervical radiculopathy and has MRI scheduled May 26th. She does feel like tingling in extermities is worsening and her right fingers are numb and cold most of the time. She has increased klonapin to two times a day for the last 2 weeks. We increased her prozac at last visit and helping with overall anxiety. She feels so tight on the right side. Not taking any medications to help with symptoms. She is having a lot of problems sleeping due to symptoms.    Review of Systems  All other systems reviewed and are negative.      Objective:   Physical Exam  Constitutional: She is oriented to person, place, and time. She appears well-developed and well-nourished.  HENT:  Head: Normocephalic and atraumatic.  Cardiovascular: Normal rate, regular rhythm and normal heart sounds.   Pulmonary/Chest: Effort normal and breath sounds normal.  Neurological: She is alert and oriented to person, place, and time.  Psychiatric: She has a normal mood and affect. Her behavior is normal.          Assessment & Plan:  Right cervical radiculopathy- Vimovo sent to take bid. amrix given for muscle relaxer. norco 5/325 to use only as needed to help with pain at night to sleep and after epidural procedure. Follow up with Dr. Darene Lamer after epidural injection.  Anxiety- patient is doing overall well. Continue on klonapin. I think most of her anxiety is around the symptoms of radiculopathy. Maybe treatment above with help with that.

## 2016-02-24 ENCOUNTER — Other Ambulatory Visit: Payer: Self-pay | Admitting: Physician Assistant

## 2016-02-26 ENCOUNTER — Ambulatory Visit: Payer: Managed Care, Other (non HMO) | Admitting: Sports Medicine

## 2016-02-27 ENCOUNTER — Ambulatory Visit
Admission: RE | Admit: 2016-02-27 | Discharge: 2016-02-27 | Disposition: A | Discharge status: Home / Self Care | Payer: Managed Care, Other (non HMO) | Source: Ambulatory Visit | Attending: Sports Medicine | Admitting: Sports Medicine

## 2016-02-27 MED ORDER — IOPAMIDOL (ISOVUE-300) INJECTION 61%
1.0000 mL | Freq: Once | INTRAVENOUS | Status: AC | PRN
  Administered 2016-02-27: 1 mL

## 2016-02-27 MED ORDER — TRIAMCINOLONE ACETONIDE 40 MG/ML IJ SUSP (RADIOLOGY)
60.0000 mg | Freq: Once | INTRAMUSCULAR | Status: AC
  Administered 2016-02-27: 60 mg via EPIDURAL

## 2016-02-27 NOTE — Discharge Instructions (Signed)

## 2016-03-02 ENCOUNTER — Ambulatory Visit: Payer: Managed Care, Other (non HMO) | Admitting: Physician Assistant

## 2016-03-03 ENCOUNTER — Telehealth: Payer: Self-pay

## 2016-03-03 DIAGNOSIS — M5412 Radiculopathy, cervical region: Secondary | ICD-10-CM

## 2016-03-03 NOTE — Telephone Encounter (Signed)
Yes, we should proceed with epidural #2 of a series of potentially 3.  I will place the orders.

## 2016-03-03 NOTE — Telephone Encounter (Signed)
Pt had her epidural on May 26th and noted some sensation came back into her fingertips but by this has gone back to numbness and cramping. She is also not in PT at this moment but is doing her home PT and is wondering if there is any other precautions she should take or if she should do anything at this time since pain isn't gone. Please advise.

## 2016-03-03 NOTE — Progress Notes (Signed)
Called re: cervical epidural injection last Friday, 02/27/16. Pt states she is much better but still has pain in her fingers and below her elbow. Explained it takes 7-10 days for full effect. She is to also let her physician know in 2 weeks how she is doing.

## 2016-03-04 NOTE — Telephone Encounter (Signed)
Pt notified and Chrys Racer called for Epidural.

## 2016-03-05 ENCOUNTER — Telehealth: Payer: Self-pay

## 2016-03-05 ENCOUNTER — Other Ambulatory Visit: Payer: Self-pay | Admitting: Physician Assistant

## 2016-03-05 MED ORDER — MAGIC MOUTHWASH W/LIDOCAINE: 5.0000 mL | Freq: Four times a day (QID) | ORAL | Status: DC | PRN

## 2016-03-05 NOTE — Telephone Encounter (Signed)
Ok sent to pharmacy

## 2016-03-05 NOTE — Telephone Encounter (Signed)
Patient advised.

## 2016-03-05 NOTE — Telephone Encounter (Signed)
Shirley Vasquez states she gets blisters in her mouth after having steroid injection. She would like magic mouthwash sent in. Please advise.

## 2016-03-09 ENCOUNTER — Encounter: Payer: Self-pay | Admitting: Physician Assistant

## 2016-03-09 ENCOUNTER — Ambulatory Visit (INDEPENDENT_AMBULATORY_CARE_PROVIDER_SITE_OTHER): Payer: Managed Care, Other (non HMO) | Admitting: Physician Assistant

## 2016-03-09 VITALS — BP 132/78 | HR 82 | Temp 99.2°F | Wt 170.0 lb

## 2016-03-09 DIAGNOSIS — J0110 Acute frontal sinusitis, unspecified: Secondary | ICD-10-CM | POA: Diagnosis not present

## 2016-03-09 DIAGNOSIS — M5412 Radiculopathy, cervical region: Secondary | ICD-10-CM

## 2016-03-09 DIAGNOSIS — J069 Acute upper respiratory infection, unspecified: Secondary | ICD-10-CM | POA: Diagnosis not present

## 2016-03-09 MED ORDER — CEFDINIR 300 MG PO CAPS: 300.0000 mg | ORAL_CAPSULE | Freq: Two times a day (BID) | ORAL | Status: DC

## 2016-03-09 NOTE — Progress Notes (Signed)
   Subjective:    Patient ID: Shirley Vasquez, female    DOB: December 20, 1965, 50 y.o.   MRN: [id number]  HPI  Pt presents to the clinic with low grade fever, ST, sinus pressure and headache. Symptoms started Thursday evening after her epidural injections. Started with feeling achy and having chills. On Friday she had some ulcers in mouth. They have improved with magic mouthwash. Saturday night more tenderness over maxillary and frontal headache. No ear pain. She did use albuterol inhaler a few times after cough. Cough is minimally productive.   She has not reported any improvement after epidural injection. In fact she can't feel right 1-4th metatarsal. No pain, burning or tingling. She just has numbness now.     Review of Systems  All other systems reviewed and are negative.      Objective:   Physical Exam  Constitutional: She is oriented to person, place, and time. She appears well-developed and well-nourished.  HENT:  Head: Normocephalic and atraumatic.  Right Ear: External ear normal.  Left Ear: External ear normal.  Left ear tube noted. No erythema, blood or pus. No external tenderness.  Right ear clear.   Tenderness over frontal sinuses to palpation.   Bilateral nasal turbinates red and swollen.   PND present with erythematic oropharynx.   Eyes: Conjunctivae are normal. Right eye exhibits no discharge. Left eye exhibits no discharge.  Neck: Normal range of motion. Neck supple.  Non tender enlarged lymphadenopathy of anterior cervical region.   Cardiovascular: Normal rate, regular rhythm and normal heart sounds.   Pulmonary/Chest: Effort normal and breath sounds normal. She has no wheezes.  Lymphadenopathy:    She has cervical adenopathy.  Neurological: She is alert and oriented to person, place, and time.  With eyes closed no sensation to light touch 1-4th metatarsal of right hand.   Skin: Skin is dry.  Psychiatric: She has a normal mood and affect. Her behavior is normal.           Assessment & Plan:  URI/frontal sinusitis- treated with omnicef due to PCN allergy. flonase and mucinex to consider. Rest and hydrate. Follow up as needed.   Right cervical radiculopathy- 1st injection done 02/27/16. No improvement. Cannot feel 1-4th metatarsal. Continue with 2nd epidural injection. Likely need to consider neurosurgery referral if not improving.

## 2016-03-25 ENCOUNTER — Other Ambulatory Visit: Payer: Self-pay | Admitting: Obstetrics and Gynecology

## 2016-03-26 ENCOUNTER — Ambulatory Visit
Admission: RE | Admit: 2016-03-26 | Discharge: 2016-03-26 | Disposition: A | Discharge status: Home / Self Care | Payer: Managed Care, Other (non HMO) | Source: Ambulatory Visit | Attending: Sports Medicine | Admitting: Sports Medicine

## 2016-03-26 MED ORDER — IOPAMIDOL (ISOVUE-M 300) INJECTION 61%
1.0000 mL | Freq: Once | INTRAMUSCULAR | Status: AC | PRN
  Administered 2016-03-26: 1 mL via EPIDURAL

## 2016-03-26 MED ORDER — TRIAMCINOLONE ACETONIDE 40 MG/ML IJ SUSP (RADIOLOGY)
60.0000 mg | Freq: Once | INTRAMUSCULAR | Status: AC
  Administered 2016-03-26: 60 mg via EPIDURAL

## 2016-03-26 NOTE — Discharge Instructions (Signed)

## 2016-04-09 ENCOUNTER — Ambulatory Visit (INDEPENDENT_AMBULATORY_CARE_PROVIDER_SITE_OTHER): Payer: Managed Care, Other (non HMO) | Admitting: Sports Medicine

## 2016-04-09 DIAGNOSIS — M5412 Radiculopathy, cervical region: Secondary | ICD-10-CM | POA: Diagnosis not present

## 2016-04-09 NOTE — Assessment & Plan Note (Signed)
Has done very well with 2 cervical epidurals, still has some paresthesias in the second third and fourth fingers. Wrist exam is unremarkable, however I do think we are going to proceed with nerve conduction and EMG, as well as referral to Dr. Lynann Bologna for consideration of surgical intervention.

## 2016-04-09 NOTE — Progress Notes (Signed)
  Subjective:    CC: follow-up after epidural  HPI:  this is a pleasant 50 year old female, she has right-sided hand paresthesias as well as cervical degenerative disc disease on MRI. Initially we obtain an MRI, and proceeded with 2 cervical epidurals which provided great relief of her neck pain. Unfortunately she continued to have paresthesias into her second, third, fourth fingers.  Past medical history, Surgical history, Family history not pertinant except as noted below, Social history, Allergies, and medications have been entered into the medical record, reviewed, and no changes needed.   Review of Systems: No fevers, chills, night sweats, weight loss, chest pain, or shortness of breath.   Objective:    General: Well Developed, well nourished, and in no acute distress.  Neuro: Alert and oriented x3, extra-ocular muscles intact, sensation grossly intact.  HEENT: Normocephalic, atraumatic, pupils equal round reactive to light, neck supple, no masses, no lymphadenopathy, thyroid nonpalpable.  Skin: Warm and dry, no rashes. Cardiac: Regular rate and rhythm, no murmurs rubs or gallops, no lower extremity edema.  Respiratory: Clear to auscultation bilaterally. Not using accessory muscles, speaking in full sentences. Right Wrist: Inspection normal with no visible erythema or swelling. ROM smooth and normal with good flexion and extension and ulnar/radial deviation that is symmetrical with opposite wrist. Palpation is normal over metacarpals, navicular, lunate, and TFCC; tendons without tenderness/ swelling No snuffbox tenderness. No tenderness over Canal of [place]. Strength 5/5 in all directions without pain. Negative Finkelstein, tinel's and phalens. Negative Watson's test.  Impression and Recommendations:

## 2016-04-17 ENCOUNTER — Other Ambulatory Visit: Payer: Self-pay | Admitting: Physician Assistant

## 2016-05-03 ENCOUNTER — Encounter: Payer: Self-pay | Admitting: Physician Assistant

## 2016-05-03 ENCOUNTER — Ambulatory Visit (INDEPENDENT_AMBULATORY_CARE_PROVIDER_SITE_OTHER): Payer: Managed Care, Other (non HMO) | Admitting: Physician Assistant

## 2016-05-03 VITALS — BP 121/64 | HR 74 | Ht 63.0 in | Wt 169.0 lb

## 2016-05-03 DIAGNOSIS — E7800 Pure hypercholesterolemia, unspecified: Secondary | ICD-10-CM

## 2016-05-03 DIAGNOSIS — F331 Major depressive disorder, recurrent, moderate: Secondary | ICD-10-CM

## 2016-05-03 DIAGNOSIS — F410 Panic disorder [episodic paroxysmal anxiety] without agoraphobia: Secondary | ICD-10-CM | POA: Diagnosis not present

## 2016-05-03 DIAGNOSIS — Z0000 Encounter for general adult medical examination without abnormal findings: Secondary | ICD-10-CM | POA: Diagnosis not present

## 2016-05-03 DIAGNOSIS — Z1211 Encounter for screening for malignant neoplasm of colon: Secondary | ICD-10-CM

## 2016-05-03 DIAGNOSIS — K219 Gastro-esophageal reflux disease without esophagitis: Secondary | ICD-10-CM | POA: Diagnosis not present

## 2016-05-03 DIAGNOSIS — Z131 Encounter for screening for diabetes mellitus: Secondary | ICD-10-CM

## 2016-05-03 DIAGNOSIS — E559 Vitamin D deficiency, unspecified: Secondary | ICD-10-CM

## 2016-05-03 DIAGNOSIS — F419 Anxiety disorder, unspecified: Secondary | ICD-10-CM

## 2016-05-03 LAB — COMPLETE METABOLIC PANEL WITH GFR
ALBUMIN: 3.8 g/dL (ref 3.6–5.1)
ALK PHOS: 46 U/L (ref 33–130)
ALT: 13 U/L (ref 6–29)
AST: 15 U/L (ref 10–35)
BUN: 12 mg/dL (ref 7–25)
CALCIUM: 9 mg/dL (ref 8.6–10.4)
CO2: 27 mmol/L (ref 20–31)
CREATININE: 0.78 mg/dL (ref 0.50–1.05)
Chloride: 107 mmol/L (ref 98–110)
GFR, Est Non African American: 89 mL/min (ref >=60)
Glucose, Bld: 85 mg/dL (ref 65–99)
Potassium: 4.6 mmol/L (ref 3.5–5.3)
Sodium: 138 mmol/L (ref 135–146)
Total Bilirubin: 0.4 mg/dL (ref 0.2–1.2)
Total Protein: 6 g/dL — ABNORMAL LOW (ref 6.1–8.1)

## 2016-05-03 LAB — LIPID PANEL
CHOLESTEROL: 201 mg/dL — AB (ref 125–200)
HDL: 68 mg/dL (ref >=46)
LDL Cholesterol: 118 mg/dL (ref <130)
Total CHOL/HDL Ratio: 3 Ratio (ref <=5.0)
Triglycerides: 76 mg/dL (ref <150)
VLDL: 15 mg/dL (ref <30)

## 2016-05-03 MED ORDER — CLONAZEPAM 1 MG PO TABS: ORAL_TABLET | ORAL | 5 refills | Status: DC

## 2016-05-03 MED ORDER — FLUOXETINE HCL 40 MG PO CAPS: 40.0000 mg | ORAL_CAPSULE | Freq: Every day | ORAL | 5 refills | Status: DC

## 2016-05-03 MED ORDER — ESOMEPRAZOLE MAGNESIUM 40 MG PO CPDR: DELAYED_RELEASE_CAPSULE | ORAL | 11 refills | Status: DC

## 2016-05-03 NOTE — Progress Notes (Addendum)
Patient ID: Shirley Vasquez, female   DOB: 02/16/1966, 50 y.o.   MRN: [id number]  Chief Complaint  Patient presents with  . Annual Exam    HPI Shirley Vasquez is a 50 y.o. female that presents for complete physical exam. She states she is concerned about her heart health due to maternal family history. She reports being up to date on her pap and mammogram. She has not other complaints at this time.  HPI  Past Medical History:  Diagnosis Date  . Anxiety   . Asthma   . Fibroid    Uterine ablation  . GERD (gastroesophageal reflux disease)     Past Surgical History:  Procedure Laterality Date  . BLADDER SUSPENSION    . CHOLECYSTECTOMY    . TONSILLECTOMY AND ADENOIDECTOMY    . TUBAL LIGATION    . uternine ablasion    . WISDOM TOOTH EXTRACTION      Family History  Problem Relation Age of Onset  . Cancer Mother     breast  . Heart failure Mother   . Hypertension Mother   . Heart attack Mother     Social History Social History  Substance Use Topics  . Smoking status: Never Smoker  . Smokeless tobacco: Not on file  . Alcohol use No    Allergies  Allergen Reactions  . Amoxicillin Hives  . Augmentin [Amoxicillin-Pot Clavulanate] Other (See Comments)    Irritates upper and lower GI system  . Buprenorphine Hcl Other (See Comments)    Over-sedates; hard to breathe well in PACU after surgery  . Buspar [Buspirone] Other (See Comments)    headache  . Codeine Nausea And Vomiting  . Hctz [Hydrochlorothiazide] Hives  . Morphine And Related Other (See Comments)    Over-sedates; hard to breathe well in PACU after surgery  . Sulfa Antibiotics Hives  . Sulfacetamide Sodium Hives  . Wellbutrin [Bupropion] Rash    Current Outpatient Prescriptions  Medication Sig Dispense Refill  . albuterol (PROVENTIL HFA;VENTOLIN HFA) 108 (90 BASE) MCG/ACT inhaler Inhale 1-2 puffs into the lungs every 6 (six) hours as needed for wheezing or shortness of breath. 1 Inhaler 0  . clonazePAM  (KLONOPIN) 1 MG tablet TAKE 1 TABLET(1 MG TOTAL) BY MOUTH 2 TIMES DAILY AS NEEDED 60 tablet 5  . esomeprazole (NEXIUM) 40 MG capsule TAKE ONE CAPSULE BY MOUTH EVERY DAY AT NOON 30 capsule 11  . FLUoxetine (PROZAC) 40 MG capsule Take 1 capsule (40 mg total) by mouth daily. 30 capsule 5  . meloxicam (MOBIC) 15 MG tablet Take 15 mg by mouth 2 (two) times daily.    . methocarbamol (ROBAXIN) 500 MG tablet Take 500 mg by mouth 2 (two) times daily as needed for muscle spasms.    . Multiple Vitamin (MULTIVITAMIN) tablet Take 1 tablet by mouth daily.       No current facility-administered medications for this visit.     Review of Systems Review of Systems  All other systems reviewed and are negative.   Blood pressure 121/64, pulse 74, height 5\' 3"  (1.6 m), weight 169 lb (76.7 kg).  Physical Exam Physical Exam  Constitutional: She is oriented to person, place, and time. She appears well-developed and well-nourished.  HENT:  Head: Normocephalic and atraumatic.  Right Ear: Tympanic membrane, external ear and ear canal normal.  Left Ear: External ear and ear canal normal.  Tympanostomy tube in the left TM  Eyes: Pupils are equal, round, and reactive to light.  Neck: Normal range of motion.  Neck supple.  Cardiovascular: Normal rate, regular rhythm and normal heart sounds.   Pulmonary/Chest: Effort normal and breath sounds normal.  Abdominal: Soft. Bowel sounds are normal.  Musculoskeletal: Normal range of motion. She exhibits no edema.  Neurological: She is alert and oriented to person, place, and time. She has normal reflexes.  Skin: Skin is warm and dry.  Psychiatric: She has a normal mood and affect. Her behavior is normal.     Assessment/Plan    CPE- Patient is doing well. She is up to date on her vaccines. She had a mammogram last year and plans to get another this November. Pap will be done on next year visit. Will make a referral for colonoscopy. She declined HIV screening test. Will  order CMP, screening lipid panel and Vitamin D. Medications were refilled. Encouraged vitamin D 1000mg  daily and calcium 1500mg  daily.    GERD- refilled nexium.   Anxiety/MDD/panic attacks- prozac and klonapin refilled.

## 2016-05-04 ENCOUNTER — Encounter: Payer: Self-pay | Admitting: Physician Assistant

## 2016-05-04 DIAGNOSIS — E559 Vitamin D deficiency, unspecified: Secondary | ICD-10-CM | POA: Insufficient documentation

## 2016-05-04 LAB — VITAMIN D 25 HYDROXY (VIT D DEFICIENCY, FRACTURES): Vit D, 25-Hydroxy: 24 ng/mL — ABNORMAL LOW (ref 30–100)

## 2016-05-04 NOTE — Progress Notes (Signed)
Increase vitamin D to 1000 units daily take with protein to help better absorb.

## 2016-05-24 ENCOUNTER — Encounter: Payer: Self-pay | Admitting: Neurology

## 2016-05-24 ENCOUNTER — Ambulatory Visit (INDEPENDENT_AMBULATORY_CARE_PROVIDER_SITE_OTHER): Payer: Managed Care, Other (non HMO) | Admitting: Neurology

## 2016-05-24 ENCOUNTER — Ambulatory Visit (INDEPENDENT_AMBULATORY_CARE_PROVIDER_SITE_OTHER): Payer: Self-pay | Admitting: Neurology

## 2016-05-24 DIAGNOSIS — G5603 Carpal tunnel syndrome, bilateral upper limbs: Secondary | ICD-10-CM

## 2016-05-24 DIAGNOSIS — G5601 Carpal tunnel syndrome, right upper limb: Secondary | ICD-10-CM

## 2016-05-24 NOTE — Procedures (Signed)
     HISTORY:  Tenisa Fleetwood is a 50 year old patient with a history of fall in a restaurant on 12/22/2015. The patient has sustained some discomfort and numbness into the right hand, some neck and shoulder pain that has improved with epidural steroid injections. The patient is being evaluated for the ongoing discomfort of the right hand and arm.  NERVE CONDUCTION STUDIES:  Nerve conduction studies were performed on both upper extremities. The distal motor latencies for the median nerves were prolonged on the right, normal on the left, with a low motor amplitude on the right, normal on the left. The distal motor latencies and motor amplitude for the ulnar nerves were normal bilaterally. The F wave latencies and nerve conduction velocities for the median and ulnar nerves were normal bilaterally. The sensory latencies for the median nerves were prolonged on the right, normal on the left, and normal for the ulnar nerves bilaterally.  EMG STUDIES:  EMG study was performed on the right upper extremity:  The first dorsal interosseous muscle reveals 2 to 4 K units with full recruitment. No fibrillations or positive waves were noted. The abductor pollicis brevis muscle reveals 2 to 4 K units with decreased recruitment. No fibrillations or positive waves were noted. The extensor indicis proprius muscle reveals 1 to 3 K units with full recruitment. No fibrillations or positive waves were noted. The pronator teres muscle reveals 2 to 3 K units with full recruitment. No fibrillations or positive waves were noted. The biceps muscle reveals 1 to 2 K units with full recruitment. No fibrillations or positive waves were noted. The triceps muscle reveals 2 to 4 K units with full recruitment. No fibrillations or positive waves were noted. The anterior deltoid muscle reveals 2 to 3 K units with full recruitment. No fibrillations or positive waves were noted. The cervical paraspinal muscles were tested at 2  levels. No abnormalities of insertional activity were seen at either level tested. There was poor relaxation.   IMPRESSION:  Nerve conduction studies done on both upper extremities shows evidence of a right carpal tunnel syndrome of moderate severity. EMG evaluation of the right upper extremity shows no evidence of an overlying cervical radiculopathy.  Jill Alexanders MD 05/24/2016 11:20 AM  [facility] [address] [place], [place] [address]  Phone [phone] Fax [phone]

## 2016-05-24 NOTE — Progress Notes (Signed)
Please refer to EMG and nerve conduction study procedure note. 

## 2016-05-26 ENCOUNTER — Telehealth: Payer: Self-pay | Admitting: Neurology

## 2016-05-26 NOTE — Telephone Encounter (Signed)
Pt called in this morning stating she was already in pain before her office visit, but is feeling a little soreness afterwards. I explained to the pt that yes there could be some discomfort afterwards. She expressed understanding.

## 2016-07-26 ENCOUNTER — Other Ambulatory Visit: Payer: Self-pay | Admitting: Orthopedic Surgery

## 2016-07-26 DIAGNOSIS — M542 Cervicalgia: Secondary | ICD-10-CM

## 2016-07-27 ENCOUNTER — Ambulatory Visit
Admission: RE | Admit: 2016-07-27 | Discharge: 2016-07-27 | Disposition: A | Discharge status: Home / Self Care | Payer: Managed Care, Other (non HMO) | Source: Ambulatory Visit | Attending: Orthopedic Surgery | Admitting: Orthopedic Surgery

## 2016-07-27 DIAGNOSIS — M542 Cervicalgia: Secondary | ICD-10-CM

## 2016-07-28 ENCOUNTER — Other Ambulatory Visit: Payer: Self-pay | Admitting: Physician Assistant

## 2016-08-01 ENCOUNTER — Other Ambulatory Visit: Payer: Managed Care, Other (non HMO)

## 2016-08-06 ENCOUNTER — Other Ambulatory Visit: Payer: Self-pay | Admitting: Physician Assistant

## 2016-08-06 ENCOUNTER — Telehealth: Payer: Self-pay | Admitting: Physician Assistant

## 2016-08-06 DIAGNOSIS — Z1231 Encounter for screening mammogram for malignant neoplasm of breast: Secondary | ICD-10-CM

## 2016-08-06 NOTE — Telephone Encounter (Signed)
Patient called adv that she is due for a mammogram and spoke with a rep at [facility] she is having discomfort on the left side and [facility] adv pt to call our office and ask Luvenia Starch to fax over Bilateral Diagnostic Mammogram and Ultra Sound of Left Breast order (f) [phone]. Thanks

## 2016-08-09 ENCOUNTER — Other Ambulatory Visit: Payer: Self-pay | Admitting: Physician Assistant

## 2016-08-09 DIAGNOSIS — N644 Mastodynia: Secondary | ICD-10-CM

## 2016-08-09 NOTE — Telephone Encounter (Signed)
Done

## 2016-08-11 ENCOUNTER — Telehealth: Payer: Self-pay | Admitting: Physician Assistant

## 2016-08-11 NOTE — Telephone Encounter (Signed)
Patient states that [facility] on [address] is requesting a referral for pt to be able to be seen for the mammogram. See prevs phone note. [place] Fax Number Thanks

## 2016-08-11 NOTE — Telephone Encounter (Signed)
Already ordered

## 2016-08-16 ENCOUNTER — Ambulatory Visit: Admission: RE | Admit: 2016-08-16 | Payer: Managed Care, Other (non HMO) | Source: Ambulatory Visit

## 2016-08-16 ENCOUNTER — Ambulatory Visit
Admission: RE | Admit: 2016-08-16 | Discharge: 2016-08-16 | Disposition: A | Discharge status: Home / Self Care | Payer: Managed Care, Other (non HMO) | Source: Ambulatory Visit | Attending: Physician Assistant | Admitting: Physician Assistant

## 2016-08-18 ENCOUNTER — Telehealth: Payer: Self-pay | Admitting: Physician Assistant

## 2016-08-18 DIAGNOSIS — F331 Major depressive disorder, recurrent, moderate: Secondary | ICD-10-CM

## 2016-08-18 DIAGNOSIS — F419 Anxiety disorder, unspecified: Secondary | ICD-10-CM

## 2016-08-18 DIAGNOSIS — S0990XA Unspecified injury of head, initial encounter: Secondary | ICD-10-CM

## 2016-08-18 DIAGNOSIS — F410 Panic disorder [episodic paroxysmal anxiety] without agoraphobia: Secondary | ICD-10-CM

## 2016-08-18 DIAGNOSIS — F329 Major depressive disorder, single episode, unspecified: Secondary | ICD-10-CM

## 2016-08-18 DIAGNOSIS — F32A Depression, unspecified: Secondary | ICD-10-CM

## 2016-08-18 NOTE — Telephone Encounter (Signed)
Yes, ok to make referral to speak with counselor downstairs.

## 2016-08-18 NOTE — Telephone Encounter (Signed)
Patient called and request to know if she can have a referral to [facility] to speak with a counselor. Thanks

## 2016-08-25 NOTE — Addendum Note (Signed)
Addended by: Beatris Ship L on: 08/25/2016 11:42 AM   Modules accepted: Orders

## 2016-10-02 ENCOUNTER — Other Ambulatory Visit: Payer: Self-pay | Admitting: Physician Assistant

## 2016-10-09 ENCOUNTER — Emergency Department (INDEPENDENT_AMBULATORY_CARE_PROVIDER_SITE_OTHER)
Admission: EM | Admit: 2016-10-09 | Discharge: 2016-10-09 | Disposition: A | Discharge status: Home / Self Care | Payer: Managed Care, Other (non HMO) | Source: Home / Self Care | Attending: Family Medicine | Admitting: Family Medicine

## 2016-10-09 DIAGNOSIS — J069 Acute upper respiratory infection, unspecified: Secondary | ICD-10-CM

## 2016-10-09 DIAGNOSIS — R0981 Nasal congestion: Secondary | ICD-10-CM

## 2016-10-09 DIAGNOSIS — B9789 Other viral agents as the cause of diseases classified elsewhere: Secondary | ICD-10-CM

## 2016-10-09 MED ORDER — DOXYCYCLINE HYCLATE 100 MG PO CAPS: 100.0000 mg | ORAL_CAPSULE | Freq: Two times a day (BID) | ORAL | 0 refills | Status: DC

## 2016-10-09 MED ORDER — PREDNISONE 20 MG PO TABS: ORAL_TABLET | ORAL | 0 refills | Status: DC

## 2016-10-09 MED ORDER — BENZONATATE 200 MG PO CAPS: ORAL_CAPSULE | ORAL | 0 refills | Status: DC

## 2016-10-09 NOTE — Discharge Instructions (Signed)
Continue plain guaifenesin (1200mg  extended release tabs such as Mucinex) twice daily, with plenty of water, for cough and congestion.  May add Pseudoephedrine (30mg , one or two every 4 to 6 hours) for sinus congestion.  Get adequate rest.   May use Afrin nasal spray (or generic oxymetazoline) twice daily for about 5 days and then discontinue.  Also recommend using saline nasal spray several times daily and saline nasal irrigation (AYR is a common brand).  Use Flonase nasal spray each morning after using Afrin nasal spray and saline nasal irrigation. Try warm salt water gargles for sore throat.  Stop all antihistamines for now, and other non-prescription cough/cold preparations. Continue albuterol inhaler as needed. Begin Doxycycline if not improving about one week or if persistent fever develops   Follow-up with family doctor if not improving about10 days.

## 2016-10-09 NOTE — ED Provider Notes (Signed)
[facility]    CSN: [id number] Arrival date & time: 10/09/16  [id number]     History   Chief Complaint Chief Complaint  Patient presents with  . Facial Pain    HPI Shirley Vasquez is a 51 y.o. female.   About two weeks ago patient developed typical cold-like symptoms developing over several days, including mild sore throat, sinus congestion, headache, fatigue, and cough.  She has mild asthma that only flares when she gets a cold.  She initially thought that her seasonal allergies were flaring.  Her cough has developed during the past four days, and she complains of tightness over her sternum, but no wheezing.  No pleuritic pain.  Her albuterol inhaler has been helpful, however.  No fevers, chills, and sweats.   The history is provided by the patient.    Past Medical History:  Diagnosis Date  . Anxiety   . Asthma   . Fibroid    Uterine ablation  . GERD (gastroesophageal reflux disease)     Patient Active Problem List   Diagnosis Date Noted  . Vitamin D deficiency 05/04/2016  . Elevated LDL cholesterol level 05/03/2016  . 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 Impaired cognition 01/29/2016  . Right cervical radiculopathy 12/25/2015  . Dysfunction of eustachian tube 08/12/2015  . Morton's neuroma, right 3/4, left 2/3  03/13/2015  . Major depressive disorder, recurrent episode, moderate ([place]) 01/06/2015  . Stress headache 01/06/2015  . Bursitis, trochanteric 08/07/2014  . Low back pain 07/31/2014  . Greater trochanteric bursitis of both hips 04/01/2014  . Carpal tunnel syndrome 10/03/2013  . Asthma 10/18/2012  . Hyperlipidemia 06/12/2012  . GERD (gastroesophageal reflux disease) 03/15/2012  . Panic attacks 03/15/2012  . Anxiety 03/15/2012    Past Surgical History:  Procedure Laterality Date  . BLADDER SUSPENSION    . CHOLECYSTECTOMY    . TONSILLECTOMY AND ADENOIDECTOMY    . TUBAL LIGATION    . uternine ablasion    . WISDOM TOOTH EXTRACTION       OB History    No data available       Home Medications    Prior to Admission medications   Medication Sig Start Date End Date Taking? Authorizing Provider  albuterol (PROVENTIL HFA;VENTOLIN HFA) 108 (90 BASE) MCG/ACT inhaler Inhale 1-2 puffs into the lungs every 6 (six) hours as needed for wheezing or shortness of breath. 06/29/15  Yes Noland Fordyce, PA-C  clonazePAM (KLONOPIN) 1 MG tablet TAKE 1 TABLET(1 MG TOTAL) BY MOUTH 2 TIMES DAILY AS NEEDED 05/03/16  Yes Jade L Breeback, PA-C  esomeprazole (NEXIUM) 40 MG capsule TAKE ONE CAPSULE BY MOUTH EVERY DAY AT NOON 05/03/16  Yes Jade L Breeback, PA-C  esomeprazole (NEXIUM) 40 MG capsule TAKE 1 CAPSULE BY MOUTH EVERY DAY AT NOON 07/28/16  Yes Jade L Breeback, PA-C  FLUoxetine (PROZAC) 40 MG capsule Take 1 capsule (40 mg total) by mouth daily. NEED FOLLOW UP VISIT FOR MORE REFILLS 10/05/16  Yes Jade L Breeback, PA-C  meloxicam (MOBIC) 15 MG tablet Take 15 mg by mouth 2 (two) times daily.   Yes Historical Provider, MD  methocarbamol (ROBAXIN) 500 MG tablet Take 500 mg by mouth 2 (two) times daily as needed for muscle spasms.   Yes Historical Provider, MD  Multiple Vitamin (MULTIVITAMIN) tablet Take 1 tablet by mouth daily.     Yes Historical Provider, MD  benzonatate (TESSALON) 200 MG capsule Take one cap by mouth at bedtime as needed for cough.  May repeat in 4 to 6 hours 10/09/16   Kandra Nicolas, MD  doxycycline (VIBRAMYCIN) 100 MG capsule Take 1 capsule (100 mg total) by mouth 2 (two) times daily. Take with food (Rx void after 10/17/16) 10/09/16   Kandra Nicolas, MD  predniSONE (DELTASONE) 20 MG tablet Take one tab by mouth twice daily for 5 days, then one daily for 3 days. Take with food. 10/09/16   Kandra Nicolas, MD    Family History Family History  Problem Relation Age of Onset  . Cancer Mother     breast  . Heart failure Mother   . Hypertension Mother   . Heart attack Mother     Social History Social History  Substance Use Topics   . Smoking status: Never Smoker  . Smokeless tobacco: Never Used  . Alcohol use No     Allergies   Amoxicillin; Augmentin [amoxicillin-pot clavulanate]; Buprenorphine hcl; Buspar [buspirone]; Codeine; Hctz [hydrochlorothiazide]; Morphine and related; Sulfa antibiotics; Sulfacetamide sodium; and Wellbutrin [bupropion]   Review of Systems Review of Systems + sore throat + hoarse + cough No pleuritic pain, but feels tight in anterior chest No wheezing + nasal congestion + post-nasal drainage No sinus pain/pressure No itchy/red eyes No earache No hemoptysis No SOB No fever/chills No nausea No vomiting No abdominal pain No diarrhea No urinary symptoms No skin rash + fatigue No myalgias + headache Used OTC meds without relief   Physical Exam Triage Vital Signs ED Triage Vitals  Enc Vitals Group     BP 10/09/16 0953 118/78     Pulse Rate 10/09/16 0953 93     Resp --      Temp 10/09/16 0953 97.8 F (36.6 C)     Temp Source 10/09/16 0953 Oral     SpO2 --      Weight 10/09/16 0953 169 lb (76.7 kg)     Height 10/09/16 0953 5\' 3"  (1.6 m)     Head Circumference --      Peak Flow --      Pain Score 10/09/16 0957 7     Pain Loc --      Pain Edu? --      Excl. in [place]? --    No data found.   Updated Vital Signs BP 118/78 (BP Location: Left Arm)   Pulse 93   Temp 97.8 F (36.6 C) (Oral)   Ht 5\' 3"  (1.6 m)   Wt 169 lb (76.7 kg)   BMI 29.94 kg/m   Visual Acuity Right Eye Distance:   Left Eye Distance:   Bilateral Distance:    Right Eye Near:   Left Eye Near:    Bilateral Near:     Physical Exam Nursing notes and Vital Signs reviewed. Appearance:  Patient appears stated age, and in no acute distress Eyes:  Pupils are equal, round, and reactive to light and accomodation.  Extraocular movement is intact.  Conjunctivae are not inflamed  Ears:  Canals normal.  Tympanic membranes normal (although left tympanic membrane has ventilation tube in place).  Nose:   Congested turbinates.  Mild maxillary sinus tenderness is present.  Pharynx:  Normal Neck:  Supple.  Tender enlarged posterior/lateral nodes are palpated bilaterally  Lungs:  Clear to auscultation.  Breath sounds are equal.  Moving air well. Chest:  Distinct tenderness to palpation over the mid-sternum.  Heart:  Regular rate and rhythm without murmurs, rubs, or gallops.  Abdomen:  Nontender without masses or hepatosplenomegaly.  Bowel sounds  are present.  No CVA or flank tenderness.  Extremities:  No edema.  Skin:  No rash present.    UC Treatments / Results  Labs (all labs ordered are listed, but only abnormal results are displayed) Labs Reviewed - No data to display  EKG  EKG Interpretation None       Radiology No results found.  Procedures Procedures (including critical care time)  Medications Ordered in UC Medications - No data to display   Initial Impression / Assessment and Plan / UC Course  I have reviewed the triage vital signs and the nursing notes.  Pertinent labs & imaging results that were available during my care of the patient were reviewed by me and considered in my medical decision making (see chart for details).  Clinical Course   There is no evidence of bacterial infection today.   Begin prednisone burst/taper.  Prescription written for Benzonatate [facility]) to take at bedtime for night-time cough.  Continue plain guaifenesin (1200mg  extended release tabs such as Mucinex) twice daily, with plenty of water, for cough and congestion.  May add Pseudoephedrine (30mg , one or two every 4 to 6 hours) for sinus congestion.  Get adequate rest.   May use Afrin nasal spray (or generic oxymetazoline) twice daily for about 5 days and then discontinue.  Also recommend using saline nasal spray several times daily and saline nasal irrigation (AYR is a common brand).  Use Flonase nasal spray each morning after using Afrin nasal spray and saline nasal irrigation. Try warm  salt water gargles for sore throat.  Stop all antihistamines for now, and other non-prescription cough/cold preparations. Continue albuterol inhaler as needed. Begin Doxycycline if not improving about one week or if persistent fever develops (Given a prescription to hold, with an expiration date)  Follow-up with family doctor if not improving about10 days.      Final Clinical Impressions(s) / UC Diagnoses   Final diagnoses:  Viral URI with cough  Nasal sinus congestion    New Prescriptions New Prescriptions   BENZONATATE (TESSALON) 200 MG CAPSULE    Take one cap by mouth at bedtime as needed for cough.  May repeat in 4 to 6 hours   DOXYCYCLINE (VIBRAMYCIN) 100 MG CAPSULE    Take 1 capsule (100 mg total) by mouth 2 (two) times daily. Take with food (Rx void after 10/17/16)   PREDNISONE (DELTASONE) 20 MG TABLET    Take one tab by mouth twice daily for 5 days, then one daily for 3 days. Take with food.     Kandra Nicolas, MD 10/09/16 1032

## 2016-10-09 NOTE — ED Triage Notes (Signed)
Patient c/o facial pain and pressure, cough, sinus drainage, states productive, yellow in color. She has tried mucinex and a nasal rinse. Symptoms for over a week.

## 2016-10-15 ENCOUNTER — Ambulatory Visit: Payer: Managed Care, Other (non HMO) | Admitting: Physician Assistant

## 2016-10-25 ENCOUNTER — Ambulatory Visit: Payer: Managed Care, Other (non HMO) | Admitting: Physician Assistant

## 2016-10-27 ENCOUNTER — Ambulatory Visit: Payer: Managed Care, Other (non HMO) | Admitting: Physician Assistant

## 2016-11-01 ENCOUNTER — Encounter: Payer: Self-pay | Admitting: Physician Assistant

## 2016-11-01 ENCOUNTER — Ambulatory Visit (INDEPENDENT_AMBULATORY_CARE_PROVIDER_SITE_OTHER): Payer: Managed Care, Other (non HMO) | Admitting: Physician Assistant

## 2016-11-01 VITALS — BP 130/79 | HR 92 | Temp 98.1°F | Wt 173.0 lb

## 2016-11-01 DIAGNOSIS — F419 Anxiety disorder, unspecified: Secondary | ICD-10-CM | POA: Diagnosis not present

## 2016-11-01 DIAGNOSIS — Z131 Encounter for screening for diabetes mellitus: Secondary | ICD-10-CM | POA: Diagnosis not present

## 2016-11-01 DIAGNOSIS — R05 Cough: Secondary | ICD-10-CM | POA: Diagnosis not present

## 2016-11-01 DIAGNOSIS — R058 Other specified cough: Secondary | ICD-10-CM

## 2016-11-01 DIAGNOSIS — R4789 Other speech disturbances: Secondary | ICD-10-CM

## 2016-11-01 DIAGNOSIS — Z13220 Encounter for screening for lipoid disorders: Secondary | ICD-10-CM

## 2016-11-01 DIAGNOSIS — R4189 Other symptoms and signs involving cognitive functions and awareness: Secondary | ICD-10-CM

## 2016-11-01 MED ORDER — FLUOXETINE HCL 20 MG PO TABS: ORAL_TABLET | ORAL | 1 refills | Status: DC

## 2016-11-01 MED ORDER — CLONAZEPAM 1 MG PO TABS: ORAL_TABLET | ORAL | 5 refills | Status: DC

## 2016-11-01 NOTE — Progress Notes (Signed)
   Subjective:    Patient ID: Shirley Vasquez, female    DOB: 12-Jul-1966, 51 y.o.   MRN: [id number]  HPI  Pt is here today for follow up after abx, prednisone for URI on 10/09/16. She is doing much better but still has a linguring dry cough. She had to stop prednisone because it was making her tremble. She is not taking anything for cough. She wants to "just make sure everything is ok".   She is doing much better overall after bilateral carpel tunnel surgery and PT for cervical radiculopathy. Making great improvements.   She still struggles with "brain fog". At times she just "can't find her words". This upsets her. This has been going on for over a year. CT of head was normal. Stress does seem to make it worse. She denies any headache, vision changes, or memory loss. She just feels she is struggling to focus.    Review of Systems  All other systems reviewed and are negative.      Objective:   Physical Exam  Constitutional: She is oriented to person, place, and time. She appears well-developed and well-nourished.  HENT:  Head: Normocephalic and atraumatic.  Cardiovascular: Normal rate, regular rhythm and normal heart sounds.   Pulmonary/Chest: Effort normal and breath sounds normal. She has no wheezes.  Neurological: She is alert and oriented to person, place, and time. She has normal reflexes. She displays normal reflexes. No cranial nerve deficit. Coordination normal.  Psychiatric: She has a normal mood and affect. Her behavior is normal.          Assessment & Plan:  Marland KitchenMarland KitchenAimee was seen today for cough.  Diagnoses and all orders for this visit:  Post-viral cough syndrome  Anxiety -     FLUoxetine (PROZAC) 20 MG tablet; Take 3 tablets daily for mood. -     VITAMIN D 25 Hydroxy (Vit-D Deficiency, Fractures) -     Vitamin B12 -     COMPLETE METABOLIC PANEL WITH GFR -     Lipid panel -     CBC with Differential/Platelet -     Ferritin -     clonazePAM (KLONOPIN) 1 MG tablet;  TAKE 1 TABLET(1 MG TOTAL) BY MOUTH 2 TIMES DAILY AS NEEDED  Word finding difficulty -     FLUoxetine (PROZAC) 20 MG tablet; Take 3 tablets daily for mood. -     VITAMIN D 25 Hydroxy (Vit-D Deficiency, Fractures) -     Vitamin B12 -     COMPLETE METABOLIC PANEL WITH GFR -     Lipid panel -     CBC with Differential/Platelet -     Ferritin  Screening for diabetes mellitus -     COMPLETE METABOLIC PANEL WITH GFR  Screening for lipid disorders -     Lipid panel  Impaired cognition -     FLUoxetine (PROZAC) 20 MG tablet; Take 3 tablets daily for mood.   Reassurance given about cough. Symptomatic care and letting run its course discussed.   Reassurance given about impaired cognition. I feel like her anxiety/depression is contributing to this. Increased prozac. Follow up in 1 month. Labs ordered to look at vitamin D and B12.   Spent 30 minutes with patient and greater than 50 percent of visit spend counseling patient on treatment plan.

## 2016-11-05 ENCOUNTER — Ambulatory Visit: Payer: Managed Care, Other (non HMO) | Admitting: Physician Assistant

## 2016-11-08 LAB — COMPLETE METABOLIC PANEL WITH GFR
ALBUMIN: 3.9 g/dL (ref 3.6–5.1)
ALK PHOS: 59 U/L (ref 33–130)
ALT: 17 U/L (ref 6–29)
AST: 16 U/L (ref 10–35)
BILIRUBIN TOTAL: 0.4 mg/dL (ref 0.2–1.2)
BUN: 12 mg/dL (ref 7–25)
CALCIUM: 9.1 mg/dL (ref 8.6–10.4)
CO2: 26 mmol/L (ref 20–31)
CREATININE: 0.74 mg/dL (ref 0.50–1.05)
Chloride: 106 mmol/L (ref 98–110)
GFR, Est African American: >89 mL/min (ref >=60)
GFR, Est Non African American: >89 mL/min (ref >=60)
GLUCOSE: 91 mg/dL (ref 65–99)
Potassium: 4.6 mmol/L (ref 3.5–5.3)
Sodium: 141 mmol/L (ref 135–146)
TOTAL PROTEIN: 6.4 g/dL (ref 6.1–8.1)

## 2016-11-08 LAB — CBC WITH DIFFERENTIAL/PLATELET
BASOS PCT: 1 %
Basophils Absolute: 37 cells/uL (ref 0–200)
EOS ABS: 74 {cells}/uL (ref 15–500)
Eosinophils Relative: 2 %
HEMATOCRIT: 31.3 % — AB (ref 35.0–45.0)
HEMOGLOBIN: 9.8 g/dL — AB (ref 11.7–15.5)
LYMPHS ABS: 1295 {cells}/uL (ref 850–3900)
Lymphocytes Relative: 35 %
MCH: 25.8 pg — ABNORMAL LOW (ref 27.0–33.0)
MCHC: 31.3 g/dL — ABNORMAL LOW (ref 32.0–36.0)
MCV: 82.4 fL (ref 80.0–100.0)
MONO ABS: 259 {cells}/uL (ref 200–950)
MPV: 10.3 fL (ref 7.5–12.5)
Monocytes Relative: 7 %
NEUTROS ABS: 2035 {cells}/uL (ref 1500–7800)
Neutrophils Relative %: 55 %
Platelets: 335 10*3/uL (ref 140–400)
RBC: 3.8 MIL/uL (ref 3.80–5.10)
RDW: 14.5 % (ref 11.0–15.0)
WBC: 3.7 10*3/uL — ABNORMAL LOW (ref 3.8–10.8)

## 2016-11-08 LAB — LIPID PANEL
CHOLESTEROL: 200 mg/dL — AB (ref <200)
HDL: 66 mg/dL (ref >50)
LDL Cholesterol: 113 mg/dL — ABNORMAL HIGH (ref <100)
Total CHOL/HDL Ratio: 3 Ratio (ref <5.0)
Triglycerides: 103 mg/dL (ref <150)
VLDL: 21 mg/dL (ref <30)

## 2016-11-08 LAB — VITAMIN B12: Vitamin B-12: 409 pg/mL (ref 200–1100)

## 2016-11-08 LAB — FERRITIN: Ferritin: 4 ng/mL — ABNORMAL LOW (ref 10–232)

## 2016-11-09 ENCOUNTER — Encounter: Payer: Self-pay | Admitting: Physician Assistant

## 2016-11-09 ENCOUNTER — Other Ambulatory Visit: Payer: Self-pay

## 2016-11-09 DIAGNOSIS — R790 Abnormal level of blood mineral: Secondary | ICD-10-CM | POA: Insufficient documentation

## 2016-11-09 DIAGNOSIS — D509 Iron deficiency anemia, unspecified: Secondary | ICD-10-CM | POA: Insufficient documentation

## 2016-11-09 LAB — VITAMIN D 25 HYDROXY (VIT D DEFICIENCY, FRACTURES): Vit D, 25-Hydroxy: 20 ng/mL — ABNORMAL LOW (ref 30–100)

## 2016-11-09 MED ORDER — VITAMIN D (ERGOCALCIFEROL) 1.25 MG (50000 UNIT) PO CAPS: 50000.0000 [IU] | ORAL_CAPSULE | ORAL | 0 refills | Status: DC

## 2016-11-09 MED ORDER — FERROUS SULFATE 325 (65 FE) MG PO TABS: 325.0000 mg | ORAL_TABLET | Freq: Two times a day (BID) | ORAL | 3 refills | Status: DC

## 2016-11-09 NOTE — Progress Notes (Signed)
Call pt: Vitamin d is low. Please send vitamin D 50,000 units weekly for 8 weeks then recheck.  Iron stores are depleted. You are very anemic. Start ferrous sulfate 325mg  twice a day with food. Watch for constipation. Recheck in 8 weeks.

## 2016-12-04 ENCOUNTER — Emergency Department (INDEPENDENT_AMBULATORY_CARE_PROVIDER_SITE_OTHER)
Admission: EM | Admit: 2016-12-04 | Discharge: 2016-12-04 | Disposition: A | Discharge status: Home / Self Care | Payer: Managed Care, Other (non HMO) | Source: Home / Self Care | Attending: Family Medicine | Admitting: Family Medicine

## 2016-12-04 ENCOUNTER — Encounter: Payer: Self-pay | Admitting: Emergency Medicine

## 2016-12-04 DIAGNOSIS — J069 Acute upper respiratory infection, unspecified: Secondary | ICD-10-CM

## 2016-12-04 DIAGNOSIS — B9789 Other viral agents as the cause of diseases classified elsewhere: Secondary | ICD-10-CM

## 2016-12-04 MED ORDER — PREDNISONE 20 MG PO TABS: ORAL_TABLET | ORAL | 0 refills | Status: DC

## 2016-12-04 MED ORDER — BENZONATATE 200 MG PO CAPS: ORAL_CAPSULE | ORAL | 0 refills | Status: DC

## 2016-12-04 MED ORDER — DOXYCYCLINE HYCLATE 100 MG PO CAPS: 100.0000 mg | ORAL_CAPSULE | Freq: Two times a day (BID) | ORAL | 0 refills | Status: DC

## 2016-12-04 NOTE — ED Triage Notes (Signed)
Pt c/o sore throat since Monday, non-productive cough x 2 days, neck behind left ear painful, left ear painful, runny nose, some sneezing.

## 2016-12-04 NOTE — ED Provider Notes (Signed)
[facility]    CSN: [id number] Arrival date & time: 12/04/16  1712     History   Chief Complaint Chief Complaint  Patient presents with  . Otalgia    HPI Shirley Vasquez is a 51 y.o. female.   Patient developed a sore throat 5 days ago followed by sinus congestion.  She developed a cough and chills yesterday.  She has a history of recurrent otitis media with a left T tube in place, and yesterday developed left earache.   The history is provided by the patient.    Past Medical History:  Diagnosis Date  . Anxiety   . Asthma   . Fibroid    Uterine ablation  . GERD (gastroesophageal reflux disease)     Patient Active Problem List   Diagnosis Date Noted  . Low iron stores 11/09/2016  . IDA (iron deficiency anemia) 11/09/2016  . Vitamin D deficiency 05/04/2016  . Elevated LDL cholesterol level 05/03/2016  . 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 Impaired cognition 01/29/2016  . Right cervical radiculopathy 12/25/2015  . Dysfunction of eustachian tube 08/12/2015  . Morton's neuroma, right 3/4, left 2/3  03/13/2015  . Major depressive disorder, recurrent episode, moderate ([place]) 01/06/2015  . Stress headache 01/06/2015  . Bursitis, trochanteric 08/07/2014  . Low back pain 07/31/2014  . Greater trochanteric bursitis of both hips 04/01/2014  . Carpal tunnel syndrome 10/03/2013  . Asthma 10/18/2012  . Hyperlipidemia 06/12/2012  . GERD (gastroesophageal reflux disease) 03/15/2012  . Panic attacks 03/15/2012  . Anxiety 03/15/2012    Past Surgical History:  Procedure Laterality Date  . BLADDER SUSPENSION    . CHOLECYSTECTOMY    . TONSILLECTOMY AND ADENOIDECTOMY    . TUBAL LIGATION    . uternine ablasion    . WISDOM TOOTH EXTRACTION      OB History    No data available       Home Medications    Prior to Admission medications   Medication Sig Start Date End Date Taking? Authorizing Provider  albuterol (PROVENTIL HFA;VENTOLIN HFA) 108 (90  BASE) MCG/ACT inhaler Inhale 1-2 puffs into the lungs every 6 (six) hours as needed for wheezing or shortness of breath. 06/29/15   Noland Fordyce, PA-C  benzonatate (TESSALON) 200 MG capsule Take one cap by mouth at bedtime as needed for cough.  May repeat in 4 to 6 hours 12/04/16   Kandra Nicolas, MD  clonazePAM (KLONOPIN) 1 MG tablet TAKE 1 TABLET(1 MG TOTAL) BY MOUTH 2 TIMES DAILY AS NEEDED 11/01/16   Jade L Breeback, PA-C  doxycycline (VIBRAMYCIN) 100 MG capsule Take 1 capsule (100 mg total) by mouth 2 (two) times daily. Take with food. 12/04/16   Kandra Nicolas, MD  esomeprazole (NEXIUM) 40 MG capsule TAKE ONE CAPSULE BY MOUTH EVERY DAY AT NOON 05/03/16   Jade L Breeback, PA-C  esomeprazole (NEXIUM) 40 MG capsule TAKE 1 CAPSULE BY MOUTH EVERY DAY AT NOON 07/28/16   Jade L Breeback, PA-C  ferrous sulfate 325 (65 FE) MG tablet Take 1 tablet (325 mg total) by mouth 2 (two) times daily with a meal. 11/09/16   Jade L Breeback, PA-C  FLUoxetine (PROZAC) 20 MG tablet Take 3 tablets daily for mood. 11/01/16   Jade L Breeback, PA-C  Multiple Vitamin (MULTIVITAMIN) tablet Take 1 tablet by mouth daily.      Historical Provider, MD  predniSONE (DELTASONE) 20 MG tablet Take one tab by mouth twice daily for 5 days, then  one daily. Take with food. 12/04/16   Kandra Nicolas, MD  Vitamin D, Ergocalciferol, (DRISDOL) 50000 units CAPS capsule Take 1 capsule (50,000 Units total) by mouth every 7 (seven) days. 11/09/16   Donella Stade, PA-C    Family History Family History  Problem Relation Age of Onset  . Cancer Mother     breast  . Heart failure Mother   . Hypertension Mother   . Heart attack Mother     Social History Social History  Substance Use Topics  . Smoking status: Never Smoker  . Smokeless tobacco: Never Used  . Alcohol use No     Allergies   Keflex [cephalexin]; Levaquin [levofloxacin in d5w]; Amoxicillin; Augmentin [amoxicillin-pot clavulanate]; Buprenorphine hcl; Buspar [buspirone]; Codeine;  Hctz [hydrochlorothiazide]; Morphine and related; Sulfa antibiotics; Sulfacetamide sodium; and Wellbutrin [bupropion]   Review of Systems Review of Systems + sore throat + cough No pleuritic pain No wheezing + nasal congestion + post-nasal drainage + sinus pain/pressure No itchy/red eyes + left earache No hemoptysis No SOB No fever, + chills No nausea No vomiting No abdominal pain No diarrhea No urinary symptoms No skin rash + fatigue No myalgias + headache Used OTC meds without relief   Physical Exam Triage Vital Signs ED Triage Vitals  Enc Vitals Group     BP 12/04/16 1733 149/84     Pulse Rate 12/04/16 1733 87     Resp --      Temp 12/04/16 1733 98.4 F (36.9 C)     Temp Source 12/04/16 1733 Oral     SpO2 12/04/16 1733 96 %     Weight 12/04/16 1733 169 lb (76.7 kg)     Height 12/04/16 1733 5\' 3"  (1.6 m)     Head Circumference --      Peak Flow --      Pain Score 12/04/16 1736 7     Pain Loc --      Pain Edu? --      Excl. in [place]? --    No data found.   Updated Vital Signs BP 149/84 (BP Location: Left Arm)   Pulse 87   Temp 98.4 F (36.9 C) (Oral)   Ht 5\' 3"  (1.6 m)   Wt 169 lb (76.7 kg)   SpO2 96%   BMI 29.94 kg/m   Visual Acuity Right Eye Distance:   Left Eye Distance:   Bilateral Distance:    Right Eye Near:   Left Eye Near:    Bilateral Near:     Physical Exam Nursing notes and Vital Signs reviewed. Appearance:  Patient appears stated age, and in no acute distress Eyes:  Pupils are equal, round, and reactive to light and accomodation.  Extraocular movement is intact.  Conjunctivae are not inflamed  Ears:  Canals normal.  Right tympanic membrane normal.  Left tympanic membrane scarred with ventilation tube in place. Nose:  Congested turbinates.  Maxillary sinus tenderness is present.  Pharynx:  Normal Neck:  Supple.  Tender enlarged posterior/lateral nodes are palpated bilaterally  Lungs:  Clear to auscultation.  Breath sounds are  equal.  Moving air well. Heart:  Regular rate and rhythm without murmurs, rubs, or gallops.  Abdomen:  Nontender without masses or hepatosplenomegaly.  Bowel sounds are present.  No CVA or flank tenderness.  Extremities:  No edema.  Skin:  No rash present.    UC Treatments / Results  Labs (all labs ordered are listed, but only abnormal results are displayed) Labs Reviewed -  No data to display  EKG  EKG Interpretation None       Radiology No results found.  Procedures Procedures (including critical care time)  Medications Ordered in UC Medications - No data to display   Initial Impression / Assessment and Plan / UC Course  I have reviewed the triage vital signs and the nursing notes.  Pertinent labs & imaging results that were available during my care of the patient were reviewed by me and considered in my medical decision making (see chart for details).    Begin prednisone burst/taper.  Prescription written for Benzonatate [facility]) to take at bedtime for night-time cough.  Begin doxycycline for atypical coverage. Take plain guaifenesin (1200mg  extended release tabs such as Mucinex) twice daily, with plenty of water, for cough and congestion.  May add Pseudoephedrine (30mg , one or two every 4 to 6 hours) for sinus congestion.  Get adequate rest.   May use Afrin nasal spray (or generic oxymetazoline) twice daily for about 5 days and then discontinue.  Also recommend using saline nasal spray several times daily and saline nasal irrigation (AYR is a common brand).  Use Flonase nasal spray each morning after using Afrin nasal spray and saline nasal irrigation. Try warm salt water gargles for sore throat.  Stop all antihistamines for now, and other non-prescription cough/cold preparations. May continue albuterol inhaler as needed.   Follow-up with family doctor if not improving about10 days.     Final Clinical Impressions(s) / UC Diagnoses   Final diagnoses:  Viral URI  with cough    New Prescriptions New Prescriptions   BENZONATATE (TESSALON) 200 MG CAPSULE    Take one cap by mouth at bedtime as needed for cough.  May repeat in 4 to 6 hours   DOXYCYCLINE (VIBRAMYCIN) 100 MG CAPSULE    Take 1 capsule (100 mg total) by mouth 2 (two) times daily. Take with food.   PREDNISONE (DELTASONE) 20 MG TABLET    Take one tab by mouth twice daily for 5 days, then one daily. Take with food.     Kandra Nicolas, MD 12/07/16 1029

## 2016-12-04 NOTE — Discharge Instructions (Signed)
Take plain guaifenesin (1200mg  extended release tabs such as Mucinex) twice daily, with plenty of water, for cough and congestion.  May add Pseudoephedrine (30mg , one or two every 4 to 6 hours) for sinus congestion.  Get adequate rest.   May use Afrin nasal spray (or generic oxymetazoline) twice daily for about 5 days and then discontinue.  Also recommend using saline nasal spray several times daily and saline nasal irrigation (AYR is a common brand).  Use Flonase nasal spray each morning after using Afrin nasal spray and saline nasal irrigation. Try warm salt water gargles for sore throat.  Stop all antihistamines for now, and other non-prescription cough/cold preparations. May continue albuterol inhaler as needed.   Follow-up with family doctor if not improving about10 days.

## 2016-12-07 ENCOUNTER — Ambulatory Visit: Payer: Managed Care, Other (non HMO) | Admitting: Physician Assistant

## 2016-12-14 ENCOUNTER — Ambulatory Visit: Payer: Managed Care, Other (non HMO) | Admitting: Physician Assistant

## 2016-12-15 ENCOUNTER — Ambulatory Visit (INDEPENDENT_AMBULATORY_CARE_PROVIDER_SITE_OTHER): Payer: Managed Care, Other (non HMO) | Admitting: Family Medicine

## 2016-12-15 VITALS — BP 136/71 | HR 91 | Wt 173.0 lb

## 2016-12-15 DIAGNOSIS — J0101 Acute recurrent maxillary sinusitis: Secondary | ICD-10-CM | POA: Diagnosis not present

## 2016-12-15 MED ORDER — AZITHROMYCIN 250 MG PO TABS: 250.0000 mg | ORAL_TABLET | Freq: Every day | ORAL | 0 refills | Status: DC

## 2016-12-15 MED ORDER — IPRATROPIUM BROMIDE 0.06 % NA SOLN: 2.0000 | NASAL | 6 refills | Status: DC | PRN

## 2016-12-15 MED ORDER — HYDROCODONE-HOMATROPINE 5-1.5 MG/5ML PO SYRP: 5.0000 mL | ORAL_SOLUTION | Freq: Three times a day (TID) | ORAL | 0 refills | Status: DC | PRN

## 2016-12-15 NOTE — Patient Instructions (Signed)
Thank you for coming in today. Take azithromycin daily for 5 days.  Use the nasal spray I prescribed as needed.  Use the cough medicine at bedtime as needed.  I also recommend use an over the counter allergy medicine like zyrtec (certizine) or allegra or Claritin.   Recheck as needed.    Sinusitis, Adult Sinusitis is soreness and inflammation of your sinuses. Sinuses are hollow spaces in the bones around your face. They are located:  Around your eyes.  In the middle of your forehead.  Behind your nose.  In your cheekbones. Your sinuses and nasal passages are lined with a stringy fluid (mucus). Mucus normally drains out of your sinuses. When your nasal tissues get inflamed or swollen, the mucus can get trapped or blocked so air cannot flow through your sinuses. This lets bacteria, viruses, and funguses grow, and that leads to infection. Follow these instructions at home: Medicines   Take, use, or apply over-the-counter and prescription medicines only as told by your doctor. These may include nasal sprays.  If you were prescribed an antibiotic medicine, take it as told by your doctor. Do not stop taking the antibiotic even if you start to feel better. Hydrate and Humidify   Drink enough water to keep your pee (urine) clear or pale yellow.  Use a cool mist humidifier to keep the humidity level in your home above 50%.  Breathe in steam for 10-15 minutes, 3-4 times a day or as told by your doctor. You can do this in the bathroom while a hot shower is running.  Try not to spend time in cool or dry air. Rest   Rest as much as possible.  Sleep with your head raised (elevated).  Make sure to get enough sleep each night. General instructions   Put a warm, moist washcloth on your face 3-4 times a day or as told by your doctor. This will help with discomfort.  Wash your hands often with soap and water. If there is no soap and water, use hand sanitizer.  Do not smoke. Avoid being  around people who are smoking (secondhand smoke).  Keep all follow-up visits as told by your doctor. This is important. Contact a doctor if:  You have a fever.  Your symptoms get worse.  Your symptoms do not get better within 10 days. Get help right away if:  You have a very bad headache.  You cannot stop throwing up (vomiting).  You have pain or swelling around your face or eyes.  You have trouble seeing.  You feel confused.  Your neck is stiff.  You have trouble breathing. This information is not intended to replace advice given to you by your health care provider. Make sure you discuss any questions you have with your health care provider. Document Released: 03/08/2008 Document Revised: 05/16/2016 Document Reviewed: 07/16/2015 Elsevier Interactive Patient Education  2017 [employer].

## 2016-12-15 NOTE — Progress Notes (Signed)
Shirley Vasquez is a 51 y.o. female who presents to [place]: [facility] today for left facial pain and pressure and left ear pain. Patient was seen recently in the urgent care where she was diagnosed with bronchitis or sinusitis and treated with prednisone and doxycycline and felt somewhat better. She's run out of medicines and that symptoms returning. She denies fevers or chills vomiting or diarrhea feels well otherwise. She notes left-sided facial pain and pressure. She denies any discharge. She has a pertinent past medical history for left tympanic membrane tube placement.   Past Medical History:  Diagnosis Date  . Anxiety   . Asthma   . Fibroid    Uterine ablation  . GERD (gastroesophageal reflux disease)    Past Surgical History:  Procedure Laterality Date  . BLADDER SUSPENSION    . CHOLECYSTECTOMY    . TONSILLECTOMY AND ADENOIDECTOMY    . TUBAL LIGATION    . uternine ablasion    . WISDOM TOOTH EXTRACTION     Social History  Substance Use Topics  . Smoking status: Never Smoker  . Smokeless tobacco: Never Used  . Alcohol use No   family history includes Cancer in her mother; Heart attack in her mother; Heart failure in her mother; Hypertension in her mother.  ROS as above:  Medications: Current Outpatient Prescriptions  Medication Sig Dispense Refill  . albuterol (PROVENTIL HFA;VENTOLIN HFA) 108 (90 BASE) MCG/ACT inhaler Inhale 1-2 puffs into the lungs every 6 (six) hours as needed for wheezing or shortness of breath. 1 Inhaler 0  . clonazePAM (KLONOPIN) 1 MG tablet TAKE 1 TABLET(1 MG TOTAL) BY MOUTH 2 TIMES DAILY AS NEEDED 60 tablet 5  . esomeprazole (NEXIUM) 40 MG capsule TAKE ONE CAPSULE BY MOUTH EVERY DAY AT NOON 30 capsule 11  . ferrous sulfate 325 (65 FE) MG tablet Take 1 tablet (325 mg total) by mouth 2 (two) times daily with a meal. 60 tablet 3  .  FLUoxetine (PROZAC) 20 MG tablet Take 3 tablets daily for mood. 90 tablet 1  . Multiple Vitamin (MULTIVITAMIN) tablet Take 1 tablet by mouth daily.      . Vitamin D, Ergocalciferol, (DRISDOL) 50000 units CAPS capsule Take 1 capsule (50,000 Units total) by mouth every 7 (seven) days. 8 capsule 0  . azithromycin (ZITHROMAX) 250 MG tablet Take 1 tablet (250 mg total) by mouth daily. Take first 2 tablets together, then 1 every day until finished. 6 tablet 0  . HYDROcodone-homatropine (HYCODAN) 5-1.5 MG/5ML syrup Take 5 mLs by mouth every 8 (eight) hours as needed for cough. 120 mL 0  . ipratropium (ATROVENT) 0.06 % nasal spray Place 2 sprays into both nostrils every 4 (four) hours as needed for rhinitis. 10 mL 6   No current facility-administered medications for this visit.    Allergies  Allergen Reactions  . Keflex [Cephalexin]   . Levaquin [Levofloxacin In D5w]   . Amoxicillin Hives  . Augmentin [Amoxicillin-Pot Clavulanate] Other (See Comments)    Irritates upper and lower GI system  . Buprenorphine Hcl Other (See Comments)    Over-sedates; hard to breathe well in PACU after surgery  . Buspar [Buspirone] Other (See Comments)    headache  . Codeine Nausea And Vomiting  . Hctz [Hydrochlorothiazide] Hives  . Morphine And Related Other (See Comments)    Over-sedates; hard to breathe well in PACU after surgery  . Sulfa Antibiotics Hives  . Sulfacetamide Sodium Hives  .  Wellbutrin [Bupropion] Rash    Health Maintenance Health Maintenance  Topic Date Due  . COLONOSCOPY  10/02/2015  . PAP SMEAR  10/04/2016  . HIV Screening  05/03/2017 (Originally 10/01/1980)  . MAMMOGRAM  08/16/2017  . TETANUS/TDAP  04/03/2021  . INFLUENZA VACCINE  Addressed     Exam:  BP 136/71   Pulse 91   Wt 173 lb (78.5 kg)   BMI 30.65 kg/m  Gen: Well NAD HEENT: EOMI,  MMM Tender palpation left maxillary and frontal sinuses. Posterior pharynx with mild cobblestoning. Mild inflamed nasal turbinates  bilaterally. Left tympanic membrane with intact tubes without discharge. Right tympanic membranes normal. Mildly tender to palpation left lateral cervical area with no significant lymphadenopathy. Lungs: Normal work of breathing. CTABL Heart: RRR no MRG Abd: NABS, Soft. Nondistended, Nontender Exts: Brisk capillary refill, warm and well perfused.    No results found for this or any previous visit (from the past 72 hour(s)). No results found.    Assessment and Plan: 51 y.o. female with sinusitis likely with second sickening. Will switch to treat with antibiotics. We'll use azithromycin as patient has multiple drug allergies and was recently prescribed doxycycline. Avoid prednisone if possible. Use Atrovent nasal spray.   No orders of the defined types were placed in this encounter.  Meds ordered this encounter  Medications  . azithromycin (ZITHROMAX) 250 MG tablet    Sig: Take 1 tablet (250 mg total) by mouth daily. Take first 2 tablets together, then 1 every day until finished.    Dispense:  6 tablet    Refill:  0  . ipratropium (ATROVENT) 0.06 % nasal spray    Sig: Place 2 sprays into both nostrils every 4 (four) hours as needed for rhinitis.    Dispense:  10 mL    Refill:  6  . HYDROcodone-homatropine (HYCODAN) 5-1.5 MG/5ML syrup    Sig: Take 5 mLs by mouth every 8 (eight) hours as needed for cough.    Dispense:  120 mL    Refill:  0     Discussed warning signs or symptoms. Please see discharge instructions. Patient expresses understanding.

## 2016-12-21 ENCOUNTER — Ambulatory Visit (INDEPENDENT_AMBULATORY_CARE_PROVIDER_SITE_OTHER): Payer: Managed Care, Other (non HMO) | Admitting: Physician Assistant

## 2016-12-21 ENCOUNTER — Encounter: Payer: Self-pay | Admitting: Physician Assistant

## 2016-12-21 VITALS — BP 105/68 | HR 90 | Ht 63.0 in | Wt 172.0 lb

## 2016-12-21 DIAGNOSIS — F410 Panic disorder [episodic paroxysmal anxiety] without agoraphobia: Secondary | ICD-10-CM | POA: Diagnosis not present

## 2016-12-21 DIAGNOSIS — D509 Iron deficiency anemia, unspecified: Secondary | ICD-10-CM

## 2016-12-21 DIAGNOSIS — F331 Major depressive disorder, recurrent, moderate: Secondary | ICD-10-CM | POA: Diagnosis not present

## 2016-12-21 DIAGNOSIS — E559 Vitamin D deficiency, unspecified: Secondary | ICD-10-CM | POA: Diagnosis not present

## 2016-12-21 DIAGNOSIS — F419 Anxiety disorder, unspecified: Secondary | ICD-10-CM | POA: Diagnosis not present

## 2016-12-21 DIAGNOSIS — R4789 Other speech disturbances: Secondary | ICD-10-CM

## 2016-12-21 DIAGNOSIS — R4189 Other symptoms and signs involving cognitive functions and awareness: Secondary | ICD-10-CM

## 2016-12-21 MED ORDER — CYCLOBENZAPRINE HCL 10 MG PO TABS: 10.0000 mg | ORAL_TABLET | Freq: Three times a day (TID) | ORAL | 0 refills | Status: DC | PRN

## 2016-12-21 MED ORDER — FLUOXETINE HCL 20 MG PO TABS: ORAL_TABLET | ORAL | 5 refills | Status: DC

## 2016-12-21 NOTE — Patient Instructions (Signed)
Cervical Strain and Sprain Rehab Ask your health care provider which exercises are safe for you. Do exercises exactly as told by your health care provider and adjust them as directed. It is normal to feel mild stretching, pulling, tightness, or discomfort as you do these exercises, but you should stop right away if you feel sudden pain or your pain gets worse.Do not begin these exercises until told by your health care provider. Stretching and range of motion exercises These exercises warm up your muscles and joints and improve the movement and flexibility of your neck. These exercises also help to relieve pain, numbness, and tingling. Exercise A: Cervical side bend 1. Using good posture, sit on a stable chair or stand up. 2. Without moving your shoulders, slowly tilt your left / right ear to your shoulder until you feel a stretch in your neck muscles. You should be looking straight ahead. 3. Hold for __________ seconds. 4. Repeat with the other side of your neck. Repeat __________ times. Complete this exercise __________ times a day. Exercise B: Cervical rotation 1. Using good posture, sit on a stable chair or stand up. 2. Slowly turn your head to the side as if you are looking over your left / right shoulder.  Keep your eyes level with the ground.  Stop when you feel a stretch along the side and the back of your neck. 3. Hold for __________ seconds. 4. Repeat this by turning to your other side. Repeat __________ times. Complete this exercise __________ times a day. Exercise C: Thoracic extension and pectoral stretch 1. Roll a towel or a small blanket so it is about 4 inches (10 cm) in diameter. 2. Lie down on your back on a firm surface. 3. Put the towel lengthwise, under your spine in the middle of your back. It should not be not under your shoulder blades. The towel should line up with your spine from your middle back to your lower back. 4. Put your hands behind your head and let your  elbows fall out to your sides. 5. Hold for __________ seconds. Repeat __________ times. Complete this exercise __________ times a day. Strengthening exercises These exercises build strength and endurance in your neck. Endurance is the ability to use your muscles for a long time, even after your muscles get tired. Exercise D: Upper cervical flexion, isometric 1. Lie on your back with a thin pillow behind your head and a small rolled-up towel under your neck. 2. Gently tuck your chin toward your chest and nod your head down to look toward your feet. Do not lift your head off the pillow. 3. Hold for __________ seconds. 4. Release the tension slowly. Relax your neck muscles completely before you repeat this exercise. Repeat __________ times. Complete this exercise __________ times a day. Exercise E: Cervical extension, isometric 1. Stand about 6 inches (15 cm) away from a wall, with your back facing the wall. 2. Place a soft object, about 6-8 inches (15-20 cm) in diameter, between the back of your head and the wall. A soft object could be a small pillow, a ball, or a folded towel. 3. Gently tilt your head back and press into the soft object. Keep your jaw and forehead relaxed. 4. Hold for __________ seconds. 5. Release the tension slowly. Relax your neck muscles completely before you repeat this exercise. Repeat __________ times. Complete this exercise __________ times a day. Posture and body mechanics   Body mechanics refers to the movements and positions of your body   while you do your daily activities. Posture is part of body mechanics. Good posture and healthy body mechanics can help to relieve stress in your body's tissues and joints. Good posture means that your spine is in its natural S-curve position (your spine is neutral), your shoulders are pulled back slightly, and your head is not tipped forward. The following are general guidelines for applying improved posture and body mechanics to your  everyday activities. Standing  When standing, keep your spine neutral and keep your feet about hip-width apart. Keep a slight bend in your knees. Your ears, shoulders, and hips should line up.  When you do a task in which you stand in one place for a long time, place one foot up on a stable object that is 2-4 inches (5-10 cm) high, such as a footstool. This helps keep your spine neutral. Sitting  When sitting, keep your spine neutral and your keep feet flat on the floor. Use a footrest, if necessary, and keep your thighs parallel to the floor. Avoid rounding your shoulders, and avoid tilting your head forward.  When working at a desk or a computer, keep your desk at a height where your hands are slightly lower than your elbows. Slide your chair under your desk so you are close enough to maintain good posture.  When working at a computer, place your monitor at a height where you are looking straight ahead and you do not have to tilt your head forward or downward to look at the screen. Resting When lying down and resting, avoid positions that are most painful for you. Try to support your neck in a neutral position. You can use a contour pillow or a small rolled-up towel. Your pillow should support your neck but not push on it. This information is not intended to replace advice given to you by your health care provider. Make sure you discuss any questions you have with your health care provider. Document Released: 09/20/2005 Document Revised: 05/27/2016 Document Reviewed: 08/27/2015 Elsevier Interactive Patient Education  2017 [employer]  

## 2016-12-21 NOTE — Progress Notes (Signed)
   Subjective:    Patient ID: Shirley Vasquez, female    DOB: [birth year], 51 y.o.   MRN: [id number]  HPI  Pt is a 51 yo female who presents to the clinic to follow up on MDD. Pt is doing much better. She is in counseling 1 hour a week and seems to be helping a lot. We increased prozac to 3 tablets a day and that has helped as well. She feels herself improving and becoming more motivated and happy with the weather changing into spring. She denies any suicidal thoughts.     Review of Systems  All other systems reviewed and are negative.      Objective:   Physical Exam  Constitutional: She is oriented to person, place, and time. She appears well-developed and well-nourished.  HENT:  Head: Normocephalic and atraumatic.  Neck: Normal range of motion. Neck supple. No thyromegaly present.  Cardiovascular: Normal rate, regular rhythm and normal heart sounds.   Pulmonary/Chest: Effort normal and breath sounds normal. She has no wheezes.  Lymphadenopathy:    She has no cervical adenopathy.  Neurological: She is alert and oriented to person, place, and time.  Psychiatric: She has a normal mood and affect. Her behavior is normal.          Assessment & Plan:  Marland KitchenMarland KitchenDiagnoses and all orders for this visit:  Major depressive disorder, recurrent episode, moderate (HCC) -     BASIC METABOLIC PANEL WITH GFR  Panic attacks -     BASIC METABOLIC PANEL WITH GFR  Iron deficiency anemia, unspecified iron deficiency anemia type -     CBC with Differential/Platelet -     Ferritin -     BASIC METABOLIC PANEL WITH GFR  Vitamin D deficiency -     VITAMIN D 25 Hydroxy (Vit-D Deficiency, Fractures) -     BASIC METABOLIC PANEL WITH GFR  Anxiety -     FLUoxetine (PROZAC) 20 MG tablet; Take 3 tablets daily for mood.  Word finding difficulty -     FLUoxetine (PROZAC) 20 MG tablet; Take 3 tablets daily for mood.  Impaired cognition -     FLUoxetine (PROZAC) 20 MG tablet; Take 3 tablets daily for  mood.  Other orders -     cyclobenzaprine (FLEXERIL) 10 MG tablet; Take 1 tablet (10 mg total) by mouth 3 (three) times daily as needed for muscle spasms.   .. Depression screen [facility] 2/9 12/21/2016 09/05/2013  Decreased Interest 0 0  Down, Depressed, Hopeless 0 0  PHQ - 2 Score 0 0  Altered sleeping 0 -  Tired, decreased energy 0 -  Change in appetite 0 -  Feeling bad or failure about yourself  0 -  Trouble concentrating 0 -  Moving slowly or fidgety/restless 1 -  Suicidal thoughts 0 -  PHQ-9 Score 1 -   She is doing so much better. Continue with counseling. Consider in next month going back to prozac 2 tablets instead of the 3.   In next few weeks recheck labs from vitamin D and IDA to make sure everything is coming up.   Follow up in 6 months.

## 2016-12-22 DIAGNOSIS — R4789 Other speech disturbances: Secondary | ICD-10-CM | POA: Insufficient documentation

## 2017-01-01 ENCOUNTER — Encounter: Payer: Self-pay | Admitting: Emergency Medicine

## 2017-01-01 ENCOUNTER — Emergency Department (INDEPENDENT_AMBULATORY_CARE_PROVIDER_SITE_OTHER)
Admission: EM | Admit: 2017-01-01 | Discharge: 2017-01-01 | Disposition: A | Discharge status: Home / Self Care | Payer: Managed Care, Other (non HMO) | Source: Home / Self Care | Attending: Family Medicine | Admitting: Family Medicine

## 2017-01-01 DIAGNOSIS — H60502 Unspecified acute noninfective otitis externa, left ear: Secondary | ICD-10-CM

## 2017-01-01 DIAGNOSIS — H6692 Otitis media, unspecified, left ear: Secondary | ICD-10-CM | POA: Diagnosis not present

## 2017-01-01 MED ORDER — CIPROFLOXACIN-HYDROCORTISONE 0.2-1 % OT SUSP: 3.0000 [drp] | Freq: Two times a day (BID) | OTIC | 0 refills | Status: DC

## 2017-01-01 NOTE — Discharge Instructions (Signed)
Continue daily Flonase nasal spray and saline irrigation.
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
## 2017-01-01 NOTE — ED Triage Notes (Signed)
Yesterday onset of left ear pain, headache, some congestion and left neck pain. Took ibuprofen yesterday.

## 2017-02-03 LAB — CBC WITH DIFFERENTIAL/PLATELET
BASOS ABS: 42 {cells}/uL (ref 0–200)
Basophils Relative: 1 %
Eosinophils Absolute: 126 cells/uL (ref 15–500)
Eosinophils Relative: 3 %
HCT: 35.2 % (ref 35.0–45.0)
HEMOGLOBIN: 11.7 g/dL (ref 11.7–15.5)
Lymphocytes Relative: 35 %
Lymphs Abs: 1470 cells/uL (ref 850–3900)
MCH: 28.3 pg (ref 27.0–33.0)
MCHC: 33.2 g/dL (ref 32.0–36.0)
MCV: 85.2 fL (ref 80.0–100.0)
MPV: 10.2 fL (ref 7.5–12.5)
Monocytes Absolute: 336 cells/uL (ref 200–950)
Monocytes Relative: 8 %
NEUTROS PCT: 53 %
Neutro Abs: 2226 cells/uL (ref 1500–7800)
Platelets: 279 10*3/uL (ref 140–400)
RBC: 4.13 MIL/uL (ref 3.80–5.10)
RDW: 16.9 % — ABNORMAL HIGH (ref 11.0–15.0)
WBC: 4.2 10*3/uL (ref 3.8–10.8)

## 2017-02-03 LAB — BASIC METABOLIC PANEL WITH GFR
BUN: 14 mg/dL (ref 7–25)
CO2: 24 mmol/L (ref 20–31)
Calcium: 8.7 mg/dL (ref 8.6–10.4)
Chloride: 108 mmol/L (ref 98–110)
Creat: 0.73 mg/dL (ref 0.50–1.05)
GLUCOSE: 90 mg/dL (ref 65–99)
POTASSIUM: 4.4 mmol/L (ref 3.5–5.3)
Sodium: 140 mmol/L (ref 135–146)

## 2017-02-04 LAB — FERRITIN: Ferritin: 8 ng/mL — ABNORMAL LOW (ref 10–232)

## 2017-02-04 LAB — VITAMIN D 25 HYDROXY (VIT D DEFICIENCY, FRACTURES): Vit D, 25-Hydroxy: 28 ng/mL — ABNORMAL LOW (ref 30–100)

## 2017-02-04 MED ORDER — FERROUS SULFATE 325 (65 FE) MG PO TABS: 325.0000 mg | ORAL_TABLET | Freq: Two times a day (BID) | ORAL | 4 refills | Status: DC

## 2017-02-04 MED ORDER — VITAMIN D (ERGOCALCIFEROL) 1.25 MG (50000 UNIT) PO CAPS: 50000.0000 [IU] | ORAL_CAPSULE | ORAL | 0 refills | Status: DC

## 2017-02-04 NOTE — Progress Notes (Signed)
Sent both after finish vitamin d maintain on 2000 IU daily.

## 2017-02-04 NOTE — Addendum Note (Signed)
Addended by: Donella Stade on: 02/04/2017 01:41 PM   Modules accepted: Orders

## 2017-02-04 NOTE — Progress Notes (Signed)
Call pt:  Vitamin d is still low. But has improved. Are you still on 50,000 units right?  Iron stores just a tad better. Confirm what iron supplement you are taking?

## 2017-02-09 ENCOUNTER — Ambulatory Visit (INDEPENDENT_AMBULATORY_CARE_PROVIDER_SITE_OTHER): Payer: Managed Care, Other (non HMO) | Admitting: Physician Assistant

## 2017-02-09 ENCOUNTER — Encounter: Payer: Self-pay | Admitting: Physician Assistant

## 2017-02-09 VITALS — BP 130/76 | HR 85 | Ht 63.0 in | Wt 172.0 lb

## 2017-02-09 DIAGNOSIS — F331 Major depressive disorder, recurrent, moderate: Secondary | ICD-10-CM

## 2017-02-09 DIAGNOSIS — M542 Cervicalgia: Secondary | ICD-10-CM

## 2017-02-09 DIAGNOSIS — R29898 Other symptoms and signs involving the musculoskeletal system: Secondary | ICD-10-CM

## 2017-02-09 DIAGNOSIS — M5412 Radiculopathy, cervical region: Secondary | ICD-10-CM

## 2017-02-09 DIAGNOSIS — F419 Anxiety disorder, unspecified: Secondary | ICD-10-CM | POA: Diagnosis not present

## 2017-02-09 DIAGNOSIS — R790 Abnormal level of blood mineral: Secondary | ICD-10-CM | POA: Diagnosis not present

## 2017-02-09 DIAGNOSIS — R232 Flushing: Secondary | ICD-10-CM

## 2017-02-09 LAB — FOLLICLE STIMULATING HORMONE: FSH: 80.6 m[IU]/mL

## 2017-02-09 NOTE — Progress Notes (Signed)
   Subjective:    Patient ID: Shirley Vasquez, female    DOB: Jul 31, 1966, 51 y.o.   MRN: [id number]  HPI  Pt is a 51 yo female who presents to the clinic for follow up.   For her anxiety and depression she is doing great. At last visit we decreased to 2 tablets daily and she has remained stable. She is seeing a counselor once a week at [facility].   She was found to have low iron stores and vitamin d was low. She has started back on iron bid and weekly vitamin D. She has had no side effects.   She continues to see Dr. Lynann Bologna and [facility] for her right foot pain and left cervical radiculopathy. She is back in PT due to some pain in the left side of neck and decreased ROM. She wants my suggestion if she should try acupuncture.  She would like to know if she is in menopause. She has been having hot flashes for a while but she also gets hot with anxiety. She has had ablation so she does not bleed.      Review of Systems  All other systems reviewed and are negative.      Objective:   Physical Exam  Constitutional: She is oriented to person, place, and time. She appears well-developed and well-nourished.  HENT:  Head: Normocephalic and atraumatic.  Cardiovascular: Normal rate, regular rhythm and normal heart sounds.   Pulmonary/Chest: Effort normal and breath sounds normal.  Neurological: She is alert and oriented to person, place, and time.  Psychiatric: She has a normal mood and affect. Her behavior is normal.          Assessment & Plan:  Marland KitchenMarland KitchenDiagnoses and all orders for this visit:  Major depressive disorder, recurrent episode, moderate (HCC)  Hot flashes -     Follicle stimulating hormone  Right cervical radiculopathy  Anxiety  Low iron stores  Decreased ROM of neck  Neck pain   .Marland Kitchen Depression screen [facility] 2/9 02/09/2017 12/21/2016 09/05/2013  Decreased Interest 0 0 0  Down, Depressed, Hopeless 0 0 0  PHQ - 2 Score 0 0 0  Altered sleeping 0 0 -   Tired, decreased energy 0 0 -  Change in appetite 0 0 -  Feeling bad or failure about yourself  0 0 -  Trouble concentrating 0 0 -  Moving slowly or fidgety/restless 1 1 -  Suicidal thoughts 0 0 -  PHQ-9 Score 1 1 -   Will check iron and vitamin d after been on supplements for at least 2 months.   I have no problem with acupuncture. For neck pain and following advice of [facility].   Continue on 2 tablets of prozac doing well.

## 2017-02-09 NOTE — Progress Notes (Signed)
Confirmed menopause.

## 2017-02-25 ENCOUNTER — Other Ambulatory Visit: Payer: Self-pay | Admitting: Physician Assistant

## 2017-02-25 DIAGNOSIS — F419 Anxiety disorder, unspecified: Secondary | ICD-10-CM

## 2017-03-01 ENCOUNTER — Other Ambulatory Visit: Payer: Self-pay | Admitting: *Deleted

## 2017-03-01 ENCOUNTER — Other Ambulatory Visit: Payer: Self-pay | Admitting: Family Medicine

## 2017-03-01 DIAGNOSIS — M25471 Effusion, right ankle: Secondary | ICD-10-CM

## 2017-03-01 DIAGNOSIS — M25571 Pain in right ankle and joints of right foot: Secondary | ICD-10-CM

## 2017-03-01 DIAGNOSIS — F419 Anxiety disorder, unspecified: Secondary | ICD-10-CM

## 2017-03-01 MED ORDER — CLONAZEPAM 1 MG PO TABS: ORAL_TABLET | ORAL | 5 refills | Status: DC

## 2017-03-06 ENCOUNTER — Ambulatory Visit
Admission: RE | Admit: 2017-03-06 | Discharge: 2017-03-06 | Disposition: A | Discharge status: Home / Self Care | Payer: Managed Care, Other (non HMO) | Source: Ambulatory Visit | Attending: Family Medicine | Admitting: Family Medicine

## 2017-03-06 DIAGNOSIS — M25471 Effusion, right ankle: Secondary | ICD-10-CM

## 2017-03-06 DIAGNOSIS — M25571 Pain in right ankle and joints of right foot: Secondary | ICD-10-CM

## 2017-03-19 ENCOUNTER — Other Ambulatory Visit: Payer: Self-pay | Admitting: Physician Assistant

## 2017-04-15 ENCOUNTER — Telehealth: Payer: Self-pay | Admitting: *Deleted

## 2017-04-15 DIAGNOSIS — R790 Abnormal level of blood mineral: Secondary | ICD-10-CM

## 2017-04-15 DIAGNOSIS — E559 Vitamin D deficiency, unspecified: Secondary | ICD-10-CM

## 2017-04-15 NOTE — Telephone Encounter (Signed)
Lab ordered to recheck ferritin.

## 2017-04-15 NOTE — Telephone Encounter (Signed)
Labs ordered to recheck vid d levels.

## 2017-04-21 LAB — VITAMIN D 25 HYDROXY (VIT D DEFICIENCY, FRACTURES): VIT D 25 HYDROXY: 28 ng/mL — AB (ref 30–100)

## 2017-04-21 LAB — FERRITIN: FERRITIN: 10 ng/mL (ref 10–232)

## 2017-04-21 MED ORDER — VITAMIN D (ERGOCALCIFEROL) 1.25 MG (50000 UNIT) PO CAPS: 50000.0000 [IU] | ORAL_CAPSULE | ORAL | 0 refills | Status: DC

## 2017-04-21 NOTE — Addendum Note (Signed)
Addended by: Silverio Decamp on: 04/21/2017 03:38 PM   Modules accepted: Orders

## 2017-04-22 ENCOUNTER — Ambulatory Visit (INDEPENDENT_AMBULATORY_CARE_PROVIDER_SITE_OTHER): Payer: Managed Care, Other (non HMO) | Admitting: Physician Assistant

## 2017-04-22 ENCOUNTER — Encounter: Payer: Self-pay | Admitting: Physician Assistant

## 2017-04-22 ENCOUNTER — Ambulatory Visit: Payer: Managed Care, Other (non HMO) | Admitting: Family Medicine

## 2017-04-22 ENCOUNTER — Telehealth: Payer: Self-pay

## 2017-04-22 VITALS — BP 127/72 | HR 91 | Ht 63.0 in | Wt 177.0 lb

## 2017-04-22 DIAGNOSIS — B9689 Other specified bacterial agents as the cause of diseases classified elsewhere: Secondary | ICD-10-CM

## 2017-04-22 DIAGNOSIS — N3090 Cystitis, unspecified without hematuria: Secondary | ICD-10-CM

## 2017-04-22 DIAGNOSIS — Z881 Allergy status to other antibiotic agents status: Secondary | ICD-10-CM | POA: Insufficient documentation

## 2017-04-22 LAB — POCT URINALYSIS DIPSTICK
Bilirubin, UA: NEGATIVE
Blood, UA: NEGATIVE
Glucose, UA: NEGATIVE
Ketones, UA: NEGATIVE
LEUKOCYTES UA: NEGATIVE
NITRITE UA: NEGATIVE
PH UA: 7 (ref 5.0–8.0)
PROTEIN UA: NEGATIVE
Spec Grav, UA: 1.015 (ref 1.010–1.025)
Urobilinogen, UA: 0.2 E.U./dL

## 2017-04-22 MED ORDER — FOSFOMYCIN TROMETHAMINE 3 G PO PACK: 3.0000 g | PACK | Freq: Once | ORAL | 0 refills | Status: AC

## 2017-04-22 NOTE — Progress Notes (Signed)
HPI:                                                                Shirley Vasquez is a 51 y.o. female who presents to [facility] today for UTI  [facility] for acute cystitis. She was treated with Macrobid. Her urine culture grew Klebsiella >100,000, which had intermediate susceptibility. She is continuing to endorse dysuria and urgency. Denies fever, chills, abdominal pain, flank pain. Denies vaginal symptoms.   Past Medical History:  Diagnosis Date  . Anxiety   . Asthma   . Fibroid    Uterine ablation  . GERD (gastroesophageal reflux disease)    Past Surgical History:  Procedure Laterality Date  . BLADDER SUSPENSION    . CHOLECYSTECTOMY    . TONSILLECTOMY AND ADENOIDECTOMY    . TUBAL LIGATION    . uternine ablasion    . WISDOM TOOTH EXTRACTION     Social History  Substance Use Topics  . Smoking status: Never Smoker  . Smokeless tobacco: Never Used  . Alcohol use No   family history includes Cancer in her mother; Heart attack in her mother; Heart failure in her mother; Hypertension in her mother.  ROS: negative except as noted in the HPI  Medications: Current Outpatient Prescriptions  Medication Sig Dispense Refill  . albuterol (PROVENTIL HFA;VENTOLIN HFA) 108 (90 BASE) MCG/ACT inhaler Inhale 1-2 puffs into the lungs every 6 (six) hours as needed for wheezing or shortness of breath. 1 Inhaler 0  . clonazePAM (KLONOPIN) 1 MG tablet TAKE 1 TABLET(1 MG TOTAL) BY MOUTH 2 TIMES DAILY AS NEEDED 60 tablet 5  . cyclobenzaprine (FLEXERIL) 10 MG tablet Take 1 tablet (10 mg total) by mouth 3 (three) times daily as needed for muscle spasms. 30 tablet 0  . esomeprazole (NEXIUM) 40 MG capsule TAKE ONE CAPSULE BY MOUTH EVERY DAY AT NOON 30 capsule 11  . ferrous sulfate 325 (65 FE) MG tablet Take 1 tablet (325 mg total) by mouth 2 (two) times daily with a meal. 60 tablet 4  . FLUoxetine (PROZAC) 20 MG tablet  Take 3 tablets daily for mood. 90 tablet 5  . Multiple Vitamin (MULTIVITAMIN) tablet Take 1 tablet by mouth daily.      . Vitamin D, Ergocalciferol, (DRISDOL) 50000 units CAPS capsule Take 1 capsule (50,000 Units total) by mouth every 7 (seven) days. 8 capsule 0   No current facility-administered medications for this visit.    Allergies  Allergen Reactions  . Keflex [Cephalexin]   . Levaquin [Levofloxacin In D5w]   . Amoxicillin Hives  . Augmentin [Amoxicillin-Pot Clavulanate] Other (See Comments)    Irritates upper and lower GI system  . Buprenorphine Hcl Other (See Comments)    Over-sedates; hard to breathe well in PACU after surgery  . Buspar [Buspirone] Other (See Comments)    headache  . Codeine Nausea And Vomiting  . Hctz [Hydrochlorothiazide] Hives  . Morphine And Related Other (See Comments)    Over-sedates; hard to breathe well in PACU after surgery  . Sulfa Antibiotics Hives  . Sulfacetamide Sodium Hives  . Wellbutrin [Bupropion] Rash       Objective:  There were no vitals taken for this visit.  Gen: well-groomed, cooperative, not ill-appearing, no distress HEENT: normal conjunctiva, trachea midline Pulm: Normal work of breathing, normal phonation GI: abdomen soft, nondistended, nontender, no CVA tenderness Neuro: alert and oriented x 3, EOM's intact, no tremor MSK: extremities atraumatic, normal gait and station, no peripheral edema   Results for orders placed or performed in visit on 04/15/17 (from the past 72 hour(s))  Ferritin     Status: None   Collection Time: 04/20/17 11:13 AM  Result Value Ref Range   Ferritin 10 10 - 232 ng/mL   No results found.    Assessment and Plan: 51 y.o. female with   1. Klebsiella cystitis - POCT urinalysis dipstick negative today, but patient has been taking Macrobid, has documented Klebsiella growth on urine culture and is still symptomatic - fosfomycin (MONUROL) 3 g PACK; Take 3 g by mouth once.  Dispense: 3 g;  Refill: 0  2. Allergy to multiple antibiotics - fosfomycin (MONUROL) 3 g PACK; Take 3 g by mouth once.  Dispense: 3 g; Refill: 0 - instructed to take Benadryl and discontinue antibiotic if rash develops - educated on angioedema and anaphylaxis and instructed to go to the ED if this occurs  Patient education and anticipatory guidance given Patient agrees with treatment plan Follow-up as needed if symptoms worsen or fail to improve  Darlyne Russian PA-C

## 2017-04-22 NOTE — Telephone Encounter (Signed)
No problem. Please let patient know we have placed that order. If she has any questions or concerns she should contact [employer] at [phone] We also recommend she contact her insurance company to find out if she will have any out-of-pocket cost

## 2017-04-22 NOTE — Telephone Encounter (Signed)
PT would like to do the cologuard at this time.

## 2017-05-02 NOTE — Telephone Encounter (Signed)
Left update on Pt's VM. Callback provided for any questions.  

## 2017-05-03 NOTE — Telephone Encounter (Signed)
Pt has received the kit, and she will mail it back soon.

## 2017-05-04 ENCOUNTER — Other Ambulatory Visit: Payer: Self-pay | Admitting: Physician Assistant

## 2017-05-04 LAB — COLOGUARD: Cologuard: NEGATIVE

## 2017-05-20 ENCOUNTER — Encounter: Payer: Self-pay | Admitting: Physician Assistant

## 2017-05-21 ENCOUNTER — Other Ambulatory Visit: Payer: Self-pay | Admitting: Physician Assistant

## 2017-05-24 ENCOUNTER — Ambulatory Visit (INDEPENDENT_AMBULATORY_CARE_PROVIDER_SITE_OTHER): Payer: Managed Care, Other (non HMO) | Admitting: Physician Assistant

## 2017-05-24 ENCOUNTER — Encounter: Payer: Self-pay | Admitting: Physician Assistant

## 2017-05-24 VITALS — BP 123/75 | HR 83 | Temp 98.0°F | Ht 63.0 in | Wt 177.0 lb

## 2017-05-24 DIAGNOSIS — D509 Iron deficiency anemia, unspecified: Secondary | ICD-10-CM

## 2017-05-24 DIAGNOSIS — H9202 Otalgia, left ear: Secondary | ICD-10-CM

## 2017-05-24 DIAGNOSIS — J0100 Acute maxillary sinusitis, unspecified: Secondary | ICD-10-CM | POA: Diagnosis not present

## 2017-05-24 DIAGNOSIS — T887XXA Unspecified adverse effect of drug or medicament, initial encounter: Secondary | ICD-10-CM | POA: Diagnosis not present

## 2017-05-24 DIAGNOSIS — K2100 Gastro-esophageal reflux disease with esophagitis, without bleeding: Secondary | ICD-10-CM

## 2017-05-24 DIAGNOSIS — R197 Diarrhea, unspecified: Secondary | ICD-10-CM

## 2017-05-24 DIAGNOSIS — H6982 Other specified disorders of Eustachian tube, left ear: Secondary | ICD-10-CM

## 2017-05-24 DIAGNOSIS — T50905A Adverse effect of unspecified drugs, medicaments and biological substances, initial encounter: Secondary | ICD-10-CM

## 2017-05-24 MED ORDER — METHYLPREDNISOLONE SODIUM SUCC 125 MG IJ SOLR
125.0000 mg | Freq: Once | INTRAMUSCULAR | Status: AC
  Administered 2017-05-24: 125 mg via INTRAMUSCULAR

## 2017-05-24 MED ORDER — AZITHROMYCIN 250 MG PO TABS: ORAL_TABLET | ORAL | 0 refills | Status: DC

## 2017-05-24 NOTE — Progress Notes (Signed)
Subjective:    Patient ID: Shirley Vasquez, female    DOB: 03-05-1966, 51 y.o.   MRN: [id number]  HPI  Pt is a 51 yo female who presents to clinic with left ear pain, sinus pressure, facial pain, sinus drainage for 10 days. She has done her usual OTC fixes with mucinex and sudafed with no benefit. Last few morning she wakes up spitting drainage out. No fever, chills, cough.   She is also having worsening reflux and diarrhea. Almost all her stools are loose. Denies any melena or hematochezia. She feels like linked to iron supplement. When she doesn't take it symptoms get better. cologuard negative in aug 2018. From last labs she is not responding to oral iron.   .. Active Ambulatory Problems    Diagnosis Date Noted  . GERD (gastroesophageal reflux disease) 03/15/2012  . Panic attacks 03/15/2012  . Anxiety 03/15/2012  . Hyperlipidemia 06/12/2012  . Asthma 10/18/2012  . Carpal tunnel syndrome 10/03/2013  . Greater trochanteric bursitis of both hips 04/01/2014  . Low back pain 07/31/2014  . Major depressive disorder, recurrent episode, moderate ([place]) 01/06/2015  . Stress headache 01/06/2015  . Morton's neuroma, right 3/4, left 2/3  03/13/2015  . Right cervical radiculopathy 12/25/2015  . Impaired cognition 01/29/2016  . 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 Bursitis, trochanteric 08/07/2014  . Dysfunction of eustachian tube 08/12/2015  . Elevated LDL cholesterol level 05/03/2016  . Vitamin D insufficiency 05/04/2016  . Low iron stores 11/09/2016  . IDA (iron deficiency anemia) 11/09/2016  . Word finding difficulty 12/22/2016  . Decreased ROM of neck 02/09/2017  . Neck pain 02/09/2017  . Allergy to multiple antibiotics 04/22/2017  . Klebsiella cystitis 04/22/2017   Resolved Ambulatory Problems    Diagnosis Date Noted  . Neck muscle spasm 05/03/2013  . Poison ivy dermatitis 05/03/2013  . Greater trochanteric bursitis of right hip 07/01/2014  . Carpal tunnel syndrome,  right 03/13/2015   Past Medical History:  Diagnosis Date  . Anxiety   . Asthma   . Fibroid   . GERD (gastroesophageal reflux disease)      Review of Systems See HPI.     Objective:   Physical Exam  Constitutional: She is oriented to person, place, and time. She appears well-developed and well-nourished.  HENT:  Head: Normocephalic and atraumatic.  Right Ear: External ear normal.  Nose: Nose normal.  Mouth/Throat: Oropharynx is clear and moist. No oropharyngeal exudate.  Left TM tube in place some slightly erythema of TM with pain over tragus.  Tenderness over maxillary sinuses.   Eyes: Conjunctivae are normal.  Neck: Normal range of motion. Neck supple.  Cardiovascular: Normal rate, regular rhythm and normal heart sounds.   Pulmonary/Chest: Effort normal and breath sounds normal.  Abdominal: Soft. Bowel sounds are normal. She exhibits no distension and no mass. There is no tenderness. There is no rebound and no guarding.  Neurological: She is alert and oriented to person, place, and time.  Psychiatric: She has a normal mood and affect. Her behavior is normal.          Assessment & Plan:  Marland KitchenMarland KitchenAimee was seen today for ear pain and medication problem.  Diagnoses and all orders for this visit:  Acute non-recurrent maxillary sinusitis -     azithromycin (ZITHROMAX) 250 MG tablet; Take 2 tablets now and then one tablet for 4 days. -     methylPREDNISolone sodium succinate (SOLU-MEDROL) 125 mg/2 mL injection 125 mg; Inject 2 mLs (125  mg total) into the muscle once.  Left ear pain -     methylPREDNISolone sodium succinate (SOLU-MEDROL) 125 mg/2 mL injection 125 mg; Inject 2 mLs (125 mg total) into the muscle once.  Dysfunction of left eustachian tube -     methylPREDNISolone sodium succinate (SOLU-MEDROL) 125 mg/2 mL injection 125 mg; Inject 2 mLs (125 mg total) into the muscle once.  Medication reaction, initial encounter  Iron deficiency anemia, unspecified iron  deficiency anemia type -     Ambulatory referral to Gastroenterology  Gastroesophageal reflux disease with esophagitis -     Ambulatory referral to Gastroenterology  Diarrhea, unspecified type -     Ambulatory referral to Gastroenterology   Pt is not responding to oral iron. She also feels like it is making her stools more loose and frequent. She had cologuard in august which was negative. She does report her GERD has been worse. Will send to [facility] for evaluation with possible upper endoscopy.

## 2017-05-24 NOTE — Patient Instructions (Signed)
Fusion plus to consider if may tolerate better.

## 2017-05-25 ENCOUNTER — Encounter: Payer: Self-pay | Admitting: Physician Assistant

## 2017-06-17 ENCOUNTER — Other Ambulatory Visit: Payer: Self-pay | Admitting: Sports Medicine

## 2017-06-24 ENCOUNTER — Ambulatory Visit: Payer: Managed Care, Other (non HMO) | Admitting: Physician Assistant

## 2017-06-27 ENCOUNTER — Ambulatory Visit: Payer: Managed Care, Other (non HMO) | Admitting: Physician Assistant

## 2017-07-26 ENCOUNTER — Other Ambulatory Visit: Payer: Self-pay | Admitting: Physician Assistant

## 2017-07-27 ENCOUNTER — Ambulatory Visit: Payer: Managed Care, Other (non HMO) | Admitting: Physician Assistant

## 2017-07-28 ENCOUNTER — Other Ambulatory Visit: Payer: Self-pay | Admitting: *Deleted

## 2017-07-29 ENCOUNTER — Encounter: Payer: Self-pay | Admitting: Physician Assistant

## 2017-07-29 ENCOUNTER — Ambulatory Visit (INDEPENDENT_AMBULATORY_CARE_PROVIDER_SITE_OTHER): Payer: 59 | Admitting: Physician Assistant

## 2017-07-29 ENCOUNTER — Other Ambulatory Visit: Payer: Self-pay | Admitting: Physician Assistant

## 2017-07-29 VITALS — BP 139/61 | HR 86 | Wt 175.0 lb

## 2017-07-29 DIAGNOSIS — S0990XS Unspecified injury of head, sequela: Secondary | ICD-10-CM

## 2017-07-29 DIAGNOSIS — R4789 Other speech disturbances: Secondary | ICD-10-CM

## 2017-07-29 DIAGNOSIS — F331 Major depressive disorder, recurrent, moderate: Secondary | ICD-10-CM

## 2017-07-29 DIAGNOSIS — R413 Other amnesia: Secondary | ICD-10-CM

## 2017-07-29 DIAGNOSIS — Z818 Family history of other mental and behavioral disorders: Secondary | ICD-10-CM | POA: Diagnosis not present

## 2017-07-29 DIAGNOSIS — F419 Anxiety disorder, unspecified: Secondary | ICD-10-CM | POA: Diagnosis not present

## 2017-07-29 DIAGNOSIS — Z1231 Encounter for screening mammogram for malignant neoplasm of breast: Secondary | ICD-10-CM

## 2017-07-29 MED ORDER — FLUOXETINE HCL 20 MG PO TABS: ORAL_TABLET | ORAL | 5 refills | Status: DC
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
## 2017-07-29 NOTE — Patient Instructions (Signed)
Come back for mental status exame 37minutes.

## 2017-07-31 ENCOUNTER — Encounter: Payer: Self-pay | Admitting: Physician Assistant

## 2017-07-31 DIAGNOSIS — R413 Other amnesia: Secondary | ICD-10-CM | POA: Insufficient documentation

## 2017-07-31 DIAGNOSIS — S0990XA Unspecified injury of head, initial encounter: Secondary | ICD-10-CM | POA: Insufficient documentation

## 2017-07-31 DIAGNOSIS — Z818 Family history of other mental and behavioral disorders: Secondary | ICD-10-CM | POA: Insufficient documentation

## 2017-08-01 DIAGNOSIS — R413 Other amnesia: Secondary | ICD-10-CM | POA: Insufficient documentation

## 2017-08-15 NOTE — Progress Notes (Signed)
Hgb dropped to 10.5 from 11.7. Are you taking oral iron?

## 2017-08-16 LAB — CBC
HCT: 32.6 % — ABNORMAL LOW (ref 35.0–45.0)
HEMOGLOBIN: 10.5 g/dL — AB (ref 11.7–15.5)
MCH: 26.5 pg — ABNORMAL LOW (ref 27.0–33.0)
MCHC: 32.2 g/dL (ref 32.0–36.0)
MCV: 82.3 fL (ref 80.0–100.0)
MPV: 10.7 fL (ref 7.5–12.5)
Platelets: 281 10*3/uL (ref 140–400)
RBC: 3.96 10*6/uL (ref 3.80–5.10)
RDW: 13.4 % (ref 11.0–15.0)
WBC: 4.8 10*3/uL (ref 3.8–10.8)

## 2017-08-16 LAB — RPR: RPR: NONREACTIVE

## 2017-08-16 LAB — FOLATE: Folate: 16.5 ng/mL

## 2017-08-16 LAB — VITAMIN B12: Vitamin B-12: 325 pg/mL (ref 200–1100)

## 2017-08-16 LAB — VITAMIN D 25 HYDROXY (VIT D DEFICIENCY, FRACTURES): VIT D 25 HYDROXY: 20 ng/mL — AB (ref 30–100)

## 2017-08-16 LAB — SEDIMENTATION RATE: Sed Rate: 33 mm/h — ABNORMAL HIGH (ref 0–30)

## 2017-08-16 LAB — TSH: TSH: 1.46 mIU/L

## 2017-08-16 LAB — HIV ANTIBODY (ROUTINE TESTING W REFLEX): HIV 1&2 Ab, 4th Generation: NONREACTIVE

## 2017-08-17 ENCOUNTER — Ambulatory Visit (INDEPENDENT_AMBULATORY_CARE_PROVIDER_SITE_OTHER): Payer: 59 | Admitting: Physician Assistant

## 2017-08-17 ENCOUNTER — Encounter: Payer: Self-pay | Admitting: Physician Assistant

## 2017-08-17 VITALS — BP 137/57 | HR 82 | Ht 62.99 in | Wt 177.0 lb

## 2017-08-17 DIAGNOSIS — F331 Major depressive disorder, recurrent, moderate: Secondary | ICD-10-CM

## 2017-08-17 DIAGNOSIS — E559 Vitamin D deficiency, unspecified: Secondary | ICD-10-CM | POA: Insufficient documentation

## 2017-08-17 DIAGNOSIS — F419 Anxiety disorder, unspecified: Secondary | ICD-10-CM | POA: Diagnosis not present

## 2017-08-17 MED ORDER — ERGOCALCIFEROL 1.25 MG (50000 UT) PO CAPS: 50000.0000 [IU] | ORAL_CAPSULE | ORAL | 0 refills | Status: DC

## 2017-08-17 MED ORDER — BUSPIRONE HCL 7.5 MG PO TABS: 7.5000 mg | ORAL_TABLET | Freq: Two times a day (BID) | ORAL | 1 refills | Status: DC

## 2017-08-17 NOTE — Progress Notes (Signed)
Discussed in office with patient her lab results.
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
## 2017-08-19 ENCOUNTER — Ambulatory Visit: Payer: 59

## 2017-08-31 ENCOUNTER — Ambulatory Visit (INDEPENDENT_AMBULATORY_CARE_PROVIDER_SITE_OTHER): Payer: 59 | Admitting: Physician Assistant

## 2017-08-31 ENCOUNTER — Encounter: Payer: Self-pay | Admitting: Physician Assistant

## 2017-08-31 VITALS — BP 127/66 | HR 90 | Ht 62.99 in | Wt 175.0 lb

## 2017-08-31 DIAGNOSIS — Z87440 Personal history of urinary (tract) infections: Secondary | ICD-10-CM | POA: Diagnosis not present

## 2017-08-31 DIAGNOSIS — R3989 Other symptoms and signs involving the genitourinary system: Secondary | ICD-10-CM | POA: Diagnosis not present

## 2017-08-31 DIAGNOSIS — R300 Dysuria: Secondary | ICD-10-CM | POA: Diagnosis not present

## 2017-08-31 DIAGNOSIS — N952 Postmenopausal atrophic vaginitis: Secondary | ICD-10-CM

## 2017-08-31 LAB — POCT URINALYSIS DIPSTICK
Bilirubin, UA: NEGATIVE
Blood, UA: NEGATIVE
Glucose, UA: NEGATIVE
Ketones, UA: NEGATIVE
LEUKOCYTES UA: NEGATIVE
Nitrite, UA: NEGATIVE
PH UA: 6.5 (ref 5.0–8.0)
PROTEIN UA: NEGATIVE
Spec Grav, UA: 1.02 (ref 1.010–1.025)
UROBILINOGEN UA: 0.2 U/dL

## 2017-08-31 MED ORDER — MOXIFLOXACIN HCL 400 MG PO TABS: 400.0000 mg | ORAL_TABLET | Freq: Every day | ORAL | 0 refills | Status: DC

## 2017-08-31 MED ORDER — ESTROGENS, CONJUGATED 0.625 MG/GM VA CREA: 1.0000 | TOPICAL_CREAM | Freq: Every day | VAGINAL | 6 refills | Status: DC

## 2017-08-31 NOTE — Progress Notes (Addendum)
Subjective:    Patient ID: Shirley Vasquez, female    DOB: 1966-05-28, 51 y.o.   MRN: [id number]  HPI  Patient is a 51 year old female who presents to the clinic after going to a clinic and diagnosed with a UTI.  She was given Macrobid and she finished treatment.  She was called back last Saturday and was told the Macrobid did not show sensitivity to the Klebsiella pneumonia found in urine culture.  Due to her allergies they did not know what else to treat her with.  They advised her to go to her PCP.  She continues to have some bladder pressure and urinary frequency.  She denies any fever, chills, nausea or vomiting.  .. Active Ambulatory Problems    Diagnosis Date Noted  . GERD (gastroesophageal reflux disease) 03/15/2012  . Panic attacks 03/15/2012  . Anxiety 03/15/2012  . Hyperlipidemia 06/12/2012  . Asthma 10/18/2012  . Carpal tunnel syndrome 10/03/2013  . Greater trochanteric bursitis of both hips 04/01/2014  . Low back pain 07/31/2014  . Major depressive disorder, recurrent episode, moderate ([place]) 01/06/2015  . Stress headache 01/06/2015  . Morton's neuroma, right 3/4, left 2/3  03/13/2015  . Right cervical radiculopathy 12/25/2015  . Impaired cognition 01/29/2016  . 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 Bursitis, trochanteric 08/07/2014  . Dysfunction of eustachian tube 08/12/2015  . Elevated LDL cholesterol level 05/03/2016  . Vitamin D insufficiency 05/04/2016  . Low iron stores 11/09/2016  . IDA (iron deficiency anemia) 11/09/2016  . Word finding difficulty 12/22/2016  . Decreased ROM of neck 02/09/2017  . Neck pain 02/09/2017  . Allergy to multiple antibiotics 04/22/2017  . Klebsiella cystitis 04/22/2017  . Multiple head injury 07/31/2017  . Memory changes 07/31/2017  . Family history of dementia 07/31/2017  . Memory loss 08/01/2017  . Vitamin D deficiency 08/17/2017   Resolved Ambulatory Problems    Diagnosis Date Noted  . Neck muscle spasm 05/03/2013   . Poison ivy dermatitis 05/03/2013  . Greater trochanteric bursitis of right hip 07/01/2014  . Carpal tunnel syndrome, right 03/13/2015   Past Medical History:  Diagnosis Date  . Anxiety   . Asthma   . Fibroid   . GERD (gastroesophageal reflux disease)       Review of Systems  All other systems reviewed and are negative.      Objective:   Physical Exam  Constitutional: She is oriented to person, place, and time. She appears well-developed and well-nourished.  HENT:  Head: Normocephalic and atraumatic.  Cardiovascular: Normal rate, regular rhythm and normal heart sounds.  Pulmonary/Chest: Effort normal and breath sounds normal.  NO CVA tenderness.   Abdominal: Soft. Bowel sounds are normal. She exhibits no distension and no mass. There is tenderness. There is no rebound and no guarding.  Suprapubic tenderness to palpation.   Neurological: She is alert and oriented to person, place, and time.  Psychiatric: She has a normal mood and affect. Her behavior is normal.          Assessment & Plan:  Marland KitchenMarland KitchenDiagnoses and all orders for this visit:  Dysuria -     POCT urinalysis dipstick -     Urine Culture  Sensation of pressure in bladder area -     moxifloxacin (AVELOX) 400 MG tablet; Take 1 tablet (400 mg total) by mouth daily at 8 pm. For 7 days.  Vaginal atrophy  History of UTI -     conjugated estrogens (PREMARIN) vaginal cream; Place 1 Applicatorful  vaginally daily. After 2 weeks decrease to 2-3 times a week to control symptoms.   .. Results for orders placed or performed in visit on 08/31/17  POCT urinalysis dipstick  Result Value Ref Range   Color, UA YELLOW    Clarity, UA CLEAR    Glucose, UA NEGATIVE    Bilirubin, UA NEGATIVE    Ketones, UA NEGATIVE    Spec Grav, UA 1.020 1.010 - 1.025   Blood, UA NEGATIVE    pH, UA 6.5 5.0 - 8.0   Protein, UA NEGATIVE    Urobilinogen, UA 0.2 0.2 or 1.0 E.U./dL   Nitrite, UA NEGATIVE    Leukocytes, UA Negative Negative     Dipstick is negative for infection;however, patient is symptomatic and culture showed resistance. Will reviewed [facility] from [facility]. Reviewed on 11/29 and showed intermediate response to macrobid not resistance. Due to being symptomatic will try 2nd abx.   Will treat with avelox and told to take benadryl with avelox. Pt has never had rash with this but she has with levaquin. Follow up as needed.   I think some of her recent UTI's could benefit from some vaginal estrogen. Use vaginally with a small amt around urethra.

## 2017-09-03 LAB — URINE CULTURE
MICRO NUMBER:: 81337815
SPECIMEN QUALITY:: ADEQUATE

## 2017-09-13 ENCOUNTER — Ambulatory Visit: Payer: 59

## 2017-09-19 ENCOUNTER — Encounter: Payer: Self-pay | Admitting: Physician Assistant

## 2017-09-19 ENCOUNTER — Ambulatory Visit (INDEPENDENT_AMBULATORY_CARE_PROVIDER_SITE_OTHER): Payer: 59 | Admitting: Physician Assistant

## 2017-09-19 VITALS — Temp 98.3°F

## 2017-09-19 DIAGNOSIS — Z09 Encounter for follow-up examination after completed treatment for conditions other than malignant neoplasm: Secondary | ICD-10-CM

## 2017-09-19 NOTE — Progress Notes (Signed)
   Subjective:    Patient ID: Shirley Vasquez, female    DOB: Mar 02, 1966, 51 y.o.   MRN: [id number]  HPI Patient here to give urine specimen following 2 courses of antibiotics for UTI. She states she is symptom free.    Review of Systems     Objective:   Physical Exam        Assessment & Plan:  Specimen obtained and sent for culture.

## 2017-09-28 ENCOUNTER — Other Ambulatory Visit: Payer: Self-pay | Admitting: Physician Assistant

## 2017-09-28 DIAGNOSIS — R4789 Other speech disturbances: Secondary | ICD-10-CM

## 2017-09-28 DIAGNOSIS — R4189 Other symptoms and signs involving cognitive functions and awareness: Secondary | ICD-10-CM

## 2017-09-28 DIAGNOSIS — F419 Anxiety disorder, unspecified: Secondary | ICD-10-CM

## 2017-10-03 LAB — HM COLONOSCOPY

## 2017-10-13 ENCOUNTER — Ambulatory Visit: Payer: 59

## 2017-10-19 ENCOUNTER — Encounter: Payer: Self-pay | Admitting: Physician Assistant

## 2017-10-19 ENCOUNTER — Ambulatory Visit (INDEPENDENT_AMBULATORY_CARE_PROVIDER_SITE_OTHER): Payer: 59 | Admitting: Physician Assistant

## 2017-10-19 VITALS — BP 131/63 | HR 82 | Ht 63.0 in | Wt 178.0 lb

## 2017-10-19 DIAGNOSIS — D508 Other iron deficiency anemias: Secondary | ICD-10-CM | POA: Diagnosis not present

## 2017-10-19 DIAGNOSIS — K5730 Diverticulosis of large intestine without perforation or abscess without bleeding: Secondary | ICD-10-CM | POA: Diagnosis not present

## 2017-10-19 DIAGNOSIS — F419 Anxiety disorder, unspecified: Secondary | ICD-10-CM | POA: Diagnosis not present

## 2017-10-19 DIAGNOSIS — R4789 Other speech disturbances: Secondary | ICD-10-CM | POA: Diagnosis not present

## 2017-10-19 DIAGNOSIS — F410 Panic disorder [episodic paroxysmal anxiety] without agoraphobia: Secondary | ICD-10-CM | POA: Diagnosis not present

## 2017-10-19 DIAGNOSIS — F331 Major depressive disorder, recurrent, moderate: Secondary | ICD-10-CM | POA: Diagnosis not present

## 2017-10-19 DIAGNOSIS — K648 Other hemorrhoids: Secondary | ICD-10-CM

## 2017-10-19 DIAGNOSIS — E559 Vitamin D deficiency, unspecified: Secondary | ICD-10-CM | POA: Diagnosis not present

## 2017-10-19 DIAGNOSIS — Z13220 Encounter for screening for lipoid disorders: Secondary | ICD-10-CM | POA: Diagnosis not present

## 2017-10-19 DIAGNOSIS — Z131 Encounter for screening for diabetes mellitus: Secondary | ICD-10-CM | POA: Diagnosis not present

## 2017-10-19 DIAGNOSIS — K449 Diaphragmatic hernia without obstruction or gangrene: Secondary | ICD-10-CM | POA: Diagnosis not present

## 2017-10-19 DIAGNOSIS — K5790 Diverticulosis of intestine, part unspecified, without perforation or abscess without bleeding: Secondary | ICD-10-CM | POA: Insufficient documentation

## 2017-10-19 MED ORDER — CLONAZEPAM 1 MG PO TABS: ORAL_TABLET | ORAL | 0 refills | Status: DC

## 2017-10-19 MED ORDER — BUSPIRONE HCL 7.5 MG PO TABS: 7.5000 mg | ORAL_TABLET | Freq: Two times a day (BID) | ORAL | 5 refills | Status: DC

## 2017-10-19 MED ORDER — FLUOXETINE HCL 20 MG PO TABS: ORAL_TABLET | ORAL | 5 refills | Status: DC

## 2017-10-19 NOTE — Progress Notes (Signed)
Subjective:    Patient ID: Shirley Vasquez, female    DOB: 1966-09-10, 52 y.o.   MRN: [id number]  HPI  Pt is a 52 yo female who presents to the clinic for follow up on MDD, anxiety, word finding difficulty. The increase of buspar has helped her a lot.   Pt is doing much better with mood. She is seeing her counselor once a week. The buspar increase has done well with her. She is managing her life stress. Her father has even been admitted to the hospital and has dx of bipolar with personality disorder with frontal lobe dementia and she is handling it well. She is taking klonapin around every other day. No SI/HC thoughts. She is exercising and feels great.   Pt recently has seen GI for colonoscopy, endoscopy and barium swallow. She has been told she has hiatal hernia and told to increase nexium to twice a day.   .. Active Ambulatory Problems    Diagnosis Date Noted  . GERD (gastroesophageal reflux disease) 03/15/2012  . Panic attacks 03/15/2012  . Anxiety 03/15/2012  . Hyperlipidemia 06/12/2012  . Asthma 10/18/2012  . Carpal tunnel syndrome 10/03/2013  . Greater trochanteric bursitis of both hips 04/01/2014  . Low back pain 07/31/2014  . Major depressive disorder, recurrent episode, moderate ([place]) 01/06/2015  . Stress headache 01/06/2015  . Morton's neuroma, right 3/4, left 2/3  03/13/2015  . Right cervical radiculopathy 12/25/2015  . Impaired cognition 01/29/2016  . 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 Bursitis, trochanteric 08/07/2014  . Dysfunction of eustachian tube 08/12/2015  . Elevated LDL cholesterol level 05/03/2016  . Vitamin D insufficiency 05/04/2016  . Low iron stores 11/09/2016  . IDA (iron deficiency anemia) 11/09/2016  . Word finding difficulty 12/22/2016  . Decreased ROM of neck 02/09/2017  . Neck pain 02/09/2017  . Allergy to multiple antibiotics 04/22/2017  . Multiple head injury 07/31/2017  . Memory changes 07/31/2017  . Family history of dementia  07/31/2017  . Memory loss 08/01/2017  . Vitamin D deficiency 08/17/2017  . Hiatal hernia 10/19/2017  . Internal hemorrhoids 10/19/2017  . Diverticulosis 10/19/2017   Resolved Ambulatory Problems    Diagnosis Date Noted  . Neck muscle spasm 05/03/2013  . Poison ivy dermatitis 05/03/2013  . Greater trochanteric bursitis of right hip 07/01/2014  . Carpal tunnel syndrome, right 03/13/2015  . Klebsiella cystitis 04/22/2017   Past Medical History:  Diagnosis Date  . Anxiety   . Asthma   . Fibroid   . GERD (gastroesophageal reflux disease)          Review of Systems  All other systems reviewed and are negative.      Objective:   Physical Exam  Constitutional: She is oriented to person, place, and time. She appears well-developed and well-nourished.  HENT:  Head: Normocephalic and atraumatic.  Cardiovascular: Normal rate, regular rhythm and normal heart sounds.  Pulmonary/Chest: Effort normal and breath sounds normal.  Neurological: She is alert and oriented to person, place, and time.  Psychiatric: She has a normal mood and affect. Her behavior is normal.          Assessment & Plan:  .Marland KitchenMarland KitchenAimee was seen today for anxiety.  Diagnoses and all orders for this visit:  Major depressive disorder, recurrent episode, moderate ([facility])  Anxiety -     busPIRone (BUSPAR) 7.5 MG tablet; Take 1 tablet (7.5 mg total) by mouth 2 (two) times daily. -     FLUoxetine (PROZAC) 20 MG tablet;  Take 3 tablets daily for mood. -     clonazePAM (KLONOPIN) 1 MG tablet; TAKE 1 TABLET BY MOUTH TWICE DAILY AS NEEDED  Word finding difficulty -     FLUoxetine (PROZAC) 20 MG tablet; Take 3 tablets daily for mood.  Hiatal hernia  Screening for lipid disorders -     Lipid Panel w/reflex Direct LDL  Screening for diabetes mellitus -     COMPLETE METABOLIC PANEL WITH GFR  Vitamin D deficiency -     Vitamin D 1,25 dihydroxy  Other iron deficiency anemia -     CBC -     Ferritin  Panic  attacks  Internal hemorrhoids  Diverticulosis of large intestine without hemorrhage  .Marland Kitchen Depression screen [facility] 2/9 10/19/2017 08/17/2017 05/24/2017 02/09/2017 12/21/2016  Decreased Interest 0 1 1 0 0  Down, Depressed, Hopeless 0 1 1 0 0  PHQ - 2 Score 0 2 2 0 0  Altered sleeping 0 3 - 0 0  Tired, decreased energy 0 1 - 0 0  Change in appetite 0 1 - 0 0  Feeling bad or failure about yourself  0 0 - 0 0  Trouble concentrating 0 0 - 0 0  Moving slowly or fidgety/restless 0 1 - 1 1  Suicidal thoughts 0 0 - 0 0  PHQ-9 Score 0 8 - 1 1  Difficult doing work/chores - Somewhat difficult - - -   .Marland Kitchen GAD 7 : Generalized Anxiety Score 10/19/2017 08/17/2017  Nervous, Anxious, on Edge 0 2  Control/stop worrying 0 0  Worry too much - different things 0 0  Trouble relaxing 0 0  Restless 0 0  Easily annoyed or irritable 0 0  Afraid - awful might happen 1 0  Total GAD 7 Score 1 2  Anxiety Difficulty - Somewhat difficult   Refilled prozac, klonapin, buspar. Follow up in 6 months. Doing great.   Up dated GI history.  Mammogram scheduled. Has appt with GYN next week.   Ordered fasting labs. Up to date on vaccines and screening. Will discuss shingrix at next visit.

## 2017-10-20 ENCOUNTER — Encounter: Payer: Self-pay | Admitting: Physician Assistant

## 2017-10-25 ENCOUNTER — Ambulatory Visit (INDEPENDENT_AMBULATORY_CARE_PROVIDER_SITE_OTHER): Payer: 59 | Admitting: Obstetrics & Gynecology

## 2017-10-25 ENCOUNTER — Encounter: Payer: Self-pay | Admitting: Obstetrics & Gynecology

## 2017-10-25 VITALS — BP 116/66 | HR 82 | Resp 16 | Ht 63.0 in | Wt 180.0 lb

## 2017-10-25 DIAGNOSIS — Z124 Encounter for screening for malignant neoplasm of cervix: Secondary | ICD-10-CM | POA: Diagnosis not present

## 2017-10-25 DIAGNOSIS — Z1151 Encounter for screening for human papillomavirus (HPV): Secondary | ICD-10-CM

## 2017-10-25 DIAGNOSIS — Z01419 Encounter for gynecological examination (general) (routine) without abnormal findings: Secondary | ICD-10-CM | POA: Diagnosis not present

## 2017-10-25 DIAGNOSIS — N898 Other specified noninflammatory disorders of vagina: Secondary | ICD-10-CM

## 2017-10-25 DIAGNOSIS — R102 Pelvic and perineal pain: Secondary | ICD-10-CM

## 2017-10-25 DIAGNOSIS — N94819 Vulvodynia, unspecified: Secondary | ICD-10-CM

## 2017-10-25 DIAGNOSIS — N3941 Urge incontinence: Secondary | ICD-10-CM

## 2017-10-25 MED ORDER — LIDOCAINE 2 % EX GEL: 1.0000 | CUTANEOUS | 3 refills | Status: DC | PRN

## 2017-10-26 DIAGNOSIS — N94819 Vulvodynia, unspecified: Secondary | ICD-10-CM | POA: Insufficient documentation

## 2017-10-26 DIAGNOSIS — N3941 Urge incontinence: Secondary | ICD-10-CM | POA: Insufficient documentation

## 2017-10-26 MED ORDER — LIDOCAINE 5 % EX OINT: 1.0000 "application " | TOPICAL_OINTMENT | CUTANEOUS | 1 refills | Status: DC | PRN

## 2017-10-26 MED ORDER — MIRABEGRON ER 25 MG PO TB24: 25.0000 mg | ORAL_TABLET | Freq: Every day | ORAL | Status: DC

## 2017-10-26 NOTE — Progress Notes (Signed)
Subjective:     Shirley Vasquez is a 52 y.o. female here for a routine exam.  Current complaints: pain on insertion for about 1 year.  Burns during sex and after es.  Can burn after BM and other stimulation of pudendal nerve.  Tried 5 weeks of premarin without improvement.  Has history of birth trauma after vacuum of 10 pound infant.  Neede physical therapy.  Sex was not painful until about 1 year ago.   Pt also c/o urge incontinence symtpoms.  No stres (has history of bladder suspension); can empty entire bladder at times when not near bathroom.   Gynecologic History No LMP recorded. Patient has had an ablation. Contraception: post menopausal status Last Pap: several years ago with gyn---nml per patient; history of cryo years ago.. Last mammogram: 2017. Results were: normal  Obstetric History OB History  Gravida Para Term Preterm AB Living  4 2 2   2     SAB TAB Ectopic Multiple Live Births  2       2    # Outcome Date GA Lbr Len/2nd Weight Sex Delivery Anes PTL Lv  4 SAB           3 SAB           2 Term      Vag-Spont     1 Term      Vag-Spont          The following portions of the patient's history were reviewed and updated as appropriate: allergies, current medications, past family history, past medical history, past social history, past surgical history and problem list.  Review of Systems Pertinent items noted in HPI and remainder of comprehensive ROS otherwise negative.    Objective:      Vitals:   10/25/17 1355  BP: 116/66  Pulse: 82  Resp: 16  Weight: 180 lb (81.6 kg)  Height: 5\' 3"  (1.6 m)   Vitals:  WNL General appearance: alert, cooperative and no distress  HEENT: Normocephalic, without obvious abnormality, atraumatic Eyes: negative Throat: lips, mucosa, and tongue normal; teeth and gums normal  Respiratory: Clear to auscultation bilaterally  CV: Regular rate and rhythm  Breasts:  Normal appearance, no masses or tenderness, no nipple retraction or dimpling   GI: Soft, non-tender; bowel sounds normal; no masses,  no organomegaly  GU: External Genitalia:  Tanner V,  Redness nad pain along skene's glands and vestibular glands, no ulcerations Vagin:  Pale pink, not atrophic vaginitis (not on Premarin for a bout 1 month) Urethra:  No prolapse   Vagina: Pink, normal rugae, no blood or discharge  Cervix: No CMT, no lesion  Uterus:  Normal size and contour, non tender  Adnexa: Normal, no masses, non tender  Musculoskeletal: No edema, redness or tenderness in the calves or thighs  Skin: No lesions or rash  Lymphatic: Axillary adenopathy: none     Psychiatric: Normal mood and behavior    Assessment:    Healthy female exam.   Provoked na unprovoked vuvodynia Urge incontinence   Plan:   1.  Health [facility]--pap with cotesting, mammogram; colonoscopy is up to date 2. Urge incontinence:  Myrbertiq (will montior bp while on this med) 3.  Vulvodynia--lidocaine and replends for symptom relief; pelvic PT referral. 4.  RTC 8 weeks.

## 2017-10-27 LAB — CYTOLOGY - PAP
Bacterial vaginitis: NEGATIVE
CANDIDA VAGINITIS: NEGATIVE
Diagnosis: NEGATIVE
HPV (WINDOPATH): NOT DETECTED

## 2017-10-28 ENCOUNTER — Ambulatory Visit
Admission: RE | Admit: 2017-10-28 | Discharge: 2017-10-28 | Disposition: A | Discharge status: Home / Self Care | Payer: 59 | Source: Ambulatory Visit | Attending: Physician Assistant | Admitting: Physician Assistant

## 2017-10-28 ENCOUNTER — Telehealth: Payer: Self-pay

## 2017-10-28 DIAGNOSIS — Z1231 Encounter for screening mammogram for malignant neoplasm of breast: Secondary | ICD-10-CM

## 2017-10-28 NOTE — Telephone Encounter (Signed)
Spoke with pt and she is aware that her pap smear was normal and showed no signs of BV or yeast.

## 2017-10-28 NOTE — Progress Notes (Signed)
Call pt: normal mammogram follow up in 1 year.

## 2017-12-11 ENCOUNTER — Other Ambulatory Visit: Payer: Self-pay | Admitting: Physician Assistant

## 2017-12-11 DIAGNOSIS — E559 Vitamin D deficiency, unspecified: Secondary | ICD-10-CM

## 2017-12-23 ENCOUNTER — Telehealth: Payer: Self-pay | Admitting: Obstetrics & Gynecology

## 2017-12-23 MED ORDER — MIRABEGRON ER 25 MG PO TB24: 25.0000 mg | ORAL_TABLET | Freq: Every day | ORAL | 0 refills | Status: DC

## 2017-12-23 NOTE — Telephone Encounter (Signed)
Pt called requesting "bladder pill" sent to [facility] on [address] in [facility] pt says they are not able to locate record of that prescription. ALSO pt canceled appt next week 12/28/17 she is going for iron infusions and her father has been hospitalized with dementia. Pt also states she has talked to Physical Therapy and they agreed not to scheduled until after 4/12 after her last infusion. Pt request Return call to confirm medication refill.

## 2017-12-23 NOTE — Telephone Encounter (Signed)
Pt called office requesting a RF on her RX for Myrbetriq 25 mg.  Pt has cancelled her appt next week with Dr Gala Romney due to having an iron infusion and her dad has been hospitalized.  She has also spoken with PT about post poning her PT until after her last iron infusion.

## 2017-12-28 ENCOUNTER — Ambulatory Visit: Payer: 59 | Admitting: Obstetrics & Gynecology

## 2018-01-11 DIAGNOSIS — K449 Diaphragmatic hernia without obstruction or gangrene: Secondary | ICD-10-CM | POA: Insufficient documentation

## 2018-01-11 DIAGNOSIS — K432 Incisional hernia without obstruction or gangrene: Secondary | ICD-10-CM | POA: Insufficient documentation

## 2018-01-30 ENCOUNTER — Encounter: Payer: Self-pay | Admitting: Physician Assistant

## 2018-01-30 ENCOUNTER — Ambulatory Visit (INDEPENDENT_AMBULATORY_CARE_PROVIDER_SITE_OTHER): Payer: 59 | Admitting: Physician Assistant

## 2018-01-30 VITALS — BP 132/80 | HR 73 | Temp 98.0°F | Ht 63.0 in | Wt 178.9 lb

## 2018-01-30 DIAGNOSIS — F419 Anxiety disorder, unspecified: Secondary | ICD-10-CM | POA: Diagnosis not present

## 2018-01-30 DIAGNOSIS — K449 Diaphragmatic hernia without obstruction or gangrene: Secondary | ICD-10-CM | POA: Diagnosis not present

## 2018-01-30 MED ORDER — CLONAZEPAM 1 MG PO TABS: ORAL_TABLET | ORAL | 3 refills | Status: DC

## 2018-01-30 NOTE — Progress Notes (Signed)
Subjective:    Patient ID: Shirley Vasquez, female    DOB: 21-Nov-1965, 52 y.o.   MRN: [id number]  HPI  Pt is a 52 yo pleasant female with anxiety who presents to the clinic to discuss upcoming surgery to repair paraesophegeal hernia on may 20th by Dr. Tressie Ellis. She is very nervous. She is most nervous because her anxiety around swallowing, choking, breathing. She would like a refill today of klonapin. She feels like she might need them while she is recovering.   She is feeling much more intergized with iron infusions. Last one was on aprl 18th.   .. Active Ambulatory Problems    Diagnosis Date Noted  . GERD (gastroesophageal reflux disease) 03/15/2012  . Panic attacks 03/15/2012  . Anxiety 03/15/2012  . Hyperlipidemia 06/12/2012  . Asthma 10/18/2012  . Carpal tunnel syndrome 10/03/2013  . Greater trochanteric bursitis of both hips 04/01/2014  . Low back pain 07/31/2014  . Major depressive disorder, recurrent episode, moderate ([place]) 01/06/2015  . Stress headache 01/06/2015  . Morton's neuroma, right 3/4, left 2/3  03/13/2015  . Right cervical radiculopathy 12/25/2015  . Impaired cognition 01/29/2016  . 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 Bursitis, trochanteric 08/07/2014  . Dysfunction of eustachian tube 08/12/2015  . Elevated LDL cholesterol level 05/03/2016  . Vitamin D insufficiency 05/04/2016  . Low iron stores 11/09/2016  . IDA (iron deficiency anemia) 11/09/2016  . Word finding difficulty 12/22/2016  . Decreased ROM of neck 02/09/2017  . Neck pain 02/09/2017  . Allergy to multiple antibiotics 04/22/2017  . Multiple head injury 07/31/2017  . Memory changes 07/31/2017  . Family history of dementia 07/31/2017  . Memory loss 08/01/2017  . Vitamin D deficiency 08/17/2017  . Hiatal hernia 10/19/2017  . Internal hemorrhoids 10/19/2017  . Diverticulosis 10/19/2017  . Vulvodynia 10/26/2017  . Urge incontinence 10/26/2017  . Paraesophageal hernia 01/11/2018  .  Incisional hernia, without obstruction or gangrene 01/11/2018   Resolved Ambulatory Problems    Diagnosis Date Noted  . Neck muscle spasm 05/03/2013  . Poison ivy dermatitis 05/03/2013  . Greater trochanteric bursitis of right hip 07/01/2014  . Carpal tunnel syndrome, right 03/13/2015  . Klebsiella cystitis 04/22/2017   Past Medical History:  Diagnosis Date  . Anemia   . Anxiety   . Asthma   . Fibroid   . GERD (gastroesophageal reflux disease)   . Mental disorder   . Vaginal Pap smear, abnormal        Review of Systems See HPI.     Objective:   Physical Exam  Constitutional: She is oriented to person, place, and time. She appears well-developed and well-nourished.  HENT:  Head: Normocephalic and atraumatic.  Cardiovascular: Normal rate and regular rhythm.  Pulmonary/Chest: Effort normal and breath sounds normal.  Abdominal: Soft. Bowel sounds are normal. She exhibits distension. There is tenderness.  Left upper quadrant and epigastric area tender to palpation. No rebound or guarding.   Neurological: She is alert and oriented to person, place, and time.          Assessment & Plan:  Marland KitchenMarland KitchenDiagnoses and all orders for this visit:  Paraesophageal hernia  Anxiety -     clonazePAM (KLONOPIN) 1 MG tablet; TAKE 1 TABLET BY MOUTH TWICE DAILY AS NEEDED   Reassurance given about surgery and how this is needed and going to help her feel better. Discussed communication about her anxiety to staff and how they will make her calm as she is healing. Discussed ok  to take klonapin after surgery but NOT with opiates for pain control due to increase risk of sudden death. I did refill today. Overall patient is doing well and ready for surgery.

## 2018-02-24 ENCOUNTER — Other Ambulatory Visit: Payer: Self-pay | Admitting: Physician Assistant

## 2018-02-24 DIAGNOSIS — E559 Vitamin D deficiency, unspecified: Secondary | ICD-10-CM

## 2018-03-07 NOTE — Progress Notes (Signed)
Call pt: elevated LDL however 10 year CV risk is 1.9 percent pretty low. Will continue to watch. Continue to make healthy low fat/reduced fatty food diet.  Iron stores back up.   Vitamin D is pending.

## 2018-03-09 LAB — FERRITIN: Ferritin: 175 ng/mL (ref 10–232)

## 2018-03-09 LAB — CBC
HCT: 38.9 % (ref 35.0–45.0)
Hemoglobin: 13.2 g/dL (ref 11.7–15.5)
MCH: 29.9 pg (ref 27.0–33.0)
MCHC: 33.9 g/dL (ref 32.0–36.0)
MCV: 88.2 fL (ref 80.0–100.0)
MPV: 10.7 fL (ref 7.5–12.5)
PLATELETS: 217 10*3/uL (ref 140–400)
RBC: 4.41 10*6/uL (ref 3.80–5.10)
RDW: 17.2 % — AB (ref 11.0–15.0)
WBC: 3.3 10*3/uL — AB (ref 3.8–10.8)

## 2018-03-09 LAB — COMPLETE METABOLIC PANEL WITH GFR
AG RATIO: 1.6 (calc) (ref 1.0–2.5)
ALBUMIN MSPROF: 4.1 g/dL (ref 3.6–5.1)
ALKALINE PHOSPHATASE (APISO): 64 U/L (ref 33–130)
ALT: 29 U/L (ref 6–29)
AST: 20 U/L (ref 10–35)
BILIRUBIN TOTAL: 0.5 mg/dL (ref 0.2–1.2)
BUN: 13 mg/dL (ref 7–25)
CO2: 25 mmol/L (ref 20–32)
CREATININE: 0.8 mg/dL (ref 0.50–1.05)
Calcium: 9.4 mg/dL (ref 8.6–10.4)
Chloride: 105 mmol/L (ref 98–110)
GFR, Est African American: 98 mL/min/{1.73_m2} (ref >=60)
GFR, Est Non African American: 85 mL/min/{1.73_m2} (ref >=60)
GLOBULIN: 2.5 g/dL (ref 1.9–3.7)
Glucose, Bld: 96 mg/dL (ref 65–99)
POTASSIUM: 4.4 mmol/L (ref 3.5–5.3)
SODIUM: 139 mmol/L (ref 135–146)
Total Protein: 6.6 g/dL (ref 6.1–8.1)

## 2018-03-09 LAB — LIPID PANEL W/REFLEX DIRECT LDL
CHOL/HDL RATIO: 4.4 (calc) (ref <5.0)
Cholesterol: 253 mg/dL — ABNORMAL HIGH (ref <200)
HDL: 58 mg/dL (ref >50)
LDL Cholesterol (Calc): 174 mg/dL (calc) — ABNORMAL HIGH
Non-HDL Cholesterol (Calc): 195 mg/dL (calc) — ABNORMAL HIGH (ref <130)
TRIGLYCERIDES: 94 mg/dL (ref <150)

## 2018-03-09 LAB — VITAMIN D 1,25 DIHYDROXY
VITAMIN D2 1, 25 (OH): 30 pg/mL
VITAMIN D3 1, 25 (OH): 20 pg/mL
Vitamin D 1, 25 (OH)2 Total: 50 pg/mL (ref 18–72)

## 2018-03-09 NOTE — Progress Notes (Signed)
Vitamin D is great.

## 2018-04-16 ENCOUNTER — Other Ambulatory Visit: Payer: Self-pay | Admitting: Physician Assistant

## 2018-04-16 DIAGNOSIS — F419 Anxiety disorder, unspecified: Secondary | ICD-10-CM

## 2018-04-18 ENCOUNTER — Ambulatory Visit (INDEPENDENT_AMBULATORY_CARE_PROVIDER_SITE_OTHER): Payer: 59 | Admitting: Physician Assistant

## 2018-04-18 ENCOUNTER — Encounter: Payer: Self-pay | Admitting: Physician Assistant

## 2018-04-18 VITALS — BP 116/66 | HR 77 | Wt 173.0 lb

## 2018-04-18 DIAGNOSIS — E559 Vitamin D deficiency, unspecified: Secondary | ICD-10-CM | POA: Diagnosis not present

## 2018-04-18 DIAGNOSIS — F419 Anxiety disorder, unspecified: Secondary | ICD-10-CM

## 2018-04-18 DIAGNOSIS — F331 Major depressive disorder, recurrent, moderate: Secondary | ICD-10-CM

## 2018-04-18 DIAGNOSIS — K449 Diaphragmatic hernia without obstruction or gangrene: Secondary | ICD-10-CM | POA: Diagnosis not present

## 2018-04-18 DIAGNOSIS — R4789 Other speech disturbances: Secondary | ICD-10-CM | POA: Diagnosis not present

## 2018-04-18 MED ORDER — ERGOCALCIFEROL 1.25 MG (50000 UT) PO CAPS: 50000.0000 [IU] | ORAL_CAPSULE | ORAL | 0 refills | Status: DC

## 2018-04-18 MED ORDER — BUSPIRONE HCL 7.5 MG PO TABS: ORAL_TABLET | ORAL | 5 refills | Status: DC

## 2018-04-18 MED ORDER — FLUOXETINE HCL 20 MG PO TABS: ORAL_TABLET | ORAL | 5 refills | Status: DC

## 2018-04-18 NOTE — Progress Notes (Signed)
Subjective:    Patient ID: Shirley Vasquez, female    DOB: 11/14/1965, 52 y.o.   MRN: [id number]  HPI  Pt is a 52 yo female who presents to the clinic for follow up. She had a paraesophageal hernia repair on 5/20. She has done well since surgery. She has just started to advance diet. She is taking all her medications. Her GI doctor discussed how she should take her vitamin D. She is not on protonix right now.   Her mood is doing ok. She is not going to counseling as frequent. She sees her every 2-3 weeks. Taking klonapin most daily but sticking to when she really needs it.    .. Active Ambulatory Problems    Diagnosis Date Noted  . GERD (gastroesophageal reflux disease) 03/15/2012  . Panic attacks 03/15/2012  . Anxiety 03/15/2012  . Hyperlipidemia 06/12/2012  . Asthma 10/18/2012  . Carpal tunnel syndrome 10/03/2013  . Greater trochanteric bursitis of both hips 04/01/2014  . Low back pain 07/31/2014  . Major depressive disorder, recurrent episode, moderate ([place]) 01/06/2015  . Stress headache 01/06/2015  . Morton's neuroma, right 3/4, left 2/3  03/13/2015  . Right cervical radiculopathy 12/25/2015  . Impaired cognition 01/29/2016  . 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 Bursitis, trochanteric 08/07/2014  . Dysfunction of eustachian tube 08/12/2015  . Elevated LDL cholesterol level 05/03/2016  . Vitamin D insufficiency 05/04/2016  . Low iron stores 11/09/2016  . IDA (iron deficiency anemia) 11/09/2016  . Word finding difficulty 12/22/2016  . Decreased ROM of neck 02/09/2017  . Neck pain 02/09/2017  . Allergy to multiple antibiotics 04/22/2017  . Multiple head injury 07/31/2017  . Memory changes 07/31/2017  . Family history of dementia 07/31/2017  . Memory loss 08/01/2017  . Vitamin D deficiency 08/17/2017  . Hiatal hernia 10/19/2017  . Internal hemorrhoids 10/19/2017  . Diverticulosis 10/19/2017  . Vulvodynia 10/26/2017  . Urge incontinence 10/26/2017  .  Paraesophageal hernia 01/11/2018  . Incisional hernia, without obstruction or gangrene 01/11/2018   Resolved Ambulatory Problems    Diagnosis Date Noted  . Neck muscle spasm 05/03/2013  . Poison ivy dermatitis 05/03/2013  . Greater trochanteric bursitis of right hip 07/01/2014  . Carpal tunnel syndrome, right 03/13/2015  . Klebsiella cystitis 04/22/2017   Past Medical History:  Diagnosis Date  . Anemia   . Anxiety   . Asthma   . Fibroid   . GERD (gastroesophageal reflux disease)   . Mental disorder   . Vaginal Pap smear, abnormal       Review of Systems  All other systems reviewed and are negative.      Objective:   Physical Exam  Constitutional: She is oriented to person, place, and time. She appears well-developed and well-nourished.  HENT:  Head: Normocephalic and atraumatic.  Cardiovascular: Normal rate and regular rhythm.  Pulmonary/Chest: Effort normal and breath sounds normal.  Neurological: She is alert and oriented to person, place, and time.  Psychiatric: She has a normal mood and affect. Her behavior is normal.          Assessment & Plan:  Marland KitchenMarland KitchenAimee was seen today for follow-up.  Diagnoses and all orders for this visit:  Vitamin D insufficiency -     ergocalciferol (VITAMIN D2) 50000 units capsule; Take 1 capsule (50,000 Units total) by mouth once a week.  Anxiety -     busPIRone (BUSPAR) 7.5 MG tablet; TAKE 1 TABLET(7.5 MG) BY MOUTH TWICE DAILY -  FLUoxetine (PROZAC) 20 MG tablet; Take 3 tablets daily for mood.  Word finding difficulty -     FLUoxetine (PROZAC) 20 MG tablet; Take 3 tablets daily for mood.  Paraesophageal hernia  Major depressive disorder, recurrent episode, moderate ([facility]) -     FLUoxetine (PROZAC) 20 MG tablet; Take 3 tablets daily for mood.   .. Depression screen [facility] 2/9 04/18/2018 10/19/2017 08/17/2017 05/24/2017 02/09/2017  Decreased Interest 0 0 1 1 0  Down, Depressed, Hopeless 0 0 1 1 0  PHQ - 2 Score 0 0 2 2 0   Altered sleeping 0 0 3 - 0  Tired, decreased energy 0 0 1 - 0  Change in appetite 0 0 1 - 0  Feeling bad or failure about yourself  0 0 0 - 0  Trouble concentrating 0 0 0 - 0  Moving slowly or fidgety/restless 0 0 1 - 1  Suicidal thoughts 0 0 0 - 0  PHQ-9 Score 0 0 8 - 1  Difficult doing work/chores Not difficult at all - Somewhat difficult - -   .Marland Kitchen GAD 7 : Generalized Anxiety Score 04/18/2018 10/19/2017 08/17/2017  Nervous, Anxious, on Edge 0 0 2  Control/stop worrying 0 0 0  Worry too much - different things 0 0 0  Trouble relaxing 0 0 0  Restless 0 0 0  Easily annoyed or irritable 0 0 0  Afraid - awful might happen 0 1 0  Total GAD 7 Score 0 1 2  Anxiety Difficulty Not difficult at all - Somewhat difficult    Opted to take vitamin D once weekly to avoid a daily tablet with recent procedure. Will recheck D levels in 3 months.   Pt is overall doing well. Refilled prozac and buspar for 6 months. She does not need klonapin at this time.

## 2018-04-20 ENCOUNTER — Encounter: Payer: Self-pay | Admitting: Physician Assistant

## 2018-07-01 ENCOUNTER — Other Ambulatory Visit: Payer: Self-pay | Admitting: Physician Assistant

## 2018-07-01 DIAGNOSIS — E559 Vitamin D deficiency, unspecified: Secondary | ICD-10-CM

## 2018-08-11 ENCOUNTER — Other Ambulatory Visit: Payer: Self-pay | Admitting: Physician Assistant

## 2018-08-11 DIAGNOSIS — F419 Anxiety disorder, unspecified: Secondary | ICD-10-CM

## 2018-08-11 DIAGNOSIS — R4789 Other speech disturbances: Secondary | ICD-10-CM

## 2018-08-21 ENCOUNTER — Other Ambulatory Visit: Payer: Self-pay

## 2018-08-21 DIAGNOSIS — F419 Anxiety disorder, unspecified: Secondary | ICD-10-CM

## 2018-08-23 ENCOUNTER — Other Ambulatory Visit: Payer: Self-pay | Admitting: Physician Assistant

## 2018-08-23 DIAGNOSIS — F419 Anxiety disorder, unspecified: Secondary | ICD-10-CM

## 2018-08-23 MED ORDER — CLONAZEPAM 1 MG PO TABS: ORAL_TABLET | ORAL | 1 refills | Status: DC

## 2018-09-15 LAB — CBC AND DIFFERENTIAL
HCT: 40 (ref 36–46)
Hemoglobin: 13.7 (ref 12.0–16.0)
PLATELETS: 252 (ref 150–399)
WBC: 5.3

## 2018-09-15 LAB — HEPATIC FUNCTION PANEL
ALK PHOS: 95 (ref 25–125)
ALT: 26 (ref 7–35)
AST: 17 (ref 13–35)
Bilirubin, Total: 0.3

## 2018-09-15 LAB — IRON,TIBC AND FERRITIN PANEL
%SAT: 31
Ferritin: 216
TIBC: 296
UIBC: 203

## 2018-09-15 LAB — BASIC METABOLIC PANEL
BUN: 13 (ref 4–21)
CREATININE: 0.9 (ref 0.5–1.1)
GLUCOSE: 79
Potassium: 4.4 (ref 3.4–5.3)
Sodium: 140 (ref 137–147)

## 2018-10-11 ENCOUNTER — Other Ambulatory Visit: Payer: Self-pay

## 2018-10-11 MED ORDER — PANTOPRAZOLE SODIUM 40 MG PO TBEC: 40.0000 mg | DELAYED_RELEASE_TABLET | Freq: Every day | ORAL | 1 refills | Status: DC

## 2018-10-11 NOTE — Telephone Encounter (Signed)
Requesting RF on Pantoprazole  RX to go to [facility] previously written by historical provider  Please review and send if OK

## 2018-10-18 ENCOUNTER — Ambulatory Visit: Payer: 59 | Admitting: Physician Assistant

## 2018-11-06 ENCOUNTER — Ambulatory Visit (INDEPENDENT_AMBULATORY_CARE_PROVIDER_SITE_OTHER): Payer: BLUE CROSS/BLUE SHIELD | Admitting: Physician Assistant

## 2018-11-06 ENCOUNTER — Encounter: Payer: Self-pay | Admitting: Physician Assistant

## 2018-11-06 VITALS — BP 127/86 | HR 76

## 2018-11-06 DIAGNOSIS — F419 Anxiety disorder, unspecified: Secondary | ICD-10-CM | POA: Diagnosis not present

## 2018-11-06 DIAGNOSIS — R4789 Other speech disturbances: Secondary | ICD-10-CM | POA: Diagnosis not present

## 2018-11-06 DIAGNOSIS — F331 Major depressive disorder, recurrent, moderate: Secondary | ICD-10-CM

## 2018-11-06 DIAGNOSIS — Z23 Encounter for immunization: Secondary | ICD-10-CM | POA: Diagnosis not present

## 2018-11-06 DIAGNOSIS — H9202 Otalgia, left ear: Secondary | ICD-10-CM | POA: Diagnosis not present

## 2018-11-06 DIAGNOSIS — Z1231 Encounter for screening mammogram for malignant neoplasm of breast: Secondary | ICD-10-CM

## 2018-11-06 DIAGNOSIS — G8929 Other chronic pain: Secondary | ICD-10-CM

## 2018-11-06 MED ORDER — BUSPIRONE HCL 7.5 MG PO TABS: ORAL_TABLET | ORAL | 1 refills | Status: DC

## 2018-11-06 MED ORDER — FLUOXETINE HCL 20 MG PO TABS: ORAL_TABLET | ORAL | 1 refills | Status: DC

## 2018-11-06 MED ORDER — CLONAZEPAM 1 MG PO TABS: ORAL_TABLET | ORAL | 1 refills | Status: DC

## 2018-11-06 NOTE — Progress Notes (Signed)
Subjective:    Patient ID: Shirley Vasquez, female    DOB: [birth year], 53 y.o.   MRN: [id number]  HPI  Pt is a 53 yo female with MDD, GAD who presents to the clinic for 6 month follow up.   She is doing really well. She had GI procedure and her symptoms are much better.   Her mood is good. She has mostly good days. She continues to go to counseling twice a month. She is taking her medications daily. She only as needed takes klonapin. No SI/HC. Her youngest daughter just got married.   She continues to have intermittent left ear pain. She has tubes in left ear. Follow up Thursday.   Pt interested in shingrix shot.   .. Active Ambulatory Problems    Diagnosis Date Noted  . GERD (gastroesophageal reflux disease) 03/15/2012  . Panic attacks 03/15/2012  . Anxiety 03/15/2012  . Hyperlipidemia 06/12/2012  . Asthma 10/18/2012  . Carpal tunnel syndrome 10/03/2013  . Greater trochanteric bursitis of both hips 04/01/2014  . Low back pain 07/31/2014  . Major depressive disorder, recurrent episode, moderate ([place]) 01/06/2015  . Stress headache 01/06/2015  . Morton's neuroma, right 3/4, left 2/3  03/13/2015  . Right cervical radiculopathy 12/25/2015  . Impaired cognition 01/29/2016  . 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 Bursitis, trochanteric 08/07/2014  . Dysfunction of eustachian tube 08/12/2015  . Elevated LDL cholesterol level 05/03/2016  . Vitamin D insufficiency 05/04/2016  . Low iron stores 11/09/2016  . IDA (iron deficiency anemia) 11/09/2016  . Word finding difficulty 12/22/2016  . Decreased ROM of neck 02/09/2017  . Neck pain 02/09/2017  . Allergy to multiple antibiotics 04/22/2017  . Multiple head injury 07/31/2017  . Memory changes 07/31/2017  . Family history of dementia 07/31/2017  . Memory loss 08/01/2017  . Vitamin D deficiency 08/17/2017  . Hiatal hernia 10/19/2017  . Internal hemorrhoids 10/19/2017  . Diverticulosis 10/19/2017  . Vulvodynia 10/26/2017   . Urge incontinence 10/26/2017  . Paraesophageal hernia 01/11/2018  . Incisional hernia, without obstruction or gangrene 01/11/2018   Resolved Ambulatory Problems    Diagnosis Date Noted  . Neck muscle spasm 05/03/2013  . Poison ivy dermatitis 05/03/2013  . Greater trochanteric bursitis of right hip 07/01/2014  . Carpal tunnel syndrome, right 03/13/2015  . Klebsiella cystitis 04/22/2017   Past Medical History:  Diagnosis Date  . Anemia   . Fibroid   . Mental disorder   . Vaginal Pap smear, abnormal      Review of Systems  All other systems reviewed and are negative.      Objective:   Physical Exam Vitals signs reviewed.  Constitutional:      Appearance: Normal appearance.  HENT:     Head: Normocephalic and atraumatic.     Right Ear: Tympanic membrane, ear canal and external ear normal.     Left Ear: Ear canal and external ear normal.     Ears:     Comments: Left T tube appears like coming out of ear and opening hitting ear canal. Some scant drainage from tube.     Mouth/Throat:     Mouth: Mucous membranes are moist.  Cardiovascular:     Rate and Rhythm: Normal rate and regular rhythm.     Pulses: Normal pulses.  Pulmonary:     Effort: Pulmonary effort is normal.     Breath sounds: Normal breath sounds.  Neurological:     Mental Status: She is alert.  Assessment & Plan:  Marland KitchenMarland KitchenAimee was seen today for follow-up.  Diagnoses and all orders for this visit:  Anxiety -     busPIRone (BUSPAR) 7.5 MG tablet; TAKE 1 TABLET(7.5 MG) BY MOUTH TWICE DAILY -     clonazePAM (KLONOPIN) 1 MG tablet; TAKE 1 TABLET BY MOUTH TWICE DAILY AS NEEDED -     FLUoxetine (PROZAC) 20 MG tablet; Take 3 tablets daily for mood.  Word finding difficulty -     FLUoxetine (PROZAC) 20 MG tablet; Take 3 tablets daily for mood.  Major depressive disorder, recurrent episode, moderate ([facility]) -     FLUoxetine (PROZAC) 20 MG tablet; Take 3 tablets daily for mood.  Chronic left ear  pain  Visit for screening mammogram -     MM 3D SCREEN BREAST BILATERAL  .Marland Kitchen Depression screen [facility] 2/9 11/06/2018 04/18/2018 10/19/2017 08/17/2017 05/24/2017  Decreased Interest 0 0 0 1 1  Down, Depressed, Hopeless 0 0 0 1 1  PHQ - 2 Score 0 0 0 2 2  Altered sleeping - 0 0 3 -  Tired, decreased energy - 0 0 1 -  Change in appetite - 0 0 1 -  Feeling bad or failure about yourself  - 0 0 0 -  Trouble concentrating - 0 0 0 -  Moving slowly or fidgety/restless - 0 0 1 -  Suicidal thoughts - 0 0 0 -  PHQ-9 Score - 0 0 8 -  Difficult doing work/chores - Not difficult at all - Somewhat difficult -   .. GAD 7 : Generalized Anxiety Score 11/06/2018 04/18/2018 10/19/2017 08/17/2017  Nervous, Anxious, on Edge 0 0 0 2  Control/stop worrying 0 0 0 0  Worry too much - different things 0 0 0 0  Trouble relaxing 0 0 0 0  Restless 0 0 0 0  Easily annoyed or irritable 0 0 0 0  Afraid - awful might happen 0 0 1 0  Total GAD 7 Score 0 0 1 2  Anxiety Difficulty Not difficult at all Not difficult at all - Somewhat difficult   Mood is doing great. Refilled for 6 months.  Continue to go to counseling.  Continue to exercise.   Needs mammogram. Ordered today.   Pt has follow up with ENT on Thursday for chronic left ear pain. The tympanic tube appears like it might be coming out. Continue to use flonase daily.   1st dose of shingrix given today.  Follow up in 2 months for next injection.   Follow up in 6 months.

## 2018-11-08 ENCOUNTER — Encounter: Payer: Self-pay | Admitting: Physician Assistant

## 2018-11-08 DIAGNOSIS — G8929 Other chronic pain: Secondary | ICD-10-CM | POA: Insufficient documentation

## 2018-11-08 DIAGNOSIS — Z23 Encounter for immunization: Secondary | ICD-10-CM | POA: Diagnosis not present

## 2018-11-08 DIAGNOSIS — H9202 Otalgia, left ear: Secondary | ICD-10-CM

## 2018-11-21 ENCOUNTER — Ambulatory Visit: Payer: BLUE CROSS/BLUE SHIELD | Admitting: Physician Assistant

## 2018-12-01 DIAGNOSIS — H6982 Other specified disorders of Eustachian tube, left ear: Secondary | ICD-10-CM | POA: Diagnosis not present

## 2018-12-12 ENCOUNTER — Encounter: Payer: Self-pay | Admitting: Physician Assistant

## 2018-12-12 DIAGNOSIS — R194 Change in bowel habit: Secondary | ICD-10-CM | POA: Diagnosis not present

## 2018-12-12 DIAGNOSIS — R140 Abdominal distension (gaseous): Secondary | ICD-10-CM | POA: Diagnosis not present

## 2018-12-12 DIAGNOSIS — R1030 Lower abdominal pain, unspecified: Secondary | ICD-10-CM | POA: Diagnosis not present

## 2018-12-21 DIAGNOSIS — K449 Diaphragmatic hernia without obstruction or gangrene: Secondary | ICD-10-CM | POA: Diagnosis not present

## 2018-12-21 DIAGNOSIS — R140 Abdominal distension (gaseous): Secondary | ICD-10-CM | POA: Diagnosis not present

## 2018-12-21 DIAGNOSIS — R194 Change in bowel habit: Secondary | ICD-10-CM | POA: Diagnosis not present

## 2018-12-21 DIAGNOSIS — R1030 Lower abdominal pain, unspecified: Secondary | ICD-10-CM | POA: Diagnosis not present

## 2018-12-21 DIAGNOSIS — N200 Calculus of kidney: Secondary | ICD-10-CM | POA: Diagnosis not present

## 2018-12-27 ENCOUNTER — Ambulatory Visit (INDEPENDENT_AMBULATORY_CARE_PROVIDER_SITE_OTHER): Payer: BLUE CROSS/BLUE SHIELD | Admitting: Physician Assistant

## 2018-12-27 ENCOUNTER — Encounter: Payer: Self-pay | Admitting: Physician Assistant

## 2018-12-27 ENCOUNTER — Other Ambulatory Visit: Payer: Self-pay

## 2018-12-27 VITALS — BP 118/72 | Temp 98.7°F | Ht 63.0 in | Wt 181.0 lb

## 2018-12-27 DIAGNOSIS — K449 Diaphragmatic hernia without obstruction or gangrene: Secondary | ICD-10-CM

## 2018-12-27 DIAGNOSIS — N200 Calculus of kidney: Secondary | ICD-10-CM

## 2018-12-27 DIAGNOSIS — E6609 Other obesity due to excess calories: Secondary | ICD-10-CM | POA: Insufficient documentation

## 2018-12-27 DIAGNOSIS — D259 Leiomyoma of uterus, unspecified: Secondary | ICD-10-CM | POA: Diagnosis not present

## 2018-12-27 DIAGNOSIS — Z6832 Body mass index (BMI) 32.0-32.9, adult: Secondary | ICD-10-CM

## 2018-12-27 MED ORDER — PHENTERMINE HCL 15 MG PO CAPS: 15.0000 mg | ORAL_CAPSULE | ORAL | 0 refills | Status: DC

## 2018-12-27 NOTE — Progress Notes (Signed)
Subjective:    Patient ID: Shirley Vasquez, female    DOB: 01/12/1966, 53 y.o.   MRN: [id number]  HPI  Patient is a 53 year old female with history of paraesophageal hernia, hypertension, anxiety who presents to the clinic to discuss recent CT scan.  Patient is being followed by digestive health and Dr. Nicole Kindred.  Her most recent CT scan showed that her paraesophageal hernia which was repaired May 2019 has returned.  It is much smaller than it was before.  It is approximately 3 cm.  Her digestive health team has encouraged her to lose 18 pounds before they would do surgery.  She would like my help.  She has already stopped her sweet tea and sodas.  She is trying to stay more active.  She cannot remember trying anything for weight loss medication wise in the past.  .. Active Ambulatory Problems    Diagnosis Date Noted  . GERD (gastroesophageal reflux disease) 03/15/2012  . Panic attacks 03/15/2012  . Anxiety 03/15/2012  . Hyperlipidemia 06/12/2012  . Asthma 10/18/2012  . Carpal tunnel syndrome 10/03/2013  . Greater trochanteric bursitis of both hips 04/01/2014  . Low back pain 07/31/2014  . Major depressive disorder, recurrent episode, moderate ([place]) 01/06/2015  . Stress headache 01/06/2015  . Morton's neuroma, right 3/4, left 2/3  03/13/2015  . Right cervical radiculopathy 12/25/2015  . Impaired cognition 01/29/2016  . 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 Bursitis, trochanteric 08/07/2014  . Dysfunction of eustachian tube 08/12/2015  . Elevated LDL cholesterol level 05/03/2016  . Vitamin D insufficiency 05/04/2016  . Low iron stores 11/09/2016  . IDA (iron deficiency anemia) 11/09/2016  . Word finding difficulty 12/22/2016  . Decreased ROM of neck 02/09/2017  . Neck pain 02/09/2017  . Allergy to multiple antibiotics 04/22/2017  . Multiple head injury 07/31/2017  . Memory changes 07/31/2017  . Family history of dementia 07/31/2017  . Memory loss 08/01/2017  . Vitamin D  deficiency 08/17/2017  . Hiatal hernia 10/19/2017  . Internal hemorrhoids 10/19/2017  . Diverticulosis 10/19/2017  . Vulvodynia 10/26/2017  . Urge incontinence 10/26/2017  . Paraesophageal hernia 01/11/2018  . Incisional hernia, without obstruction or gangrene 01/11/2018  . Chronic left ear pain 11/08/2018  . Class 1 obesity due to excess calories without serious comorbidity with body mass index (BMI) of 32.0 to 32.9 in adult 12/27/2018  . Uterine fibroid 12/27/2018  . Kidney stone 12/27/2018   Resolved Ambulatory Problems    Diagnosis Date Noted  . Neck muscle spasm 05/03/2013  . Poison ivy dermatitis 05/03/2013  . Greater trochanteric bursitis of right hip 07/01/2014  . Carpal tunnel syndrome, right 03/13/2015  . Klebsiella cystitis 04/22/2017   Past Medical History:  Diagnosis Date  . Anemia   . Fibroid   . Mental disorder   . Vaginal Pap smear, abnormal       Review of Systems See HPI.     Objective:   Physical Exam Vitals signs reviewed.  Constitutional:      Appearance: Normal appearance.  HENT:     Head: Normocephalic.  Cardiovascular:     Rate and Rhythm: Normal rate.  Pulmonary:     Effort: Pulmonary effort is normal.  Neurological:     General: No focal deficit present.     Mental Status: She is alert.  Psychiatric:        Mood and Affect: Mood normal.           Assessment & Plan:  Marland KitchenMarland KitchenAimee was  seen today for follow-up.  Diagnoses and all orders for this visit:  Class 1 obesity due to excess calories without serious comorbidity with body mass index (BMI) of 32.0 to 32.9 in adult -     phentermine 15 MG capsule; Take 1 capsule (15 mg total) by mouth every morning.  Paraesophageal hernia  Uterine leiomyoma, unspecified location  Kidney stone   I was able to review her CT scan results in care everywhere.  Other than a 3 cm paraesophageal hernia it did show a uterine fibroid and a nonobstructing kidney stone.  Only the hernia seems to be  bothering her.  We are going to work with digestive health to get her surgery ready.  Her goal is to lose 18 to 20 pounds before surgery.  Surgery has not been scheduled yet.  Discussed medication options.  Right now I am a little concerned about starting Saxenda due to the nausea and vomiting potential side effects.  Obviously she has problems with nausea and vomiting with her paraesophageal hernia and possible that would cause a worsening of hernia.  For now we are going to start with lower dose phentermine 15 mg.  I discussed this might be the quickest way to lose weight for her surgery.  Obviously I discussed this is not a long-term medication and is not great for long-term weight loss.  Discussed decreasing her caloric intake to around 1500 cal a day.  Encouraged regular exercise.  Patient is already drinking water and encouraged her to continue this.  Side effects of phentermine were discussed at today's visit. Follow up in one month.   Marland Kitchen.Spent 30 minutes with patient and greater than 50 percent of visit spent counseling patient regarding treatment plan.  Encounter was done via webex.

## 2019-01-08 ENCOUNTER — Ambulatory Visit: Payer: BLUE CROSS/BLUE SHIELD

## 2019-01-09 DIAGNOSIS — Z9889 Other specified postprocedural states: Secondary | ICD-10-CM | POA: Diagnosis not present

## 2019-01-09 DIAGNOSIS — K449 Diaphragmatic hernia without obstruction or gangrene: Secondary | ICD-10-CM | POA: Diagnosis not present

## 2019-01-09 DIAGNOSIS — K219 Gastro-esophageal reflux disease without esophagitis: Secondary | ICD-10-CM | POA: Diagnosis not present

## 2019-01-09 DIAGNOSIS — R140 Abdominal distension (gaseous): Secondary | ICD-10-CM | POA: Diagnosis not present

## 2019-01-23 ENCOUNTER — Telehealth: Payer: Self-pay | Admitting: Neurology

## 2019-01-23 ENCOUNTER — Other Ambulatory Visit: Payer: Self-pay | Admitting: Physician Assistant

## 2019-01-23 DIAGNOSIS — E6609 Other obesity due to excess calories: Secondary | ICD-10-CM

## 2019-01-23 DIAGNOSIS — Z6832 Body mass index (BMI) 32.0-32.9, adult: Principal | ICD-10-CM

## 2019-01-23 NOTE — Telephone Encounter (Signed)
Patient left vm that she is doing well on the dosage of her new medication and would like to continue. After reviewing chart it looks like last visit she was started on Phentermine 15 mg once daily. Last filled 12/27/2018.

## 2019-01-24 NOTE — Telephone Encounter (Signed)
Scheduled for weight and BP check

## 2019-01-25 ENCOUNTER — Encounter: Payer: Self-pay | Admitting: Physician Assistant

## 2019-01-25 ENCOUNTER — Ambulatory Visit (INDEPENDENT_AMBULATORY_CARE_PROVIDER_SITE_OTHER): Payer: BLUE CROSS/BLUE SHIELD | Admitting: Physician Assistant

## 2019-01-25 VITALS — BP 116/78 | HR 95 | Ht 63.0 in | Wt 178.0 lb

## 2019-01-25 DIAGNOSIS — E6609 Other obesity due to excess calories: Secondary | ICD-10-CM | POA: Diagnosis not present

## 2019-01-25 DIAGNOSIS — K449 Diaphragmatic hernia without obstruction or gangrene: Secondary | ICD-10-CM

## 2019-01-25 DIAGNOSIS — F419 Anxiety disorder, unspecified: Secondary | ICD-10-CM | POA: Diagnosis not present

## 2019-01-25 DIAGNOSIS — Z6831 Body mass index (BMI) 31.0-31.9, adult: Secondary | ICD-10-CM

## 2019-01-25 MED ORDER — PHENTERMINE HCL 15 MG PO CAPS: 15.0000 mg | ORAL_CAPSULE | ORAL | 0 refills | Status: DC

## 2019-01-25 NOTE — Progress Notes (Deleted)
-  phentermine RF  -lost 3 months  -feeling slight "20 seconds" of flutter heart rate, increasing water intake seems to help this   -improving diet (whole grains) and walking once a day

## 2019-01-25 NOTE — Progress Notes (Signed)
Patient ID: Shirley Vasquez, female   DOB: Dec 04, 1965, 53 y.o.   MRN: [id number] .Marland KitchenVirtual Visit via Video Note  I connected with Shirley Vasquez on 01/25/19 at 10:10 AM EDT by a video enabled telemedicine application and verified that I am speaking with the correct person using two identifiers.   I discussed the limitations of evaluation and management by telemedicine and the availability of in person appointments. The patient expressed understanding and agreed to proceed.  History of Present Illness: Pt is a 53 yo obese female MDD, anxiety, paraesophageal hernia who is following up on phentermine.   She is been taking 15mg  phentermine for one month and lost 3lbs. She is tolerating well. She did have one episode of heart fluttering for 20 seconds but resolved with hydration. She is working on her diet with less carbs and walking once a day. She would really like to lose more weight before her next GI appt in June. Likely they will have to do another hernia repair. She is hoping if she loses weight it might resolve itself. Denies any anxiety worsening.   .. Active Ambulatory Problems    Diagnosis Date Noted  . GERD (gastroesophageal reflux disease) 03/15/2012  . Panic attacks 03/15/2012  . Anxiety 03/15/2012  . Hyperlipidemia 06/12/2012  . Asthma 10/18/2012  . Carpal tunnel syndrome 10/03/2013  . Greater trochanteric bursitis of both hips 04/01/2014  . Low back pain 07/31/2014  . Major depressive disorder, recurrent episode, moderate ([place]) 01/06/2015  . Stress headache 01/06/2015  . Morton's neuroma, right 3/4, left 2/3  03/13/2015  . Right cervical radiculopathy 12/25/2015  . Impaired cognition 01/29/2016  . 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 Bursitis, trochanteric 08/07/2014  . Dysfunction of eustachian tube 08/12/2015  . Elevated LDL cholesterol level 05/03/2016  . Vitamin D insufficiency 05/04/2016  . Low iron stores 11/09/2016  . IDA (iron deficiency anemia) 11/09/2016   . Word finding difficulty 12/22/2016  . Decreased ROM of neck 02/09/2017  . Neck pain 02/09/2017  . Allergy to multiple antibiotics 04/22/2017  . Multiple head injury 07/31/2017  . Memory changes 07/31/2017  . Family history of dementia 07/31/2017  . Memory loss 08/01/2017  . Vitamin D deficiency 08/17/2017  . Hiatal hernia 10/19/2017  . Internal hemorrhoids 10/19/2017  . Diverticulosis 10/19/2017  . Vulvodynia 10/26/2017  . Urge incontinence 10/26/2017  . Paraesophageal hernia 01/11/2018  . Incisional hernia, without obstruction or gangrene 01/11/2018  . Chronic left ear pain 11/08/2018  . Class 1 obesity due to excess calories without serious comorbidity with body mass index (BMI) of 32.0 to 32.9 in adult 12/27/2018  . Uterine fibroid 12/27/2018  . Kidney stone 12/27/2018   Resolved Ambulatory Problems    Diagnosis Date Noted  . Neck muscle spasm 05/03/2013  . Poison ivy dermatitis 05/03/2013  . Greater trochanteric bursitis of right hip 07/01/2014  . Carpal tunnel syndrome, right 03/13/2015  . Klebsiella cystitis 04/22/2017   Past Medical History:  Diagnosis Date  . Anemia   . Fibroid   . Mental disorder   . Vaginal Pap smear, abnormal    Reviewed med, allergy, problem list.    Observations/Objective: No acute distress. Normal mood.   .. Today's Vitals   01/25/19 0952  BP: 116/78  Pulse: 95  Weight: 178 lb (80.7 kg)  Height: 5\' 3"  (1.6 m)   Body mass index is 31.53 kg/m.   Assessment and Plan: Marland KitchenMarland KitchenAimee was seen today for medication refill.  Diagnoses and all orders for this  visit:  Class 1 obesity due to excess calories without serious comorbidity with body mass index (BMI) of 31.0 to 31.9 in adult  Paraesophageal hernia  Anxiety   Refilled phentermine for 2 months.  Continue working on diet and exercise.  Follow up in 2 months after GI visit.   Follow Up Instructions:    I discussed the assessment and treatment plan with the patient. The  patient was provided an opportunity to ask questions and all were answered. The patient agreed with the plan and demonstrated an understanding of the instructions.   The patient was advised to call back or seek an in-person evaluation if the symptoms worsen or if the condition fails to improve as anticipated.  I provided 15 minutes of non-face-to-face time during this encounter.   Iran Planas, PA-C

## 2019-02-07 ENCOUNTER — Ambulatory Visit (INDEPENDENT_AMBULATORY_CARE_PROVIDER_SITE_OTHER): Payer: BLUE CROSS/BLUE SHIELD | Admitting: Physician Assistant

## 2019-02-07 VITALS — BP 126/77 | HR 84 | Temp 97.6°F

## 2019-02-07 DIAGNOSIS — Z23 Encounter for immunization: Secondary | ICD-10-CM | POA: Diagnosis not present

## 2019-02-07 NOTE — Progress Notes (Signed)
Pt came into clinic today for second Shingrix vaccine. Reports no negative side effects from first vaccine. Tolerated injection today in right deltoid well, no immediate complications.   Agree with above plan. Iran Planas PA-C

## 2019-02-09 ENCOUNTER — Encounter: Payer: Self-pay | Admitting: Sports Medicine

## 2019-02-09 ENCOUNTER — Ambulatory Visit (INDEPENDENT_AMBULATORY_CARE_PROVIDER_SITE_OTHER): Payer: BLUE CROSS/BLUE SHIELD | Admitting: Sports Medicine

## 2019-02-09 DIAGNOSIS — T7840XA Allergy, unspecified, initial encounter: Secondary | ICD-10-CM

## 2019-02-09 DIAGNOSIS — T887XXA Unspecified adverse effect of drug or medicament, initial encounter: Secondary | ICD-10-CM

## 2019-02-09 DIAGNOSIS — T8089XA Other complications following infusion, transfusion and therapeutic injection, initial encounter: Secondary | ICD-10-CM | POA: Insufficient documentation

## 2019-02-09 NOTE — Patient Instructions (Signed)
This is benign and self-limited, it is not an infection. Because we do want her to develop an immune response to the Shingrix vaccine I am going to avoid steroids. She can use topical ice packs, over-the-counter analgesics, as well as Benadryl. Regimen will be Claritin 1 pill daily, Benadryl 25 mg 2-3 times daily. Ibuprofen or Aleve over-the-counter as needed for pain. Return as needed for this.   Post-Injection Inflammatory Reaction  A post-injection inflammatory reaction is swelling, irritation, and other problems that can develop after a person gets a shot (injection). The reaction can develop at and around the injection site or far away from the injection site. In some cases, it can develop right away and last for a short period of time. In other cases, it can develop weeks after the injection and last for several hours or days. What are the causes? This condition may be caused by:  An allergy.  A response by your body's defense system (immune response).  Damage to the surrounding tissue from the injection.  Germs that enter the body through the injection site. What are the signs or symptoms? Symptoms of this condition include:  Itchiness.  Redness.  Rash.  Warmth.  Swelling.  Tenderness.  Pain.  Fever or chills.  Muscle aches.  Nausea or vomiting.  Loss of appetite.  Headache.  Dizziness. In severe cases, seizures, asthma, or a severe allergic reaction (anaphylaxis) can occur. These cases are rare. How is this diagnosed? This condition may be diagnosed with a physical exam. During the exam, a circle may be drawn around the injection site. The circle helps to show whether redness in the area is spreading. How is this treated? Treatment for this condition depends on what caused the reaction and how severe the reaction is. Treatments commonly involves:  Putting an ice pack over the injection site.  Taking a nonsteroidal anti-inflammatory drug (NSAID) to  reduce swelling and itching.  Taking an antibiotic medicine.  Taking pain medicine. If the reaction affects a joint, you may also need to rest that joint for a while. Follow these instructions at home:  Keep the injection site clean.  Take over-the-counter and prescription medicines only as told by your health care provider.  If you were prescribed antibiotic medicine, take or apply it as told by your health care provider. Do not stop taking or applying the antibiotic even if your condition improves.  If directed, apply ice to the injured area: ? Put ice in a plastic bag. ? Place a towel between your skin and the bag. ? Leave the ice on for 20 minutes, 2-3 times per day.  If the reaction affects a joint, rest your joint. Ask your health care provider when you can start to use your joint again.  Do not drive or operate heavy machinery while taking prescription pain medicine.  Keep all follow-up visits as told by your health care provider. This is important. Contact a health care provider if:  Your symptoms last for several hours.  You develop a fever or chills.  You develop muscle aches. Get help right away if:  Your symptoms get worse.  You have trouble breathing.  You have seizures. This information is not intended to replace advice given to you by your health care provider. Make sure you discuss any questions you have with your health care provider. Document Released: 06/02/2011 Document Revised: 02/26/2016 Document Reviewed: 04/02/2015 Elsevier Interactive Patient Education  2019 [employer].

## 2019-02-09 NOTE — Progress Notes (Signed)
Subjective:    CC: Left-sided arm pain  HPI: This is a pleasant 53 year old female, 2 days ago she had her Shingrix vaccine in the right deltoid.  Afterwards she developed an itchy, painful rash just distal to the injection site.  No fevers, chills, no muscle aches, body aches.  It is more itchy than painful.  I reviewed the past medical history, family history, social history, surgical history, and allergies today and no changes were needed.  Please see the problem list section below in epic for further details.  Past Medical History: Past Medical History:  Diagnosis Date  . Anemia   . Anxiety   . Asthma   . Fibroid    Uterine ablation  . GERD (gastroesophageal reflux disease)   . Mental disorder   . Vaginal Pap smear, abnormal    Past Surgical History: Past Surgical History:  Procedure Laterality Date  . BLADDER SUSPENSION    . cervical cryo    . CHOLECYSTECTOMY    . DILATION AND CURETTAGE OF UTERUS     after each SAB  . TONSILLECTOMY AND ADENOIDECTOMY    . TUBAL LIGATION    . uternine ablasion    . WISDOM TOOTH EXTRACTION     Social History: Social History   Socioeconomic History  . Marital status: Married    Spouse name: Not on file  . Number of children: Not on file  . Years of education: Not on file  . Highest education level: Not on file  Occupational History  . Occupation: homemaker  Social Needs  . Financial resource strain: Not on file  . Food insecurity:    Worry: Not on file    Inability: Not on file  . Transportation needs:    Medical: Not on file    Non-medical: Not on file  Tobacco Use  . Smoking status: Never Smoker  . Smokeless tobacco: Never Used  Substance and Sexual Activity  . Alcohol use: No  . Drug use: No  . Sexual activity: Yes    Partners: Male    Birth control/protection: Surgical  Lifestyle  . Physical activity:    Days per week: Not on file    Minutes per session: Not on file  . Stress: Not on file  Relationships  .  Social connections:    Talks on phone: Not on file    Gets together: Not on file    Attends religious service: Not on file    Active member of club or organization: Not on file    Attends meetings of clubs or organizations: Not on file    Relationship status: Not on file  Other Topics Concern  . Not on file  Social History Narrative  . Not on file   Family History: Family History  Problem Relation Age of Onset  . Cancer Mother        breast Tested Neg for [facility]  . Heart failure Mother   . Hypertension Mother   . Heart attack Mother   . Breast cancer Mother 59  . Dementia Father   . Depression Father   . Bipolar disorder Father   . Personality disorder Father   . Frontotemporal dementia Father   . Breast cancer Maternal Grandmother   . Breast cancer Maternal Aunt   . Ovarian cancer Maternal Aunt   . Breast cancer Maternal Aunt    Allergies: Allergies  Allergen Reactions  . Amoxicillin Hives  . Augmentin [Amoxicillin-Pot Clavulanate] Other (See Comments)    Irritates  upper and lower GI system  . Buprenorphine Hcl Other (See Comments)    Over-sedates; hard to breathe well in PACU after surgery  . Codeine Nausea And Vomiting  . Hctz [Hydrochlorothiazide] Hives  . Keflex [Cephalexin] Rash  . Levaquin [Levofloxacin In D5w] Rash  . Morphine And Related Other (See Comments)    Over-sedates; hard to breathe well in PACU after surgery  . Sulfa Antibiotics Hives  . Sulfacetamide Sodium Hives  . Wellbutrin [Bupropion] Rash   Medications: See med rec.  Review of Systems: No fevers, chills, night sweats, weight loss, chest pain, or shortness of breath.   Objective:    General: Well Developed, well nourished, and in no acute distress.  Neuro: Alert and oriented x3, extra-ocular muscles intact, sensation grossly intact.  HEENT: Normocephalic, atraumatic, pupils equal round reactive to light, neck supple, no masses, no lymphadenopathy, thyroid nonpalpable.  Skin: Warm and dry,  no rashes. Cardiac: Regular rate and rhythm, no murmurs rubs or gallops, no lower extremity edema.  Respiratory: Clear to auscultation bilaterally. Not using accessory muscles, speaking in full sentences.    Impression and Recommendations:    Hypersensitivity reaction at injection site This is benign and self-limited, it is not an infection. Because we do want her to develop an immune response to the Shingrix vaccine I am going to avoid steroids. She can use topical ice packs, over-the-counter analgesics, as well as Benadryl. Regimen will be Claritin 1 pill daily, Benadryl 25 mg 2-3 times daily. Ibuprofen or Aleve over-the-counter as needed for pain. Return as needed for this.   ___________________________________________ Gwen Her. Dianah Field, M.D., ABFM., CAQSM. Primary Care and Sports Medicine [facility]  Adjunct Professor of [employer]

## 2019-02-09 NOTE — Assessment & Plan Note (Addendum)
This is benign and self-limited, it is not an infection. Because we do want her to develop an immune response to the Shingrix vaccine I am going to avoid steroids. She can use topical ice packs, over-the-counter analgesics, as well as Benadryl. Regimen will be Claritin 1 pill daily, Benadryl 25 mg 2-3 times daily. Ibuprofen or Aleve over-the-counter as needed for pain. Return as needed for this.

## 2019-03-08 DIAGNOSIS — K449 Diaphragmatic hernia without obstruction or gangrene: Secondary | ICD-10-CM | POA: Diagnosis not present

## 2019-03-09 DIAGNOSIS — K449 Diaphragmatic hernia without obstruction or gangrene: Secondary | ICD-10-CM | POA: Diagnosis not present

## 2019-03-13 DIAGNOSIS — H6982 Other specified disorders of Eustachian tube, left ear: Secondary | ICD-10-CM | POA: Diagnosis not present

## 2019-03-21 ENCOUNTER — Ambulatory Visit (INDEPENDENT_AMBULATORY_CARE_PROVIDER_SITE_OTHER): Payer: BC Managed Care – PPO | Admitting: Physician Assistant

## 2019-03-21 ENCOUNTER — Encounter: Payer: Self-pay | Admitting: Physician Assistant

## 2019-03-21 VITALS — BP 131/80 | HR 83 | Temp 98.3°F | Ht 63.0 in | Wt 179.0 lb

## 2019-03-21 DIAGNOSIS — Z6831 Body mass index (BMI) 31.0-31.9, adult: Secondary | ICD-10-CM

## 2019-03-21 DIAGNOSIS — R4789 Other speech disturbances: Secondary | ICD-10-CM

## 2019-03-21 DIAGNOSIS — K449 Diaphragmatic hernia without obstruction or gangrene: Secondary | ICD-10-CM | POA: Diagnosis not present

## 2019-03-21 DIAGNOSIS — F419 Anxiety disorder, unspecified: Secondary | ICD-10-CM | POA: Diagnosis not present

## 2019-03-21 DIAGNOSIS — E6609 Other obesity due to excess calories: Secondary | ICD-10-CM

## 2019-03-21 DIAGNOSIS — F331 Major depressive disorder, recurrent, moderate: Secondary | ICD-10-CM

## 2019-03-21 MED ORDER — FLUOXETINE HCL 20 MG PO TABS: ORAL_TABLET | ORAL | 1 refills | Status: DC

## 2019-03-21 MED ORDER — SAXENDA 18 MG/3ML ~~LOC~~ SOPN: 0.6000 mg | PEN_INJECTOR | Freq: Every day | SUBCUTANEOUS | 1 refills | Status: DC

## 2019-03-21 MED ORDER — BUSPIRONE HCL 7.5 MG PO TABS: ORAL_TABLET | ORAL | 1 refills | Status: DC

## 2019-03-21 MED ORDER — AMBULATORY NON FORMULARY MEDICATION: 0 refills | Status: DC

## 2019-03-21 MED ORDER — CLONAZEPAM 1 MG PO TABS: ORAL_TABLET | ORAL | 2 refills | Status: DC

## 2019-03-21 MED ORDER — PANTOPRAZOLE SODIUM 40 MG PO TBEC: 40.0000 mg | DELAYED_RELEASE_TABLET | Freq: Two times a day (BID) | ORAL | 3 refills | Status: DC

## 2019-03-21 NOTE — Progress Notes (Signed)
Subjective:    Patient ID: Shirley Vasquez, female    DOB: 1966-03-05, 53 y.o.   MRN: [id number]  HPI  Pt is a 53 yo obese female with paraesophageal hernia, anxiety, depression, GERD who is here to follow up on weight.   She was put on phentermine to see if we could get to goal weight and would help symptoms of hernia. She has lost 1lb in one month and that has been consistent for last 52months. She is trying to watch portion sizes and what she eats. She had hernia repair may 2019. She continues to have hernia symptoms but Dr. Nicole Vasquez does not feel like she needs surgery. She is on protonix. At least once a every 2 weeks she has severe belching, vomiting. She does notice her food triggers with fried foods or larger portions. She had a recent EGD series that did show some mesh coming back through diaphram per patient. She would just like someone else to look at it.   Her anxiety and depression are good.   .. Active Ambulatory Problems    Diagnosis Date Noted  . GERD (gastroesophageal reflux disease) 03/15/2012  . Panic attacks 03/15/2012  . Anxiety 03/15/2012  . Hyperlipidemia 06/12/2012  . Asthma 10/18/2012  . Carpal tunnel syndrome 10/03/2013  . Greater trochanteric bursitis of both hips 04/01/2014  . Low back pain 07/31/2014  . Major depressive disorder, recurrent episode, moderate ([place]) 01/06/2015  . Stress headache 01/06/2015  . Morton's neuroma, right 3/4, left 2/3  03/13/2015  . Right cervical radiculopathy 12/25/2015  . Impaired cognition 01/29/2016  . 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 Bursitis, trochanteric 08/07/2014  . Dysfunction of eustachian tube 08/12/2015  . Elevated LDL cholesterol level 05/03/2016  . Vitamin D insufficiency 05/04/2016  . Low iron stores 11/09/2016  . IDA (iron deficiency anemia) 11/09/2016  . Word finding difficulty 12/22/2016  . Decreased ROM of neck 02/09/2017  . Neck pain 02/09/2017  . Allergy to multiple antibiotics 04/22/2017  .  Multiple head injury 07/31/2017  . Memory changes 07/31/2017  . Family history of dementia 07/31/2017  . Memory loss 08/01/2017  . Vitamin D deficiency 08/17/2017  . Hiatal hernia 10/19/2017  . Internal hemorrhoids 10/19/2017  . Diverticulosis 10/19/2017  . Vulvodynia 10/26/2017  . Urge incontinence 10/26/2017  . Paraesophageal hernia 01/11/2018  . Incisional hernia, without obstruction or gangrene 01/11/2018  . Chronic left ear pain 11/08/2018  . Class 1 obesity due to excess calories without serious comorbidity with body mass index (BMI) of 31.0 to 31.9 in adult 12/27/2018  . Uterine fibroid 12/27/2018  . Kidney stone 12/27/2018  . Hypersensitivity reaction at injection site 02/09/2019   Resolved Ambulatory Problems    Diagnosis Date Noted  . Neck muscle spasm 05/03/2013  . Poison ivy dermatitis 05/03/2013  . Greater trochanteric bursitis of right hip 07/01/2014  . Carpal tunnel syndrome, right 03/13/2015  . Klebsiella cystitis 04/22/2017   Past Medical History:  Diagnosis Date  . Anemia   . Fibroid   . Mental disorder   . Vaginal Pap smear, abnormal      Review of Systems  All other systems reviewed and are negative.      Objective:   Physical Exam Vitals signs reviewed.  Constitutional:      Appearance: Normal appearance.  Cardiovascular:     Rate and Rhythm: Normal rate and regular rhythm.  Pulmonary:     Effort: Pulmonary effort is normal.  Abdominal:     General:  Bowel sounds are normal. There is distension.     Palpations: Abdomen is soft. There is no mass.     Tenderness: There is no abdominal tenderness.  Neurological:     General: No focal deficit present.     Mental Status: She is alert and oriented to person, place, and time.  Psychiatric:        Mood and Affect: Mood normal.           Assessment & Plan:  Marland KitchenMarland KitchenAimee was seen today for weight check and medication refill.  Diagnoses and all orders for this visit:  Paraesophageal hernia -      pantoprazole (PROTONIX) 40 MG tablet; Take 1 tablet (40 mg total) by mouth 2 (two) times daily before a meal. -     Ambulatory referral to Gastroenterology  Anxiety -     busPIRone (BUSPAR) 7.5 MG tablet; TAKE 1 TABLET(7.5 MG) BY MOUTH TWICE DAILY -     FLUoxetine (PROZAC) 20 MG tablet; Take 3 tablets daily for mood. -     Discontinue: clonazePAM (KLONOPIN) 1 MG tablet; TAKE 1 TABLET BY MOUTH TWICE DAILY AS NEEDED  Word finding difficulty -     FLUoxetine (PROZAC) 20 MG tablet; Take 3 tablets daily for mood.  Major depressive disorder, recurrent episode, moderate ([facility]) -     FLUoxetine (PROZAC) 20 MG tablet; Take 3 tablets daily for mood.  Class 1 obesity due to excess calories without serious comorbidity with body mass index (BMI) of 31.0 to 31.9 in adult -     Liraglutide -Weight Management (SAXENDA) 18 MG/3ML SOPN; Inject 0.6 mg into the skin daily. For one week then increase by .6mg  weekly until reaches 3mg  daily.  Please include ultra fine needles 16mm  Other orders -     AMBULATORY NON FORMULARY MEDICATION; novofine 59mm needles to go with saxenda pen.  .. Depression screen [facility] 2/9 03/21/2019 11/06/2018 04/18/2018 10/19/2017 08/17/2017  Decreased Interest 0 0 0 0 1  Down, Depressed, Hopeless 0 0 0 0 1  PHQ - 2 Score 0 0 0 0 2  Altered sleeping 0 - 0 0 3  Tired, decreased energy 0 - 0 0 1  Change in appetite 0 - 0 0 1  Feeling bad or failure about yourself  0 - 0 0 0  Trouble concentrating 0 - 0 0 0  Moving slowly or fidgety/restless 0 - 0 0 1  Suicidal thoughts 0 - 0 0 0  PHQ-9 Score 0 - 0 0 8  Difficult doing work/chores Not difficult at all - Not difficult at all - Somewhat difficult   .Marland Kitchen GAD 7 : Generalized Anxiety Score 03/21/2019 11/06/2018 04/18/2018 10/19/2017  Nervous, Anxious, on Edge 0 0 0 0  Control/stop worrying 0 0 0 0  Worry too much - different things 0 0 0 0  Trouble relaxing 0 0 0 0  Restless 0 0 0 0  Easily annoyed or irritable 0 0 0 0  Afraid - awful might  happen 0 0 0 1  Total GAD 7 Score 0 0 0 1  Anxiety Difficulty Not difficult at all Not difficult at all Not difficult at all -    will make referral for 2nd opinion.   Refills sent.   Marland Kitchen.Discussed low carb diet with 1500 calories and 80g of protein.  Exercising at least 150 minutes a week.  [employer] could be a Microbiologist.  Phentermine just not getting great results. Stop phentermine. Start saxenda. Discussed  side effects.  Discussed how to titrate up to 3mg  daily.  Follow up in 1 month.

## 2019-03-23 ENCOUNTER — Other Ambulatory Visit: Payer: Self-pay | Admitting: Neurology

## 2019-03-23 DIAGNOSIS — F419 Anxiety disorder, unspecified: Secondary | ICD-10-CM

## 2019-03-23 NOTE — Telephone Encounter (Signed)
[facility] left vm needing supervising MD DEA number on Clonazepam RX.   Order pended. Please resend.

## 2019-03-26 MED ORDER — CLONAZEPAM 1 MG PO TABS: ORAL_TABLET | ORAL | 2 refills | Status: DC

## 2019-03-27 ENCOUNTER — Telehealth: Payer: Self-pay | Admitting: Neurology

## 2019-03-27 NOTE — Telephone Encounter (Signed)
Received vm from patient stating her insurance is not covering new medication. After reviewing chart, it looks like Saxenda was just prescribed.   Barnet Pall - are you working on prior authorization for patient?   Patient also was checking on referral to GI.   Jenny Reichmann - it looks like Financial controller GI wrote note in referral that stated it needs sent to [facility], can you touch base with patient and let her know where referral is in the process?

## 2019-03-28 NOTE — Telephone Encounter (Signed)
Patient called again, I informed her I have forwarded her requests. Please follow up with patient.

## 2019-03-28 NOTE — Telephone Encounter (Signed)
Called patient to let her know that I am working the [place] for [employer]. Patient was appreciative of the call. I let her know that it can take at least 10 business days and she voices understanding.

## 2019-03-28 NOTE — Telephone Encounter (Signed)
Received fax from insurance that Shirley Vasquez is a plan exclusion. It does not have this medication on the plan. Do you want to see if she may want to try Contrave? Please advise.

## 2019-03-28 NOTE — Telephone Encounter (Signed)
She has a hx of rash with wellbutrin and that is what is in contrave.

## 2019-03-30 ENCOUNTER — Encounter: Payer: Self-pay | Admitting: Physician Assistant

## 2019-03-30 ENCOUNTER — Ambulatory Visit (INDEPENDENT_AMBULATORY_CARE_PROVIDER_SITE_OTHER): Payer: BC Managed Care – PPO | Admitting: Physician Assistant

## 2019-03-30 VITALS — BP 128/69 | HR 88 | Temp 98.0°F | Ht 63.0 in | Wt 180.0 lb

## 2019-03-30 DIAGNOSIS — J320 Chronic maxillary sinusitis: Secondary | ICD-10-CM

## 2019-03-30 DIAGNOSIS — H6505 Acute serous otitis media, recurrent, left ear: Secondary | ICD-10-CM

## 2019-03-30 MED ORDER — AZITHROMYCIN 250 MG PO TABS: ORAL_TABLET | ORAL | 0 refills | Status: DC

## 2019-03-30 NOTE — Progress Notes (Signed)
Subjective:    Patient ID: Shirley Vasquez, female    DOB: [birth year], 53 y.o.   MRN: [id number]  HPI  Pt is a 53 yo female who presents to the clinic with left sided sinus pressure, frontal headache, left ear pain for the last week but worsening over the last day. Pt has hx of recurrent ear infections and has tympanostomy tubes placed. No fever, chills, cough, chills. She admits to blowing some green sputum out. She has not tried anything to make better.   .. Active Ambulatory Problems    Diagnosis Date Noted  . GERD (gastroesophageal reflux disease) 03/15/2012  . Panic attacks 03/15/2012  . Anxiety 03/15/2012  . Hyperlipidemia 06/12/2012  . Asthma 10/18/2012  . Carpal tunnel syndrome 10/03/2013  . Greater trochanteric bursitis of both hips 04/01/2014  . Low back pain 07/31/2014  . Major depressive disorder, recurrent episode, moderate ([place]) 01/06/2015  . Stress headache 01/06/2015  . Morton's neuroma, right 3/4, left 2/3  03/13/2015  . Right cervical radiculopathy 12/25/2015  . Impaired cognition 01/29/2016  . 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 Bursitis, trochanteric 08/07/2014  . Dysfunction of eustachian tube 08/12/2015  . Elevated LDL cholesterol level 05/03/2016  . Vitamin D insufficiency 05/04/2016  . Low iron stores 11/09/2016  . IDA (iron deficiency anemia) 11/09/2016  . Word finding difficulty 12/22/2016  . Decreased ROM of neck 02/09/2017  . Neck pain 02/09/2017  . Allergy to multiple antibiotics 04/22/2017  . Multiple head injury 07/31/2017  . Memory changes 07/31/2017  . Family history of dementia 07/31/2017  . Memory loss 08/01/2017  . Vitamin D deficiency 08/17/2017  . Hiatal hernia 10/19/2017  . Internal hemorrhoids 10/19/2017  . Diverticulosis 10/19/2017  . Vulvodynia 10/26/2017  . Urge incontinence 10/26/2017  . Paraesophageal hernia 01/11/2018  . Incisional hernia, without obstruction or gangrene 01/11/2018  . Chronic left ear pain  11/08/2018  . Class 1 obesity due to excess calories without serious comorbidity with body mass index (BMI) of 31.0 to 31.9 in adult 12/27/2018  . Uterine fibroid 12/27/2018  . Kidney stone 12/27/2018  . Hypersensitivity reaction at injection site 02/09/2019   Resolved Ambulatory Problems    Diagnosis Date Noted  . Neck muscle spasm 05/03/2013  . Poison ivy dermatitis 05/03/2013  . Greater trochanteric bursitis of right hip 07/01/2014  . Carpal tunnel syndrome, right 03/13/2015  . Klebsiella cystitis 04/22/2017   Past Medical History:  Diagnosis Date  . Anemia   . Fibroid   . Mental disorder   . Vaginal Pap smear, abnormal      Review of Systems See HPI.     Objective:   Physical Exam Vitals signs reviewed.  Constitutional:      Appearance: Normal appearance.  HENT:     Head: Normocephalic.     Comments: Pressure to palpation over left frontal sinuses and left maxillary sinuses.     Right Ear: Tympanic membrane and ear canal normal.     Left Ear: Ear canal normal.     Ears:     Comments: Left TM tube present with dull TM and appearance of pus behind TM.     Nose: Congestion present.     Mouth/Throat:     Mouth: Mucous membranes are moist.  Eyes:     General:        Right eye: No discharge.        Left eye: No discharge.     Conjunctiva/sclera: Conjunctivae normal.  Pupils: Pupils are equal, round, and reactive to light.  Cardiovascular:     Rate and Rhythm: Normal rate.     Pulses: Normal pulses.  Pulmonary:     Effort: Pulmonary effort is normal.     Breath sounds: Normal breath sounds.  Neurological:     General: No focal deficit present.     Mental Status: She is alert.  Psychiatric:        Mood and Affect: Mood normal.           Assessment & Plan:  Marland KitchenMarland KitchenAimee was seen today for ear pain.  Diagnoses and all orders for this visit:  Left maxillary sinusitis -     azithromycin (ZITHROMAX) 250 MG tablet; Take 2 tablets now and then one tablet for  4 days.  Recurrent acute serous otitis media of left ear -     azithromycin (ZITHROMAX) 250 MG tablet; Take 2 tablets now and then one tablet for 4 days.   Allergic to PCN. Sent zpak. Follow up as needed.

## 2019-03-30 NOTE — Patient Instructions (Signed)

## 2019-04-02 NOTE — Telephone Encounter (Signed)
Received a fax from insurance that the appeal for Shirley Vasquez has been denied. This is plan exclusion and will not be covered at this time. Please advise.

## 2019-04-02 NOTE — Telephone Encounter (Signed)
Left brief VM that I am working on the appeal for the patient. Patient was asked to call back with any questions.

## 2019-04-03 NOTE — Telephone Encounter (Signed)
Please discuss insurance not paying with patient. The only thing we can do is low dose phentermine(which was not overtly effective recently) and topamax?   We could also make referral to an obesity medicine clinic just to see if working more with nutrition and education could help.

## 2019-04-04 DIAGNOSIS — Z9189 Other specified personal risk factors, not elsewhere classified: Secondary | ICD-10-CM | POA: Diagnosis not present

## 2019-04-04 DIAGNOSIS — Z803 Family history of malignant neoplasm of breast: Secondary | ICD-10-CM | POA: Diagnosis not present

## 2019-04-04 DIAGNOSIS — Z1239 Encounter for other screening for malignant neoplasm of breast: Secondary | ICD-10-CM | POA: Diagnosis not present

## 2019-04-04 DIAGNOSIS — R922 Inconclusive mammogram: Secondary | ICD-10-CM | POA: Diagnosis not present

## 2019-04-04 DIAGNOSIS — Z1231 Encounter for screening mammogram for malignant neoplasm of breast: Secondary | ICD-10-CM | POA: Diagnosis not present

## 2019-04-05 NOTE — Telephone Encounter (Signed)
Attempted to call patient and phone went static and will try again later.

## 2019-04-05 NOTE — Telephone Encounter (Signed)
Left brief VM about note below and for patient to call and let us know the next steps she would like to take.

## 2019-04-09 DIAGNOSIS — R1032 Left lower quadrant pain: Secondary | ICD-10-CM | POA: Diagnosis not present

## 2019-04-09 DIAGNOSIS — R1030 Lower abdominal pain, unspecified: Secondary | ICD-10-CM | POA: Diagnosis not present

## 2019-04-09 DIAGNOSIS — R197 Diarrhea, unspecified: Secondary | ICD-10-CM | POA: Diagnosis not present

## 2019-04-11 DIAGNOSIS — Z9049 Acquired absence of other specified parts of digestive tract: Secondary | ICD-10-CM | POA: Diagnosis not present

## 2019-04-11 DIAGNOSIS — R109 Unspecified abdominal pain: Secondary | ICD-10-CM | POA: Diagnosis not present

## 2019-04-11 DIAGNOSIS — K760 Fatty (change of) liver, not elsewhere classified: Secondary | ICD-10-CM | POA: Diagnosis not present

## 2019-04-11 DIAGNOSIS — R112 Nausea with vomiting, unspecified: Secondary | ICD-10-CM | POA: Diagnosis not present

## 2019-04-11 DIAGNOSIS — N200 Calculus of kidney: Secondary | ICD-10-CM | POA: Diagnosis not present

## 2019-04-11 DIAGNOSIS — K449 Diaphragmatic hernia without obstruction or gangrene: Secondary | ICD-10-CM | POA: Diagnosis not present

## 2019-04-11 NOTE — Telephone Encounter (Signed)
Patient has not called back. Closing encounter. 

## 2019-04-13 ENCOUNTER — Other Ambulatory Visit: Payer: Self-pay | Admitting: Physician Assistant

## 2019-04-13 DIAGNOSIS — K449 Diaphragmatic hernia without obstruction or gangrene: Secondary | ICD-10-CM

## 2019-04-18 ENCOUNTER — Telehealth: Payer: Self-pay | Admitting: Neurology

## 2019-04-18 DIAGNOSIS — E6609 Other obesity due to excess calories: Secondary | ICD-10-CM

## 2019-04-18 DIAGNOSIS — Z6831 Body mass index (BMI) 31.0-31.9, adult: Secondary | ICD-10-CM

## 2019-04-18 NOTE — Telephone Encounter (Signed)
Patient made aware referral sent.   She also wanted Galilea Quito to know she did just have CT abd/pelvis at [place] (in [facility]).   FYI.

## 2019-04-18 NOTE — Telephone Encounter (Signed)
Signed.

## 2019-04-18 NOTE — Telephone Encounter (Signed)
Patient left vm asking for a referral to a nutritionist in network with her insurance since she is in the appeal process for weight loss medicine. If agreeable please sign referral.

## 2019-04-24 DIAGNOSIS — Z1231 Encounter for screening mammogram for malignant neoplasm of breast: Secondary | ICD-10-CM | POA: Diagnosis not present

## 2019-04-25 DIAGNOSIS — F330 Major depressive disorder, recurrent, mild: Secondary | ICD-10-CM | POA: Diagnosis not present

## 2019-04-25 DIAGNOSIS — F411 Generalized anxiety disorder: Secondary | ICD-10-CM | POA: Diagnosis not present

## 2019-05-04 LAB — HM MAMMOGRAPHY

## 2019-05-07 ENCOUNTER — Ambulatory Visit: Payer: BLUE CROSS/BLUE SHIELD | Admitting: Physician Assistant

## 2019-05-13 IMAGING — MR MR ANKLE*R* W/O CM
4 of 6 series · 15 of 40 positions shown · non-contrast
Comparison: None.

CLINICAL DATA: Right ankle pain and throbbing since the patient
slipped on water and ice 12/22/2015. Pain is worse with
weight-bearing.

EXAM:
MRI OF THE RIGHT ANKLE WITHOUT CONTRAST
TECHNIQUE: Multiplanar, multisequence MR imaging of the ankle was performed. No
intravenous contrast was administered.

[Series 3: PD fat-sat · axial · 4.0mm · 0.25mm/px · z∈[-77,+28]mm · 6 of 28 slices shown]
[im 1/28]
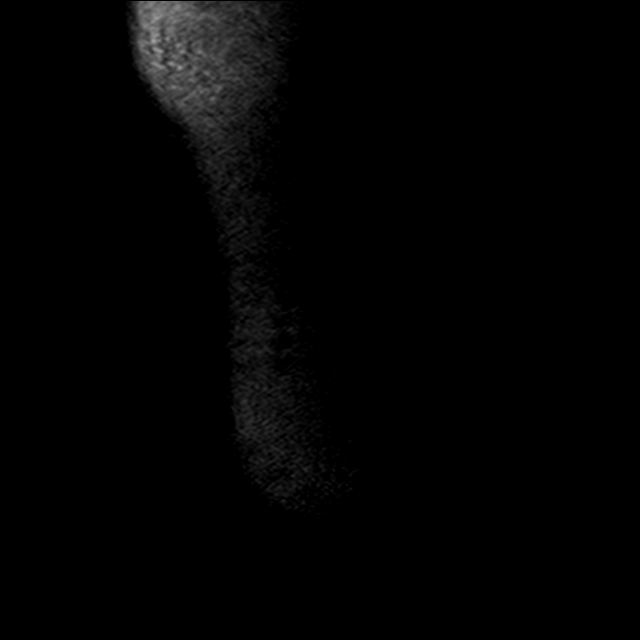
[im 4/28]
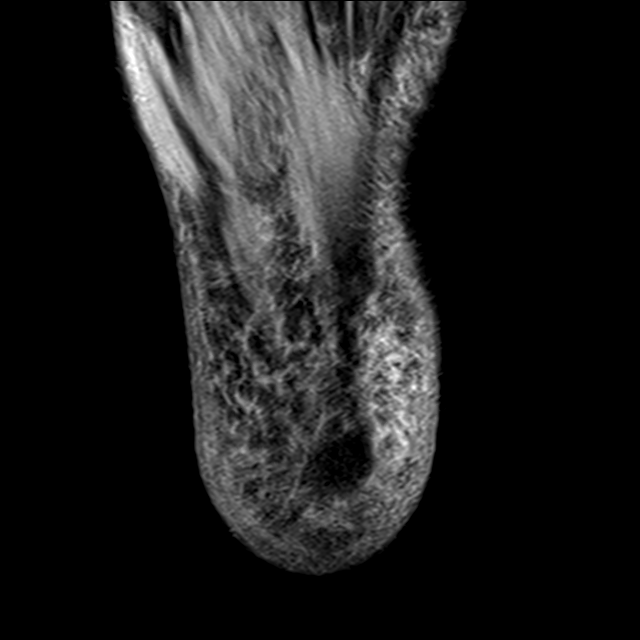
[im 8/28]
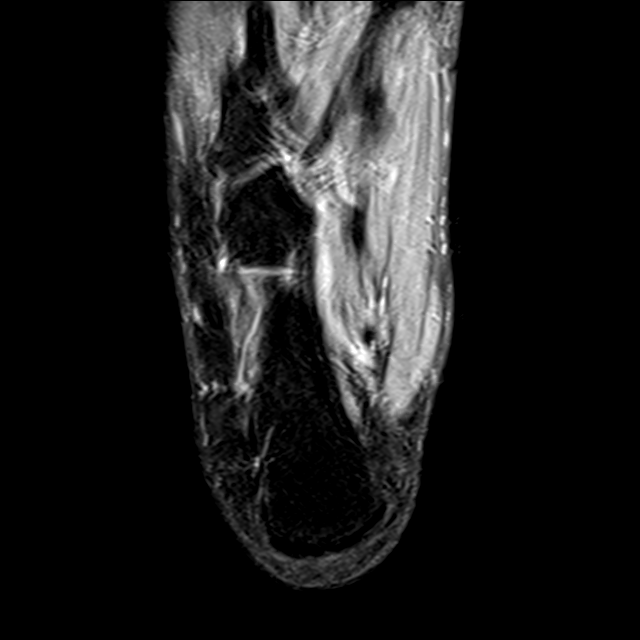
[im 12/28]
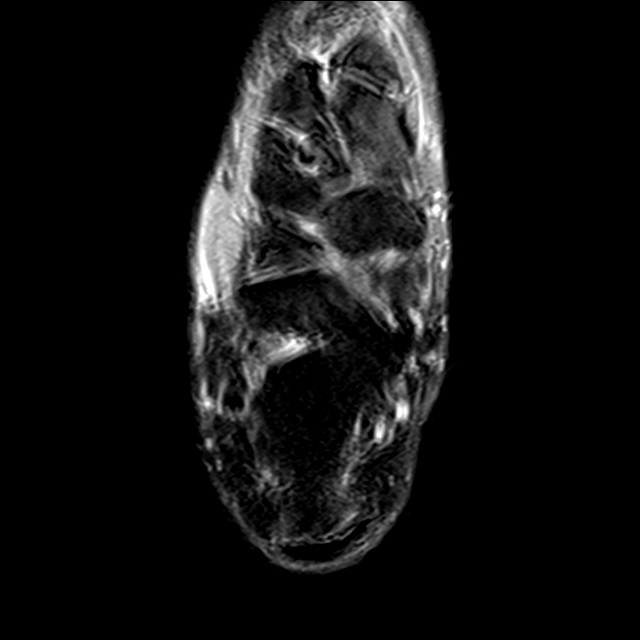
[im 16/28]
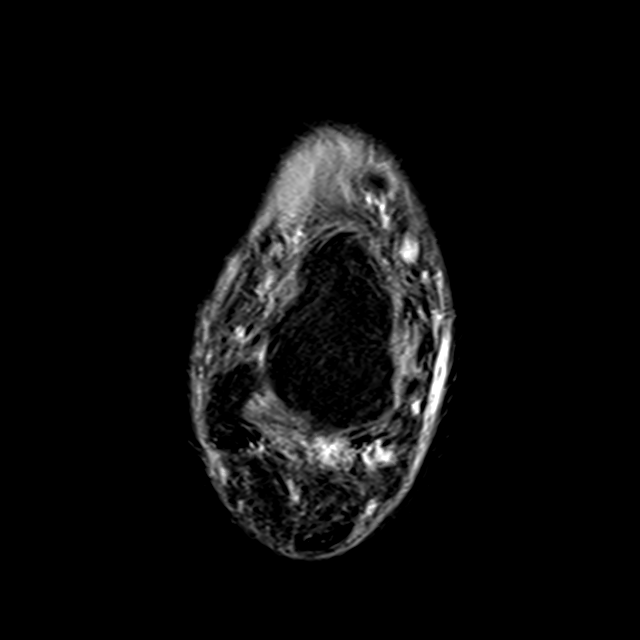
[im 24/28]
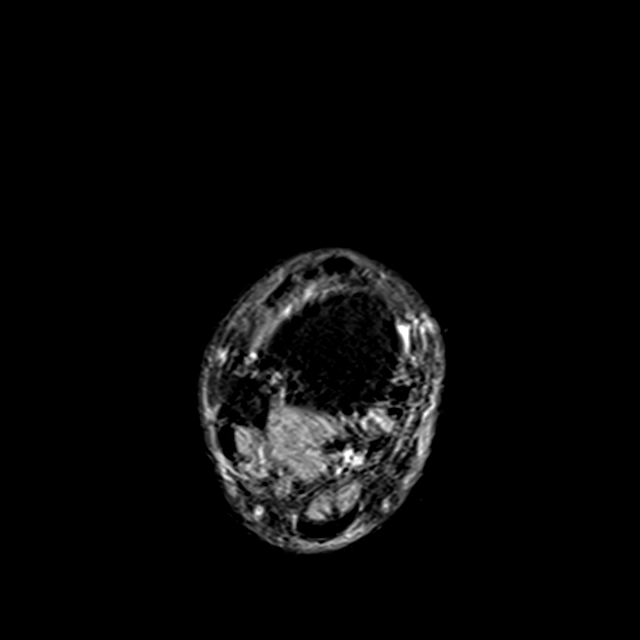

[Series 4: T2 fat-sat · axial · 4.0mm · 0.28mm/px · z∈[-59,+23]mm · 3 of 28 slices shown (1 of 3)]
[im 5/28]
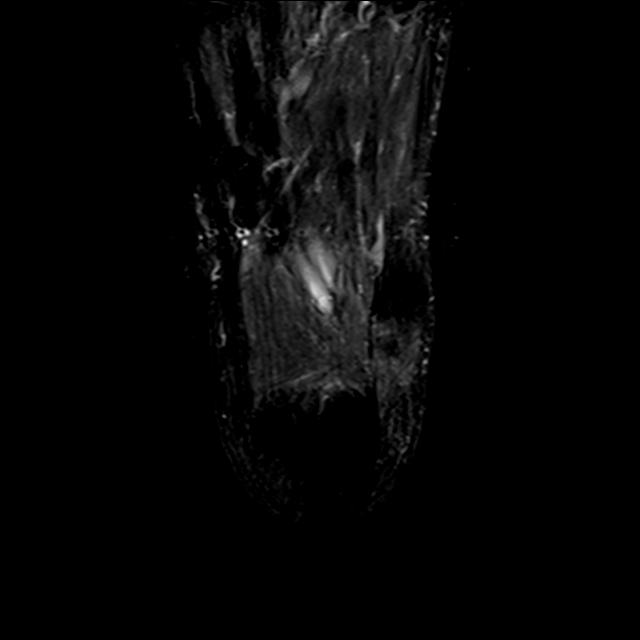
[im 14/28]
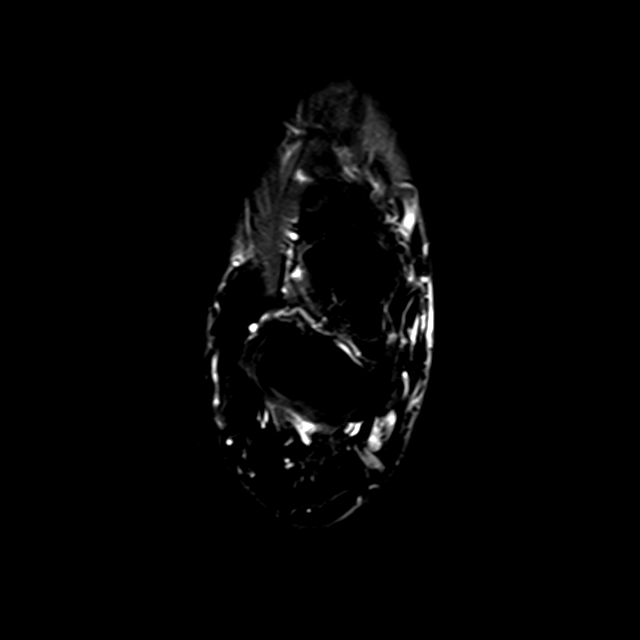
[im 23/28]
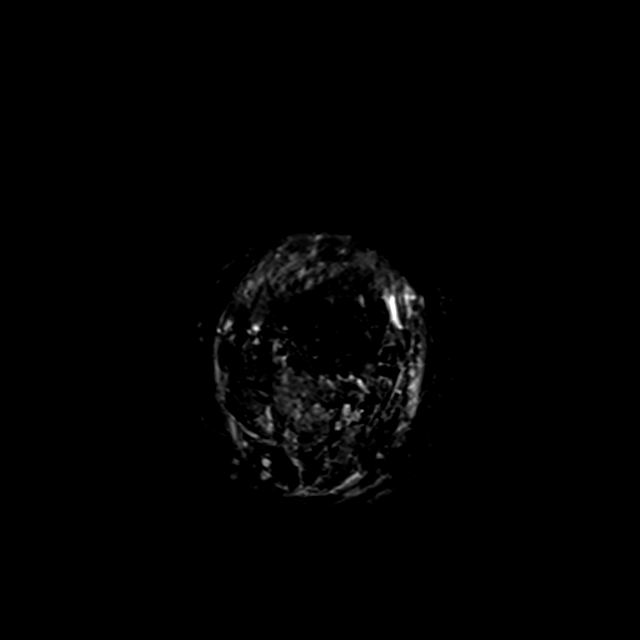

[Series 6: T2 fat-sat · coronal · 3.5mm · 0.35mm/px · 3 of 30 slices shown (2 of 3)]
[im 5/30]
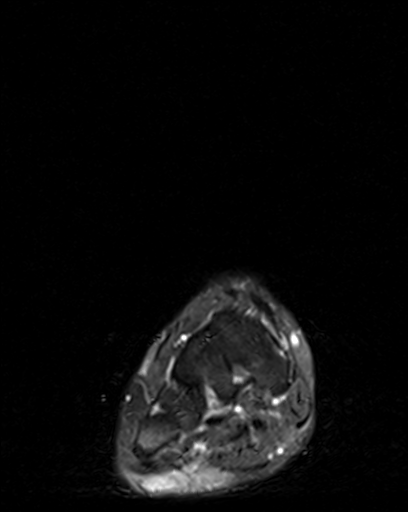
[im 17/30]
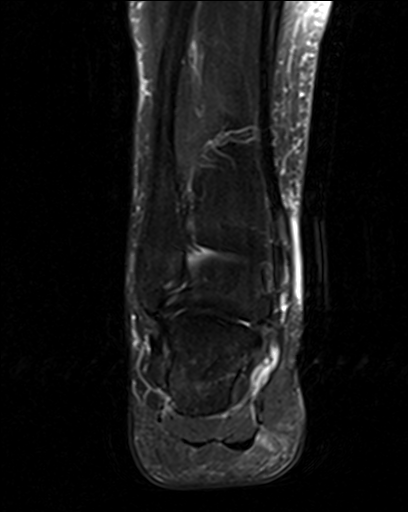
[im 25/30]
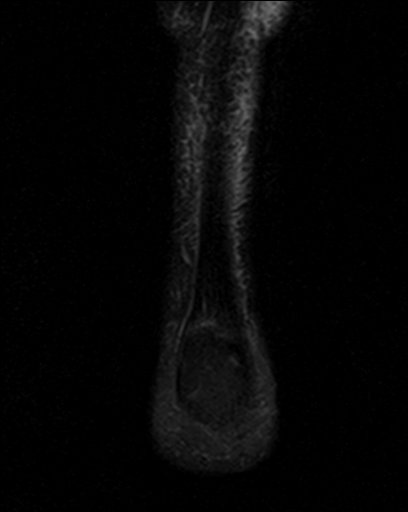

[Series 7: T2 fat-sat · sagittal · 3.0mm · 0.33mm/px · 3 of 21 slices shown (3 of 3)]
[im 1/21]
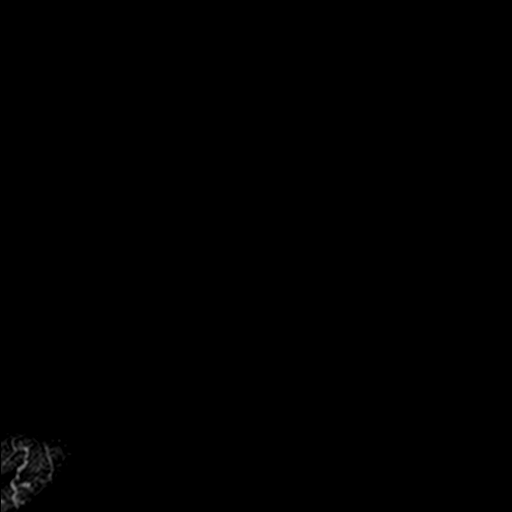
[im 11/21]
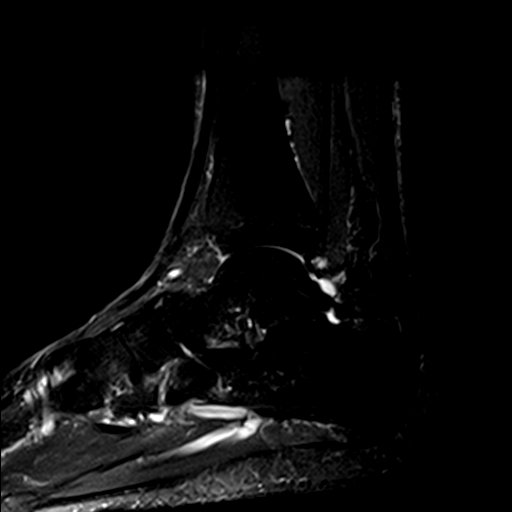
[im 21/21]
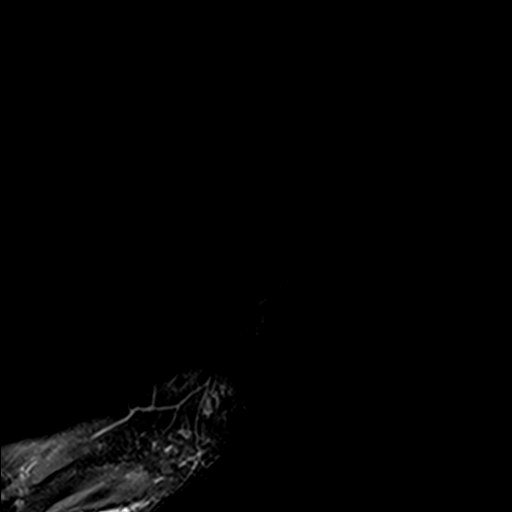

[15 of 40 positions shown; findings below may reference images not displayed]

FINDINGS: Patient motion degrades this exam.

TENDONS

Peroneal: Intact.

Posteromedial: Intact.

Anterior: Intact.

Achilles: Intact.

Plantar Fascia: Appears normal.

LIGAMENTS

Lateral: Intact.

Medial: Intact.

CARTILAGE

Ankle Joint: Appears normal.  No osteochondral lesion.

Subtalar Joints/Sinus Tarsi: Appear normal.

Bones: There is degenerative change at the articulation of the
navicular and medial cuneiform where subchondral edema is seen. No
fracture, stress change or worrisome lesion.

Other: None.
IMPRESSION: Negative for tendon or ligament abnormality.

Moderate appearing osteoarthritis at the articulation of the
navicular and medial cuneiform.

## 2019-05-21 ENCOUNTER — Telehealth: Payer: BC Managed Care – PPO | Admitting: Physician Assistant

## 2019-05-25 ENCOUNTER — Telehealth (INDEPENDENT_AMBULATORY_CARE_PROVIDER_SITE_OTHER): Payer: BC Managed Care – PPO | Admitting: Physician Assistant

## 2019-05-25 VITALS — Ht 63.0 in | Wt 178.0 lb

## 2019-05-25 DIAGNOSIS — Z6831 Body mass index (BMI) 31.0-31.9, adult: Secondary | ICD-10-CM

## 2019-05-25 DIAGNOSIS — F419 Anxiety disorder, unspecified: Secondary | ICD-10-CM | POA: Diagnosis not present

## 2019-05-25 DIAGNOSIS — E6609 Other obesity due to excess calories: Secondary | ICD-10-CM | POA: Diagnosis not present

## 2019-05-25 MED ORDER — CLONAZEPAM 1 MG PO TABS: ORAL_TABLET | ORAL | 5 refills | Status: DC

## 2019-05-25 NOTE — Progress Notes (Signed)
Patient wasn't able to start Saxenda. Had issues with insurance. Still trying to work with a program to get medication, but hasn't been successful at this time. She does have 06/05/2019 nutrition appt.  PHQ9-GAD7 completed.

## 2019-05-25 NOTE — Progress Notes (Signed)
Patient ID: Shirley Vasquez, female   DOB: December 12, 1965, 53 y.o.   MRN: [id number] .Marland KitchenVirtual Visit via Video Note  I connected with Natascha Calvillo on 05/25/19 at  1:20 PM EDT by a video enabled telemedicine application and verified that I am speaking with the correct person using two identifiers.  Location: Patient: home Provider: clinic   I discussed the limitations of evaluation and management by telemedicine and the availability of in person appointments. The patient expressed understanding and agreed to proceed.  History of Present Illness: Pt is a 53 yo obese female with paraesophageal/hiatal hernia who calls in to follow up on weight. We are trying to work on her weight to help put less pressure on her hernia. She continues to have problems with digestion. She sees GI and has had one procedure. We attempted to get saxenda approved. She is working through an Multimedia programmer. She plateued on phentermine. Her weight has remained stable but no weight loss. She is walking a few times a week. She has an appt with a nutritionist next week.   Overall anxiety is doing well. She does need refills.   .. Active Ambulatory Problems    Diagnosis Date Noted  . GERD (gastroesophageal reflux disease) 03/15/2012  . Panic attacks 03/15/2012  . Anxiety 03/15/2012  . Hyperlipidemia 06/12/2012  . Asthma 10/18/2012  . Carpal tunnel syndrome 10/03/2013  . Greater trochanteric bursitis of both hips 04/01/2014  . Low back pain 07/31/2014  . Major depressive disorder, recurrent episode, moderate ([place]) 01/06/2015  . Stress headache 01/06/2015  . Morton's neuroma, right 3/4, left 2/3  03/13/2015  . Right cervical radiculopathy 12/25/2015  . Impaired cognition 01/29/2016  . 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 Bursitis, trochanteric 08/07/2014  . Dysfunction of eustachian tube 08/12/2015  . Elevated LDL cholesterol level 05/03/2016  . Vitamin D insufficiency 05/04/2016  . Low iron stores 11/09/2016   . IDA (iron deficiency anemia) 11/09/2016  . Word finding difficulty 12/22/2016  . Decreased ROM of neck 02/09/2017  . Neck pain 02/09/2017  . Allergy to multiple antibiotics 04/22/2017  . Multiple head injury 07/31/2017  . Memory changes 07/31/2017  . Family history of dementia 07/31/2017  . Memory loss 08/01/2017  . Vitamin D deficiency 08/17/2017  . Hiatal hernia 10/19/2017  . Internal hemorrhoids 10/19/2017  . Diverticulosis 10/19/2017  . Vulvodynia 10/26/2017  . Urge incontinence 10/26/2017  . Paraesophageal hernia 01/11/2018  . Incisional hernia, without obstruction or gangrene 01/11/2018  . Chronic left ear pain 11/08/2018  . Class 1 obesity due to excess calories without serious comorbidity with body mass index (BMI) of 31.0 to 31.9 in adult 12/27/2018  . Uterine fibroid 12/27/2018  . Kidney stone 12/27/2018  . Hypersensitivity reaction at injection site 02/09/2019   Resolved Ambulatory Problems    Diagnosis Date Noted  . Neck muscle spasm 05/03/2013  . Poison ivy dermatitis 05/03/2013  . Greater trochanteric bursitis of right hip 07/01/2014  . Carpal tunnel syndrome, right 03/13/2015  . Klebsiella cystitis 04/22/2017   Past Medical History:  Diagnosis Date  . Anemia   . Fibroid   . Mental disorder   . Vaginal Pap smear, abnormal    Reviewed med, allergy, problem list.   Observations/Objective: No acute distress. Normal mood and appearance.  No labored breathing.   .. Today's Vitals   05/25/19 1147  Weight: 178 lb (80.7 kg)  Height: 5\' 3"  (1.6 m)   Body mass index is 31.53 kg/m. .. Depression screen [facility] 2/9  05/25/2019 03/21/2019 11/06/2018 04/18/2018 10/19/2017  Decreased Interest 0 0 0 0 0  Down, Depressed, Hopeless 0 0 0 0 0  PHQ - 2 Score 0 0 0 0 0  Altered sleeping - 0 - 0 0  Tired, decreased energy - 0 - 0 0  Change in appetite - 0 - 0 0  Feeling bad or failure about yourself  - 0 - 0 0  Trouble concentrating - 0 - 0 0  Moving slowly or  fidgety/restless - 0 - 0 0  Suicidal thoughts - 0 - 0 0  PHQ-9 Score - 0 - 0 0  Difficult doing work/chores - Not difficult at all - Not difficult at all -   .. GAD 7 : Generalized Anxiety Score 05/25/2019 03/21/2019 11/06/2018 04/18/2018  Nervous, Anxious, on Edge 1 0 0 0  Control/stop worrying 0 0 0 0  Worry too much - different things 0 0 0 0  Trouble relaxing 0 0 0 0  Restless 0 0 0 0  Easily annoyed or irritable 0 0 0 0  Afraid - awful might happen 0 0 0 0  Total GAD 7 Score 1 0 0 0  Anxiety Difficulty Not difficult at all Not difficult at all Not difficult at all Not difficult at all       Assessment and Plan: Marland KitchenMarland KitchenAimee was seen today for obesity.  Diagnoses and all orders for this visit:  Class 1 obesity due to excess calories without serious comorbidity with body mass index (BMI) of 31.0 to 31.9 in adult  Anxiety -     clonazePAM (KLONOPIN) 1 MG tablet; TAKE 1 TABLET BY MOUTH TWICE DAILY AS NEEDED  continue to work on getting saxenda approved. Work with nutritionist. Exercise 30 minutes a few times a week.   Refilled medications. Continue on daily prozac/buspar and as needed klonapin.   Follow up in 6 months.    Follow Up Instructions:    I discussed the assessment and treatment plan with the patient. The patient was provided an opportunity to ask questions and all were answered. The patient agreed with the plan and demonstrated an understanding of the instructions.   The patient was advised to call back or seek an in-person evaluation if the symptoms worsen or if the condition fails to improve as anticipated.    Iran Planas, PA-C

## 2019-06-01 ENCOUNTER — Other Ambulatory Visit: Payer: Self-pay | Admitting: Physician Assistant

## 2019-06-01 DIAGNOSIS — E6609 Other obesity due to excess calories: Secondary | ICD-10-CM

## 2019-06-01 DIAGNOSIS — Z6831 Body mass index (BMI) 31.0-31.9, adult: Secondary | ICD-10-CM

## 2019-06-05 ENCOUNTER — Encounter: Payer: Self-pay | Admitting: Registered"

## 2019-06-05 ENCOUNTER — Other Ambulatory Visit: Payer: Self-pay

## 2019-06-05 ENCOUNTER — Encounter: Payer: BC Managed Care – PPO | Attending: Physician Assistant | Admitting: Registered"

## 2019-06-05 DIAGNOSIS — Z713 Dietary counseling and surveillance: Secondary | ICD-10-CM

## 2019-06-05 NOTE — Patient Instructions (Addendum)
Consider looking into vagal tone for relaxation Consider eating balanced meals and snacks, especially try to get protein in the morning. Continue with plan to start back going to [facility]. General recommendations are 3-5 times per week for 30-45 min each time for a minimum of 150 min per week. If desire to do more, consider adding in resistance training and stretching.

## 2019-06-05 NOTE — Progress Notes (Signed)
Medical Nutrition Therapy:  Appt start time: 1500 end time:  1610.  Assessment:  Primary concerns today: Patient has difficulty with eating d/t esophageal spasms, is concerned that her weight is contributing to risk of causing tears in esophagus/damage to fundoplication surgery site. Pt states she feels lost with food. Pt also mentioned she has fatty liver at end of visit.   Weight history: Pt states sas never taken diet pills or diets and did not have an issue until in her 9's after second child. Pt reports at 61 weeks US showed too much amniotic fluid, had a difficult pregnancy and since then "weight has been really tough.' Pt also reports that she had multiple surgeries after that pregnancy.  Physical activity: Pt states she and her husband were going 4-5x/week at [facility] aerobic classes or equipment. After surgery has had restrictions.  Sleep: 10:30pm - 6:30 am. Pt states her sleep has improved since meeting with a counselor for anxiety & depression.   Body Composition Scale Date 06/05/19  Total Body Fat % 38.5  Visceral Fat 11  Fat-Free Mass % 61.4   Total Body Water % 45.2   Muscle-Mass lbs 29.4  Body Fat Displacement          Torso  lbs 43.8         Left Leg  lbs 8.7         Right Leg  lbs 8.7         Left Arm  lbs 4.3         Right Arm   lbs 4.3   Relevant problem/surgeries: GERD, cholecystectomy, head injury 2017 fell d/t wet floor at restaurant (mostly recovered per patient), Nissen fundoplication [id number]  Preferred Learning Style:   Visual   MEDICATIONS: reviewed   DIETARY INTAKE:  24-hr recall:  B ( AM): raisin bran, ff milk OR plan grits Snk ( AM): fruit cup in coconut water OR [place] yogurt OR sugar free jell-o OR multi-grain crackers with thins thin spread of PB L ( PM): low carb wraps, board's head less sodium chicken 3 thin slices, 1/2 slice cheese, lettuce, tomato, honey mustard and 7 or 8 tortilla chips OR Chili with [place], lower sodium white beans OR tuna (without  mayo) with crackers OR just salad, ginger dressing, without protein. Snk ( PM): same as above D ( PM): vegetable angel hair pasta, [place], tomato sauce (slides down easy, doesn't exacerbate esophageal spasms)  Snk ( PM): same as above Beverages: water, decaf tea  Usual physical activity: ADLs   Progress Towards Goal(s):  New goals.   Nutritional Diagnosis:  NI-5.7.1 Inadequate protein intake As related to minimal protein intake for breakfast & lunch.  As evidenced by dietary recall.    Intervention:  Nutrition Education. Discussed balanced eating role of carbohydrates in the diet. Discussed sources of protein. Discussed recommended amount of vegetable intake.  Handouts given during visit include:  MyPlate  Body Comp read out  Barriers to learning/adherence to lifestyle change: none  Demonstrated degree of understanding via:  Teach Back   Monitoring/Evaluation:  Dietary intake, exercise, and body weight in 4 week(s).

## 2019-06-06 DIAGNOSIS — Z8719 Personal history of other diseases of the digestive system: Secondary | ICD-10-CM | POA: Diagnosis not present

## 2019-06-06 DIAGNOSIS — Z9889 Other specified postprocedural states: Secondary | ICD-10-CM | POA: Diagnosis not present

## 2019-06-06 DIAGNOSIS — K219 Gastro-esophageal reflux disease without esophagitis: Secondary | ICD-10-CM | POA: Diagnosis not present

## 2019-06-14 ENCOUNTER — Other Ambulatory Visit: Payer: Self-pay | Admitting: Physician Assistant

## 2019-06-14 DIAGNOSIS — F331 Major depressive disorder, recurrent, moderate: Secondary | ICD-10-CM

## 2019-06-14 DIAGNOSIS — R4789 Other speech disturbances: Secondary | ICD-10-CM

## 2019-06-14 DIAGNOSIS — F419 Anxiety disorder, unspecified: Secondary | ICD-10-CM

## 2019-07-05 ENCOUNTER — Encounter: Payer: Self-pay | Admitting: Physician Assistant

## 2019-07-05 ENCOUNTER — Ambulatory Visit (INDEPENDENT_AMBULATORY_CARE_PROVIDER_SITE_OTHER): Payer: BC Managed Care – PPO | Admitting: Physician Assistant

## 2019-07-05 VITALS — BP 128/78 | Temp 98.6°F | Wt 179.0 lb

## 2019-07-05 DIAGNOSIS — R05 Cough: Secondary | ICD-10-CM

## 2019-07-05 DIAGNOSIS — J0141 Acute recurrent pansinusitis: Secondary | ICD-10-CM | POA: Diagnosis not present

## 2019-07-05 DIAGNOSIS — R059 Cough, unspecified: Secondary | ICD-10-CM

## 2019-07-05 MED ORDER — ALBUTEROL SULFATE HFA 108 (90 BASE) MCG/ACT IN AERS: 1.0000 | INHALATION_SPRAY | Freq: Four times a day (QID) | RESPIRATORY_TRACT | 0 refills | Status: DC | PRN

## 2019-07-05 MED ORDER — CEFDINIR 300 MG PO CAPS: 300.0000 mg | ORAL_CAPSULE | Freq: Two times a day (BID) | ORAL | 0 refills | Status: DC

## 2019-07-05 NOTE — Progress Notes (Signed)
Patient ID: Shirley Vasquez, female   DOB: [birth year], 53 y.o.   MRN: [id number] .Marland KitchenVirtual Visit via Video Note  I connected with Shirley Vasquez on 07/08/19 at  3:20 PM EDT by a video enabled telemedicine application and verified that I am speaking with the correct person using two identifiers.  Location: Patient: home Provider: clinic   I discussed the limitations of evaluation and management by telemedicine and the availability of in person appointments. The patient expressed understanding and agreed to proceed.  History of Present Illness: Pt is a 53 yo female hx of sinus infections who calls into clinic with 2 weeks of congestion, headache, sinus pressure. She usually has 2-3 sinus infections a year. Her symptoms started with congestion, cough, sneezing. She started her OTC treatment with tylenol cold sinus severe. No significant improvement. She denies any fever, chills, loss of smell or taste, GI symptoms, body aches or fatigue. No other sick contacts. No direct covid contact.   .. Active Ambulatory Problems    Diagnosis Date Noted  . GERD (gastroesophageal reflux disease) 03/15/2012  . Panic attacks 03/15/2012  . Anxiety 03/15/2012  . Hyperlipidemia 06/12/2012  . Asthma 10/18/2012  . Carpal tunnel syndrome 10/03/2013  . Greater trochanteric bursitis of both hips 04/01/2014  . Low back pain 07/31/2014  . Major depressive disorder, recurrent episode, moderate ([place]) 01/06/2015  . Stress headache 01/06/2015  . Morton's neuroma, right 3/4, left 2/3  03/13/2015  . Right cervical radiculopathy 12/25/2015  . Impaired cognition 01/29/2016  . 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 Bursitis, trochanteric 08/07/2014  . Dysfunction of eustachian tube 08/12/2015  . Elevated LDL cholesterol level 05/03/2016  . Vitamin D insufficiency 05/04/2016  . Low iron stores 11/09/2016  . IDA (iron deficiency anemia) 11/09/2016  . Word finding difficulty 12/22/2016  . Decreased ROM of neck  02/09/2017  . Neck pain 02/09/2017  . Allergy to multiple antibiotics 04/22/2017  . Multiple head injury 07/31/2017  . Memory changes 07/31/2017  . Family history of dementia 07/31/2017  . Memory loss 08/01/2017  . Vitamin D deficiency 08/17/2017  . Hiatal hernia 10/19/2017  . Internal hemorrhoids 10/19/2017  . Diverticulosis 10/19/2017  . Vulvodynia 10/26/2017  . Urge incontinence 10/26/2017  . Paraesophageal hernia 01/11/2018  . Incisional hernia, without obstruction or gangrene 01/11/2018  . Chronic left ear pain 11/08/2018  . Class 1 obesity due to excess calories without serious comorbidity with body mass index (BMI) of 31.0 to 31.9 in adult 12/27/2018  . Uterine fibroid 12/27/2018  . Kidney stone 12/27/2018  . Hypersensitivity reaction at injection site 02/09/2019   Resolved Ambulatory Problems    Diagnosis Date Noted  . Neck muscle spasm 05/03/2013  . Poison ivy dermatitis 05/03/2013  . Greater trochanteric bursitis of right hip 07/01/2014  . Carpal tunnel syndrome, right 03/13/2015  . Klebsiella cystitis 04/22/2017   Past Medical History:  Diagnosis Date  . Anemia   . Fibroid   . Mental disorder   . Vaginal Pap smear, abnormal    Reviewed med, allergy, problem list.     Observations/Objective: No acute distress.  Normal breathing and respirations.  Normal appearance and mood.   .. Today's Vitals   07/05/19 1506  BP: 128/78  Temp: 98.6 F (37 C)  TempSrc: Oral  Weight: 179 lb (81.2 kg)   Body mass index is 31.71 kg/m.    Assessment and Plan: Marland KitchenMarland KitchenAimee was seen today for sinus problem.  Diagnoses and all orders for this visit:  Acute recurrent  pansinusitis -     cefdinir (OMNICEF) 300 MG capsule; Take 1 capsule (300 mg total) by mouth 2 (two) times daily.  Cough -     albuterol (VENTOLIN HFA) 108 (90 Base) MCG/ACT inhaler; Inhale 1-2 puffs into the lungs every 6 (six) hours as needed for wheezing or shortness of breath.   Treated for sinus  infection. Continue to use flonase and add omnicef. Per patient she has tolerated this in past despite her allergies. Consider sinus rinses. Albuterol for cough. Follow up as needed.    Follow Up Instructions:    I discussed the assessment and treatment plan with the patient. The patient was provided an opportunity to ask questions and all were answered. The patient agreed with the plan and demonstrated an understanding of the instructions.   The patient was advised to call back or seek an in-person evaluation if the symptoms worsen or if the condition fails to improve as anticipated.    Iran Planas, PA-C

## 2019-07-05 NOTE — Progress Notes (Signed)
Sinus pressure, dry cough, congestion.  Taking Claritin and saline rinse. Feels like nose is extremely dry, nosebleed Sunday and Tuesday. SX for 2 weeks. No fevers

## 2019-07-08 ENCOUNTER — Encounter: Payer: Self-pay | Admitting: Physician Assistant

## 2019-07-10 ENCOUNTER — Ambulatory Visit: Payer: BC Managed Care – PPO | Admitting: Registered"

## 2019-07-16 DIAGNOSIS — J019 Acute sinusitis, unspecified: Secondary | ICD-10-CM | POA: Diagnosis not present

## 2019-07-16 DIAGNOSIS — Z1159 Encounter for screening for other viral diseases: Secondary | ICD-10-CM | POA: Diagnosis not present

## 2019-07-16 DIAGNOSIS — R0981 Nasal congestion: Secondary | ICD-10-CM | POA: Diagnosis not present

## 2019-07-17 ENCOUNTER — Ambulatory Visit: Payer: BC Managed Care – PPO | Admitting: Physician Assistant

## 2019-08-08 ENCOUNTER — Encounter: Payer: BC Managed Care – PPO | Admitting: Physician Assistant

## 2019-08-13 ENCOUNTER — Ambulatory Visit (INDEPENDENT_AMBULATORY_CARE_PROVIDER_SITE_OTHER): Payer: BC Managed Care – PPO | Admitting: Physician Assistant

## 2019-08-13 ENCOUNTER — Other Ambulatory Visit: Payer: Self-pay

## 2019-08-13 ENCOUNTER — Encounter: Payer: Self-pay | Admitting: Physician Assistant

## 2019-08-13 VITALS — BP 139/84 | HR 82 | Ht 63.0 in | Wt 184.0 lb

## 2019-08-13 DIAGNOSIS — Z131 Encounter for screening for diabetes mellitus: Secondary | ICD-10-CM | POA: Diagnosis not present

## 2019-08-13 DIAGNOSIS — Z13220 Encounter for screening for lipoid disorders: Secondary | ICD-10-CM | POA: Diagnosis not present

## 2019-08-13 DIAGNOSIS — F419 Anxiety disorder, unspecified: Secondary | ICD-10-CM

## 2019-08-13 DIAGNOSIS — Z1231 Encounter for screening mammogram for malignant neoplasm of breast: Secondary | ICD-10-CM | POA: Diagnosis not present

## 2019-08-13 DIAGNOSIS — R790 Abnormal level of blood mineral: Secondary | ICD-10-CM

## 2019-08-13 DIAGNOSIS — Z0000 Encounter for general adult medical examination without abnormal findings: Secondary | ICD-10-CM

## 2019-08-13 DIAGNOSIS — B001 Herpesviral vesicular dermatitis: Secondary | ICD-10-CM

## 2019-08-13 DIAGNOSIS — F331 Major depressive disorder, recurrent, moderate: Secondary | ICD-10-CM

## 2019-08-13 DIAGNOSIS — D508 Other iron deficiency anemias: Secondary | ICD-10-CM | POA: Diagnosis not present

## 2019-08-13 DIAGNOSIS — R4789 Other speech disturbances: Secondary | ICD-10-CM | POA: Diagnosis not present

## 2019-08-13 MED ORDER — BUSPIRONE HCL 7.5 MG PO TABS: ORAL_TABLET | ORAL | 1 refills | Status: DC

## 2019-08-13 MED ORDER — VALACYCLOVIR HCL 1 G PO TABS: 2000.0000 mg | ORAL_TABLET | Freq: Two times a day (BID) | ORAL | 0 refills | Status: DC

## 2019-08-13 MED ORDER — FLUOXETINE HCL 20 MG PO TABS: ORAL_TABLET | ORAL | 1 refills | Status: DC

## 2019-08-13 NOTE — Patient Instructions (Signed)
Health Maintenance, Female Adopting a healthy lifestyle and getting preventive care are important in promoting health and wellness. Ask your health care provider about:  The right schedule for you to have regular tests and exams.  Things you can do on your own to prevent diseases and keep yourself healthy. What should I know about diet, weight, and exercise? Eat a healthy diet   Eat a diet that includes plenty of vegetables, fruits, low-fat dairy products, and lean protein.  Do not eat a lot of foods that are high in solid fats, added sugars, or sodium. Maintain a healthy weight Body mass index (BMI) is used to identify weight problems. It estimates body fat based on height and weight. Your health care provider can help determine your BMI and help you achieve or maintain a healthy weight. Get regular exercise Get regular exercise. This is one of the most important things you can do for your health. Most adults should:  Exercise for at least 150 minutes each week. The exercise should increase your heart rate and make you sweat (moderate-intensity exercise).  Do strengthening exercises at least twice a week. This is in addition to the moderate-intensity exercise.  Spend less time sitting. Even light physical activity can be beneficial. Watch cholesterol and blood lipids Have your blood tested for lipids and cholesterol at 54 years of age, then have this test every 5 years. Have your cholesterol levels checked more often if:  Your lipid or cholesterol levels are high.  You are older than 54 years of age.  You are at high risk for heart disease. What should I know about cancer screening? Depending on your health history and family history, you may need to have cancer screening at various ages. This may include screening for:  Breast cancer.  Cervical cancer.  Colorectal cancer.  Skin cancer.  Lung cancer. What should I know about heart disease, diabetes, and high blood  pressure? Blood pressure and heart disease  High blood pressure causes heart disease and increases the risk of stroke. This is more likely to develop in people who have high blood pressure readings, are of African descent, or are overweight.  Have your blood pressure checked: ? Every 3-5 years if you are 18-39 years of age. ? Every year if you are 40 years old or older. Diabetes Have regular diabetes screenings. This checks your fasting blood sugar level. Have the screening done:  Once every three years after age 40 if you are at a normal weight and have a low risk for diabetes.  More often and at a younger age if you are overweight or have a high risk for diabetes. What should I know about preventing infection? Hepatitis B If you have a higher risk for hepatitis B, you should be screened for this virus. Talk with your health care provider to find out if you are at risk for hepatitis B infection. Hepatitis C Testing is recommended for:  Everyone born from 1945 through 1965.  Anyone with known risk factors for hepatitis C. Sexually transmitted infections (STIs)  Get screened for STIs, including gonorrhea and chlamydia, if: ? You are sexually active and are younger than 54 years of age. ? You are older than 54 years of age and your health care provider tells you that you are at risk for this type of infection. ? Your sexual activity has changed since you were last screened, and you are at increased risk for chlamydia or gonorrhea. Ask your health care provider if   you are at risk.  Ask your health care provider about whether you are at high risk for HIV. Your health care provider may recommend a prescription medicine to help prevent HIV infection. If you choose to take medicine to prevent HIV, you should first get tested for HIV. You should then be tested every 3 months for as long as you are taking the medicine. Pregnancy  If you are about to stop having your period (premenopausal) and  you may become pregnant, seek counseling before you get pregnant.  Take 400 to 800 micrograms (mcg) of folic acid every day if you become pregnant.  Ask for birth control (contraception) if you want to prevent pregnancy. Osteoporosis and menopause Osteoporosis is a disease in which the bones lose minerals and strength with aging. This can result in bone fractures. If you are 65 years old or older, or if you are at risk for osteoporosis and fractures, ask your health care provider if you should:  Be screened for bone loss.  Take a calcium or vitamin D supplement to lower your risk of fractures.  Be given hormone replacement therapy (HRT) to treat symptoms of menopause. Follow these instructions at home: Lifestyle  Do not use any products that contain nicotine or tobacco, such as cigarettes, e-cigarettes, and chewing tobacco. If you need help quitting, ask your health care provider.  Do not use street drugs.  Do not share needles.  Ask your health care provider for help if you need support or information about quitting drugs. Alcohol use  Do not drink alcohol if: ? Your health care provider tells you not to drink. ? You are pregnant, may be pregnant, or are planning to become pregnant.  If you drink alcohol: ? Limit how much you use to 0-1 drink a day. ? Limit intake if you are breastfeeding.  Be aware of how much alcohol is in your drink. In the [place] one drink equals one 12 oz bottle of beer (355 mL), one 5 oz glass of wine (148 mL), or one 1 oz glass of hard liquor (44 mL). General instructions  Schedule regular health, dental, and eye exams.  Stay current with your vaccines.  Tell your health care provider if: ? You often feel depressed. ? You have ever been abused or do not feel safe at home. Summary  Adopting a healthy lifestyle and getting preventive care are important in promoting health and wellness.  Follow your health care provider's instructions about healthy  diet, exercising, and getting tested or screened for diseases.  Follow your health care provider's instructions on monitoring your cholesterol and blood pressure. This information is not intended to replace advice given to you by your health care provider. Make sure you discuss any questions you have with your health care provider. Document Released: 04/05/2011 Document Revised: 09/13/2018 Document Reviewed: 09/13/2018 Elsevier Patient Education  2020 Elsevier Inc.  

## 2019-08-13 NOTE — Progress Notes (Signed)
Subjective:     Shirley Vasquez is a 53 y.o. female and is here for a comprehensive physical exam. The patient reports problems - she occasional will get a fever blister. she request treatments for outbreaks. she continues to work with nutritionsist. .    Social History   Socioeconomic History  . Marital status: Married    Spouse name: Not on file  . Number of children: Not on file  . Years of education: Not on file  . Highest education level: Not on file  Occupational History  . Occupation: homemaker  Social Needs  . Financial resource strain: Not on file  . Food insecurity    Worry: Not on file    Inability: Not on file  . Transportation needs    Medical: Not on file    Non-medical: Not on file  Tobacco Use  . Smoking status: Never Smoker  . Smokeless tobacco: Never Used  Substance and Sexual Activity  . Alcohol use: No  . Drug use: No  . Sexual activity: Yes    Partners: Male    Birth control/protection: Surgical  Lifestyle  . Physical activity    Days per week: Not on file    Minutes per session: Not on file  . Stress: Not on file  Relationships  . Social Herbalist on phone: Not on file    Gets together: Not on file    Attends religious service: Not on file    Active member of club or organization: Not on file    Attends meetings of clubs or organizations: Not on file    Relationship status: Not on file  . Intimate partner violence    Fear of current or ex partner: Not on file    Emotionally abused: Not on file    Physically abused: Not on file    Forced sexual activity: Not on file  Other Topics Concern  . Not on file  Social History Narrative  . Not on file   Health Maintenance  Topic Date Due  . MAMMOGRAM  10/28/2018  . PAP SMEAR-Modifier  10/25/2020  . TETANUS/TDAP  04/03/2021  . COLONOSCOPY  10/04/2027  . INFLUENZA VACCINE  Completed  . HIV Screening  Completed    The following portions of the patient's history were reviewed and  updated as appropriate: allergies, current medications, past family history, past medical history, past social history, past surgical history and problem list.  Review of Systems A comprehensive review of systems was negative.   Objective:    BP 139/84   Pulse 82   Ht 5\' 3"  (1.6 m)   Wt 184 lb (83.5 kg)   SpO2 98%   BMI 32.59 kg/m  General appearance: alert, cooperative, appears stated age and moderately obese Head: Normocephalic, without obvious abnormality, atraumatic Eyes: conjunctivae/corneas clear. PERRL, EOM's intact. Fundi benign. Ears: normal TM's and external ear canals both ears Nose: Nares normal. Septum midline. Mucosa normal. No drainage or sinus tenderness. Throat: lips, mucosa, and tongue normal; teeth and gums normal Neck: no adenopathy, no carotid bruit, no JVD, supple, symmetrical, trachea midline and thyroid not enlarged, symmetric, no tenderness/mass/nodules Back: symmetric, no curvature. ROM normal. No CVA tenderness. Lungs: clear to auscultation bilaterally Heart: regular rate and rhythm, S1, S2 normal, no murmur, click, rub or gallop Abdomen: soft, non-tender; bowel sounds normal; no masses,  no organomegaly Extremities: extremities normal, atraumatic, no cyanosis or edema Pulses: 2+ and symmetric Skin: Skin color, texture, turgor normal. No rashes  or lesions Lymph nodes: Cervical, supraclavicular, and axillary nodes normal. Neurologic: Alert and oriented X 3, normal strength and tone. Normal symmetric reflexes. Normal coordination and gait    Assessment:    Healthy female exam.      Plan:    Marland KitchenMarland KitchenAimee was seen today for annual exam.  Diagnoses and all orders for this visit:  Routine physical examination -     Lipid Panel w/reflex Direct LDL -     COMPLETE METABOLIC PANEL WITH GFR -     CBC w/Diff -     Ferritin  Visit for screening mammogram -     MM 3D SCREEN BREAST BILATERAL  Anxiety -     busPIRone (BUSPAR) 7.5 MG tablet; TAKE 1 TABLET(7.5  MG) BY MOUTH TWICE DAILY -     FLUoxetine (PROZAC) 20 MG tablet; Take 3 tablets daily for mood.  Word finding difficulty -     FLUoxetine (PROZAC) 20 MG tablet; Take 3 tablets daily for mood.  Major depressive disorder, recurrent episode, moderate ([facility]) -     FLUoxetine (PROZAC) 20 MG tablet; Take 3 tablets daily for mood.  Screening for diabetes mellitus -     COMPLETE METABOLIC PANEL WITH GFR  Screening for lipid disorders -     Lipid Panel w/reflex Direct LDL  Low iron stores -     CBC w/Diff -     Ferritin  Recurrent cold sores -     valACYclovir (VALTREX) 1000 MG tablet; Take 2 tablets (2,000 mg total) by mouth 2 (two) times daily. For one day for outbreak.   .. Depression screen [facility] 2/9 08/13/2019 05/25/2019 03/21/2019 11/06/2018 04/18/2018  Decreased Interest 0 0 0 0 0  Down, Depressed, Hopeless 0 0 0 0 0  PHQ - 2 Score 0 0 0 0 0  Altered sleeping 0 - 0 - 0  Tired, decreased energy 0 - 0 - 0  Change in appetite 0 - 0 - 0  Feeling bad or failure about yourself  1 - 0 - 0  Trouble concentrating 1 - 0 - 0  Moving slowly or fidgety/restless 0 - 0 - 0  Suicidal thoughts 0 - 0 - 0  PHQ-9 Score 2 - 0 - 0  Difficult doing work/chores Somewhat difficult - Not difficult at all - Not difficult at all   .Marland Kitchen Discussed 150 minutes of exercise a week.  Encouraged vitamin D 1000 units and Calcium 1300mg  or 4 servings of dairy a day.  Fasting labs ordered.  Mammogram ordered.  Pap UTD. Colonoscopy UTD. Flu shot done.  Shingles UTD.   Continue to work on weight loss. Patient is going to nutritionist. Pts insurance will not pay for weight loss medications.   Follow up in 6 months.    See After Visit Summary for Counseling Recommendations

## 2019-08-14 DIAGNOSIS — B001 Herpesviral vesicular dermatitis: Secondary | ICD-10-CM | POA: Insufficient documentation

## 2019-08-14 LAB — LIPID PANEL W/REFLEX DIRECT LDL
Cholesterol: 229 mg/dL — ABNORMAL HIGH (ref <200)
HDL: 57 mg/dL (ref >=50)
LDL Cholesterol (Calc): 150 mg/dL (calc) — ABNORMAL HIGH
Non-HDL Cholesterol (Calc): 172 mg/dL (calc) — ABNORMAL HIGH (ref <130)
Total CHOL/HDL Ratio: 4 (calc) (ref <5.0)
Triglycerides: 102 mg/dL (ref <150)

## 2019-08-14 LAB — COMPLETE METABOLIC PANEL WITH GFR
AG Ratio: 1.8 (calc) (ref 1.0–2.5)
ALT: 25 U/L (ref 6–29)
AST: 16 U/L (ref 10–35)
Albumin: 4 g/dL (ref 3.6–5.1)
Alkaline phosphatase (APISO): 65 U/L (ref 37–153)
BUN: 12 mg/dL (ref 7–25)
CO2: 25 mmol/L (ref 20–32)
Calcium: 9.2 mg/dL (ref 8.6–10.4)
Chloride: 107 mmol/L (ref 98–110)
Creat: 0.78 mg/dL (ref 0.50–1.05)
GFR, Est African American: 101 mL/min/{1.73_m2} (ref >=60)
GFR, Est Non African American: 87 mL/min/{1.73_m2} (ref >=60)
Globulin: 2.2 g/dL (calc) (ref 1.9–3.7)
Glucose, Bld: 95 mg/dL (ref 65–99)
Potassium: 4.4 mmol/L (ref 3.5–5.3)
Sodium: 137 mmol/L (ref 135–146)
Total Bilirubin: 0.5 mg/dL (ref 0.2–1.2)
Total Protein: 6.2 g/dL (ref 6.1–8.1)

## 2019-08-14 LAB — CBC WITH DIFFERENTIAL/PLATELET
Absolute Monocytes: 323 cells/uL (ref 200–950)
Basophils Absolute: 30 cells/uL (ref 0–200)
Basophils Relative: 0.7 %
Eosinophils Absolute: 120 cells/uL (ref 15–500)
Eosinophils Relative: 2.8 %
HCT: 38.9 % (ref 35.0–45.0)
Hemoglobin: 12.9 g/dL (ref 11.7–15.5)
Lymphs Abs: 1488 cells/uL (ref 850–3900)
MCH: 30.9 pg (ref 27.0–33.0)
MCHC: 33.2 g/dL (ref 32.0–36.0)
MCV: 93.1 fL (ref 80.0–100.0)
MPV: 10.8 fL (ref 7.5–12.5)
Monocytes Relative: 7.5 %
Neutro Abs: 2339 cells/uL (ref 1500–7800)
Neutrophils Relative %: 54.4 %
Platelets: 240 10*3/uL (ref 140–400)
RBC: 4.18 10*6/uL (ref 3.80–5.10)
RDW: 12.3 % (ref 11.0–15.0)
Total Lymphocyte: 34.6 %
WBC: 4.3 10*3/uL (ref 3.8–10.8)

## 2019-08-14 LAB — FERRITIN: Ferritin: 125 ng/mL (ref 16–232)

## 2019-08-14 NOTE — Progress Notes (Signed)
Shirley Vasquez,   Cholesterol is better from 1 year ago. 10 year cardiovascular risk is 2.2 percent.  Kidney, liver, glucose look great. No anemia.   Luvenia Starch

## 2019-08-20 DIAGNOSIS — B070 Plantar wart: Secondary | ICD-10-CM | POA: Diagnosis not present

## 2019-08-20 DIAGNOSIS — M216X2 Other acquired deformities of left foot: Secondary | ICD-10-CM | POA: Diagnosis not present

## 2019-09-06 DIAGNOSIS — Z9889 Other specified postprocedural states: Secondary | ICD-10-CM | POA: Diagnosis not present

## 2019-09-06 DIAGNOSIS — R635 Abnormal weight gain: Secondary | ICD-10-CM | POA: Diagnosis not present

## 2019-09-06 DIAGNOSIS — K589 Irritable bowel syndrome without diarrhea: Secondary | ICD-10-CM | POA: Diagnosis not present

## 2019-09-06 DIAGNOSIS — K219 Gastro-esophageal reflux disease without esophagitis: Secondary | ICD-10-CM | POA: Diagnosis not present

## 2019-09-11 DIAGNOSIS — D1721 Benign lipomatous neoplasm of skin and subcutaneous tissue of right arm: Secondary | ICD-10-CM | POA: Diagnosis not present

## 2019-09-11 DIAGNOSIS — Z9109 Other allergy status, other than to drugs and biological substances: Secondary | ICD-10-CM | POA: Diagnosis not present

## 2019-09-11 DIAGNOSIS — D229 Melanocytic nevi, unspecified: Secondary | ICD-10-CM | POA: Diagnosis not present

## 2019-09-11 DIAGNOSIS — L821 Other seborrheic keratosis: Secondary | ICD-10-CM | POA: Diagnosis not present

## 2019-09-12 DIAGNOSIS — H6982 Other specified disorders of Eustachian tube, left ear: Secondary | ICD-10-CM | POA: Diagnosis not present

## 2019-09-21 DIAGNOSIS — K219 Gastro-esophageal reflux disease without esophagitis: Secondary | ICD-10-CM | POA: Diagnosis not present

## 2019-09-21 LAB — HM COLONOSCOPY

## 2019-10-01 DIAGNOSIS — E6609 Other obesity due to excess calories: Secondary | ICD-10-CM | POA: Diagnosis not present

## 2019-10-01 DIAGNOSIS — K219 Gastro-esophageal reflux disease without esophagitis: Secondary | ICD-10-CM | POA: Diagnosis not present

## 2019-10-01 DIAGNOSIS — K589 Irritable bowel syndrome without diarrhea: Secondary | ICD-10-CM | POA: Diagnosis not present

## 2019-10-01 DIAGNOSIS — K449 Diaphragmatic hernia without obstruction or gangrene: Secondary | ICD-10-CM | POA: Diagnosis not present

## 2019-10-03 ENCOUNTER — Encounter: Payer: Self-pay | Admitting: Physician Assistant

## 2019-10-13 DIAGNOSIS — L259 Unspecified contact dermatitis, unspecified cause: Secondary | ICD-10-CM | POA: Diagnosis not present

## 2019-11-06 ENCOUNTER — Ambulatory Visit (INDEPENDENT_AMBULATORY_CARE_PROVIDER_SITE_OTHER): Payer: BC Managed Care – PPO | Admitting: Physician Assistant

## 2019-11-06 ENCOUNTER — Other Ambulatory Visit: Payer: Self-pay

## 2019-11-06 ENCOUNTER — Encounter: Payer: Self-pay | Admitting: Physician Assistant

## 2019-11-06 VITALS — BP 122/64 | HR 84 | Ht 63.0 in | Wt 185.0 lb

## 2019-11-06 DIAGNOSIS — R635 Abnormal weight gain: Secondary | ICD-10-CM | POA: Diagnosis not present

## 2019-11-06 DIAGNOSIS — K645 Perianal venous thrombosis: Secondary | ICD-10-CM

## 2019-11-06 DIAGNOSIS — Z9889 Other specified postprocedural states: Secondary | ICD-10-CM | POA: Diagnosis not present

## 2019-11-06 DIAGNOSIS — E6609 Other obesity due to excess calories: Secondary | ICD-10-CM | POA: Diagnosis not present

## 2019-11-06 DIAGNOSIS — K219 Gastro-esophageal reflux disease without esophagitis: Secondary | ICD-10-CM | POA: Diagnosis not present

## 2019-11-06 NOTE — Progress Notes (Signed)
   Subjective:    Patient ID: Shirley Vasquez, female    DOB: [birth year], 54 y.o.   MRN: [id number]  HPI Pt is a 54 yo obese female with hx of hemorrhoids who presents to the clinic to follow up on excision of thrombosed hemorrhoid about 1 week ago. She was visiting her daughter in [place] and noticed bleeding and pain. Went to ED and excision was done. No problems or concerns. No more bleeding. No pain today. She has never had one that bad. Denies any constipation or firm stools.   .. Active Ambulatory Problems    Diagnosis Date Noted  . GERD (gastroesophageal reflux disease) 03/15/2012  . Panic attacks 03/15/2012  . Anxiety 03/15/2012  . Hyperlipidemia 06/12/2012  . Asthma 10/18/2012  . Carpal tunnel syndrome 10/03/2013  . Greater trochanteric bursitis of both hips 04/01/2014  . Low back pain 07/31/2014  . Major depressive disorder, recurrent episode, moderate ([place]) 01/06/2015  . Stress headache 01/06/2015  . Morton's neuroma, right 3/4, left 2/3  03/13/2015  . Right cervical radiculopathy 12/25/2015  . Impaired cognition 01/29/2016  . 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 Bursitis, trochanteric 08/07/2014  . Dysfunction of eustachian tube 08/12/2015  . Elevated LDL cholesterol level 05/03/2016  . Vitamin D insufficiency 05/04/2016  . Low iron stores 11/09/2016  . IDA (iron deficiency anemia) 11/09/2016  . Word finding difficulty 12/22/2016  . Decreased ROM of neck 02/09/2017  . Neck pain 02/09/2017  . Allergy to multiple antibiotics 04/22/2017  . Multiple head injury 07/31/2017  . Memory changes 07/31/2017  . Family history of dementia 07/31/2017  . Memory loss 08/01/2017  . Vitamin D deficiency 08/17/2017  . Hiatal hernia 10/19/2017  . Internal hemorrhoids 10/19/2017  . Diverticulosis 10/19/2017  . Vulvodynia 10/26/2017  . Urge incontinence 10/26/2017  . Paraesophageal hernia 01/11/2018  . Incisional hernia, without obstruction or gangrene 01/11/2018  . Chronic  left ear pain 11/08/2018  . Class 1 obesity due to excess calories without serious comorbidity with body mass index (BMI) of 31.0 to 31.9 in adult 12/27/2018  . Uterine fibroid 12/27/2018  . Kidney stone 12/27/2018  . Hypersensitivity reaction at injection site 02/09/2019  . Recurrent cold sores 08/14/2019  . Thrombosed external hemorrhoid 11/06/2019   Resolved Ambulatory Problems    Diagnosis Date Noted  . Neck muscle spasm 05/03/2013  . Poison ivy dermatitis 05/03/2013  . Greater trochanteric bursitis of right hip 07/01/2014  . Carpal tunnel syndrome, right 03/13/2015  . Klebsiella cystitis 04/22/2017   Past Medical History:  Diagnosis Date  . Anemia   . Fibroid   . Mental disorder   . Vaginal Pap smear, abnormal     Review of Systems  All other systems reviewed and are negative.      Objective:   Physical Exam Vitals reviewed.  Constitutional:      Appearance: Normal appearance.  Genitourinary:    Comments: No thrombosed hemorrhoid.  Multiple anal skin tags.  Neurological:     Mental Status: She is alert.           Assessment & Plan:  Marland KitchenMarland KitchenAimee was seen today for hemorrhoids.  Diagnoses and all orders for this visit:  Thrombosed external hemorrhoid   Healed well. Reassurance given. Prevention discussed. HO given. Follow up as needed.

## 2019-11-06 NOTE — Patient Instructions (Signed)

## 2019-11-14 DIAGNOSIS — Z1239 Encounter for other screening for malignant neoplasm of breast: Secondary | ICD-10-CM | POA: Diagnosis not present

## 2019-11-14 DIAGNOSIS — Z9189 Other specified personal risk factors, not elsewhere classified: Secondary | ICD-10-CM | POA: Diagnosis not present

## 2019-11-14 DIAGNOSIS — Z803 Family history of malignant neoplasm of breast: Secondary | ICD-10-CM | POA: Diagnosis not present

## 2019-12-04 DIAGNOSIS — Z9622 Myringotomy tube(s) status: Secondary | ICD-10-CM | POA: Diagnosis not present

## 2019-12-04 DIAGNOSIS — H6982 Other specified disorders of Eustachian tube, left ear: Secondary | ICD-10-CM | POA: Diagnosis not present

## 2019-12-11 ENCOUNTER — Other Ambulatory Visit: Payer: Self-pay | Admitting: Physician Assistant

## 2019-12-11 DIAGNOSIS — B001 Herpesviral vesicular dermatitis: Secondary | ICD-10-CM

## 2019-12-12 DIAGNOSIS — N6001 Solitary cyst of right breast: Secondary | ICD-10-CM | POA: Diagnosis not present

## 2019-12-12 DIAGNOSIS — Z9189 Other specified personal risk factors, not elsewhere classified: Secondary | ICD-10-CM | POA: Diagnosis not present

## 2019-12-12 DIAGNOSIS — Z803 Family history of malignant neoplasm of breast: Secondary | ICD-10-CM | POA: Diagnosis not present

## 2019-12-12 DIAGNOSIS — N6002 Solitary cyst of left breast: Secondary | ICD-10-CM | POA: Diagnosis not present

## 2020-01-07 DIAGNOSIS — Z862 Personal history of diseases of the blood and blood-forming organs and certain disorders involving the immune mechanism: Secondary | ICD-10-CM | POA: Diagnosis not present

## 2020-01-07 DIAGNOSIS — Z9889 Other specified postprocedural states: Secondary | ICD-10-CM | POA: Diagnosis not present

## 2020-01-07 DIAGNOSIS — K219 Gastro-esophageal reflux disease without esophagitis: Secondary | ICD-10-CM | POA: Diagnosis not present

## 2020-01-07 DIAGNOSIS — Z8719 Personal history of other diseases of the digestive system: Secondary | ICD-10-CM | POA: Diagnosis not present

## 2020-02-11 ENCOUNTER — Ambulatory Visit: Payer: BC Managed Care – PPO | Admitting: Physician Assistant

## 2020-02-13 DIAGNOSIS — H90A12 Conductive hearing loss, unilateral, left ear with restricted hearing on the contralateral side: Secondary | ICD-10-CM | POA: Diagnosis not present

## 2020-02-14 DIAGNOSIS — H6982 Other specified disorders of Eustachian tube, left ear: Secondary | ICD-10-CM | POA: Diagnosis not present

## 2020-02-14 DIAGNOSIS — H9012 Conductive hearing loss, unilateral, left ear, with unrestricted hearing on the contralateral side: Secondary | ICD-10-CM | POA: Diagnosis not present

## 2020-02-25 ENCOUNTER — Encounter: Payer: Self-pay | Admitting: Physician Assistant

## 2020-02-25 ENCOUNTER — Ambulatory Visit (INDEPENDENT_AMBULATORY_CARE_PROVIDER_SITE_OTHER): Payer: BC Managed Care – PPO | Admitting: Physician Assistant

## 2020-02-25 VITALS — BP 118/57 | HR 98 | Ht 63.0 in | Wt 188.0 lb

## 2020-02-25 DIAGNOSIS — F419 Anxiety disorder, unspecified: Secondary | ICD-10-CM

## 2020-02-25 DIAGNOSIS — R4789 Other speech disturbances: Secondary | ICD-10-CM

## 2020-02-25 DIAGNOSIS — K1379 Other lesions of oral mucosa: Secondary | ICD-10-CM

## 2020-02-25 DIAGNOSIS — L299 Pruritus, unspecified: Secondary | ICD-10-CM

## 2020-02-25 DIAGNOSIS — F331 Major depressive disorder, recurrent, moderate: Secondary | ICD-10-CM | POA: Diagnosis not present

## 2020-02-25 DIAGNOSIS — M629 Disorder of muscle, unspecified: Secondary | ICD-10-CM | POA: Diagnosis not present

## 2020-02-25 DIAGNOSIS — Z1231 Encounter for screening mammogram for malignant neoplasm of breast: Secondary | ICD-10-CM

## 2020-02-25 MED ORDER — FLUOXETINE HCL 20 MG PO TABS: ORAL_TABLET | ORAL | 1 refills | Status: DC

## 2020-02-25 MED ORDER — BUSPIRONE HCL 7.5 MG PO TABS: ORAL_TABLET | ORAL | 1 refills | Status: DC

## 2020-02-25 MED ORDER — CLONAZEPAM 1 MG PO TABS: ORAL_TABLET | ORAL | 5 refills | Status: DC

## 2020-02-25 MED ORDER — AMBULATORY NON FORMULARY MEDICATION: 1 refills | Status: AC

## 2020-02-25 NOTE — Patient Instructions (Addendum)
Winning the War in your mind by Shirley Vasquez  Piriformis Syndrome  Piriformis syndrome is a condition that can cause pain and numbness in your buttocks and down the back of your leg. Piriformis syndrome happens when the small muscle that connects the base of your spine to your hip (piriformis muscle) presses on the nerve that runs down the back of your leg (sciatic nerve). The piriformis muscle helps your hip rotate and helps to bring your leg back and out. It also helps shift your weight to keep you stable while you are walking. The sciatic nerve runs under or through the piriformis muscle. Damage to the piriformis muscle can cause spasms that put pressure on the nerve below. This causes pain and discomfort while sitting and moving. The pain may feel as if it begins in the buttock and spreads (radiates) down your hip and thigh. What are the causes? This condition is caused by pressure on the sciatic nerve from the piriformis muscle. The piriformis muscle can get irritated with overuse, especially if other hip muscles are weak and the piriformis muscle has to do extra work. Piriformis syndrome can also occur after an injury, like a fall onto your buttocks. What increases the risk? You are more likely to develop this condition if you:  Are a woman.  Sit for long periods of time.  Are a cyclist.  Have weak buttocks muscles (gluteal muscles). What are the signs or symptoms? Symptoms of this condition include:  Pain, tingling, or numbness that starts in the buttock and runs down the back of your leg (sciatica).  Pain in the groin or thigh area. Your symptoms may get worse:  The longer you sit.  When you walk, run, or climb stairs.  When straining to have a bowel movement. How is this diagnosed? This condition is diagnosed based on your symptoms, medical history, and physical exam.  During the exam, your health care provider may: ? Move your leg into different positions to check for  pain. ? Press on the muscles of your hip and buttock to see if that increases your symptoms.  You may also have tests, including: ? Imaging tests such as X-rays, MRI, or ultrasound. ? Electromyogram (EMG). This test measures electrical signals sent by your nerves into the muscles. ? Nerve conduction study. This test measures how well electrical signals pass through your nerves. How is this treated? This condition may be treated by:  Stopping all activities that cause pain or make your condition worse.  Applying ice or using heat therapy.  Taking medicines to reduce pain and swelling.  Taking a muscle relaxer (muscle relaxant) to stop muscle spasms.  Doing range-of-motion and strengthening exercises (physical therapy) as told by your health care provider.  Massaging the area.  Having acupuncture.  Getting an injection of medicine in the piriformis muscle. Your health care provider will choose the medicine based on your condition. He or she may inject: ? An anti-inflammatory medicine (steroid) to reduce swelling. ? A numbing medicine (local anesthetic) to block the pain. ? Botulinum toxin. The toxin blocks nerve impulses to specific muscles to reduce muscle tension. In rare cases, you may need surgery to cut the muscle and release pressure on the nerve if other treatments do not work. Follow these instructions at home: Activity  Do not sit for long periods. Get up and walk around every 20 minutes or as often as told by your health care provider. ? When driving long distances, make sure to take  frequent stops to get up and stretch.  Use a cushion when you sit on hard surfaces.  Do exercises as told by your health care provider.  Return to your normal activities as told by your health care provider. Ask your health care provider what activities are safe for you. Managing pain, stiffness, and swelling      If directed, apply heat to the affected area as often as told by your  health care provider. Use the heat source that your health care provider recommends, such as a moist heat pack or a heating pad. ? Place a towel between your skin and the heat source. ? Leave the heat on for 20-30 minutes. ? Remove the heat if your skin turns bright red. This is especially important if you are unable to feel pain, heat, or cold. You may have a greater risk of getting burned.  If directed, put ice on the injured area. ? Put ice in a plastic bag. ? Place a towel between your skin and the bag. ? Leave the ice on for 20 minutes, 2-3 times a day. General instructions  Take over-the-counter and prescription medicines only as told by your health care provider.  Ask your health care provider if the medicine prescribed to you requires you to avoid driving or using heavy machinery.  You may need to take actions to prevent or treat constipation, such as: ? Drink enough fluid to keep your urine pale yellow. ? Take over-the-counter or prescription medicines. ? Eat foods that are high in fiber, such as beans, whole grains, and fresh fruits and vegetables. ? Limit foods that are high in fat and processed sugars, such as fried or sweet foods.  Keep all follow-up visits as told by your health care provider. This is important. How is this prevented?  Do not sit for longer than 20 minutes at a time. When you sit, choose padded surfaces.  Warm up and stretch before being active.  Cool down and stretch after being active.  Give your body time to rest between periods of activity.  Make sure to use equipment that fits you.  Maintain physical fitness, including: ? Strength. ? Flexibility. Contact a health care provider if:  Your pain and stiffness continue or get worse.  Your leg or hip becomes weak.  You have changes in your bowel function or bladder function. Summary  Piriformis syndrome is a condition that can cause pain, tingling, and numbness in your buttocks and down the  back of your leg.  You may try applying heat or ice to relieve the pain.  Do not sit for long periods. Get up and walk around every 20 minutes or as often as told by your health care provider. This information is not intended to replace advice given to you by your health care provider. Make sure you discuss any questions you have with your health care provider. Document Revised: 01/11/2019 Document Reviewed: 05/17/2018 Elsevier Patient Education  [facility] Ask your health care provider which exercises are safe for you. Do exercises exactly as told by your health care provider and adjust them as directed. It is normal to feel mild stretching, pulling, tightness, or discomfort as you do these exercises. Stop right away if you feel sudden pain or your pain gets worse. Do not begin these exercises until told by your health care provider. Stretching and range-of-motion exercises These exercises warm up your muscles and joints and improve the movement and flexibility of your  thighs. These exercises also help to relieve pain, numbness, and tingling. Talk to your health care provider about these restrictions. Knee extension, seated  1. Sit with your left / right heel propped on a chair, a coffee table, or a footstool. Do not have anything under your knee to support it. 2. Allow your leg muscles to relax, letting gravity straighten out your knee (extension). You should feel a stretch behind your left / right knee. 3. If told by your health care provider, deepen the stretch by placing a __________ weight on your thigh, just above your kneecap. 4. Hold this position for __________ seconds. Repeat __________ times. Complete this exercise __________ times a day. Seated stretch This exercise is sometimes called hamstrings and adductors stretch. 1. Sit on the floor with your legs stretched wide. Keep your knees straight during this exercise. 2. Keeping your head and back in a  straight line, bend at your waist to reach for your left foot (position A). You should feel a stretch in your right inner thigh (adductors). 3. Hold this position for __________ seconds. Then slowly return to the upright position. 4. Keeping your head and back in a straight line, bend at your waist to reach forward (position B). You should feel a stretch behind both of your thighs or knees (hamstrings). 5. Hold this position for __________ seconds. Then slowly return to the upright position. 6. Keeping your head and back in a straight line, bend at your waist to reach for your right foot (position C). You should feel a stretch in your left inner thigh (adductors). 7. Hold this position for __________ seconds. Then slowly return to the upright position. Repeat __________ times. Complete this exercise __________ times a day. Hamstrings stretch, supine  1. Lie on your back (supine position). 2. Loop a belt or towel over the ball of your left / right foot. The ball of your foot is on the walking surface, right under your toes. 3. Straighten your left / right knee and slowly pull on the belt or towel to raise your leg. ? Do not let your left / right knee bend while you do this. ? Keep your other leg flat on the floor. ? Raise the left / right leg until you feel a gentle stretch behind your left / right knee or thigh (hamstrings). 4. Hold this position for __________ seconds. 5. Slowly return your leg to the starting position. Repeat __________ times. Complete this exercise __________ times a day. Strengthening exercises These exercises build strength and endurance in your thighs. Endurance is the ability to use your muscles for a long time, even after they get tired. Straight leg raises, prone This exercise strengthens the muscles that move the hips (hip extensors). 1. Lie on your abdomen on a firm surface (prone position). 2. Tense the muscles in your buttocks and lift your left / right leg about  4 inches (10 cm). Keep your knee straight as you lift your leg. If you cannot lift your leg that high without arching your back, place a pillow under your hips. 3. Hold the position for __________ seconds. 4. Slowly lower your leg to the starting position. 5. Allow your muscles to relax completely before you start the next repetition. Repeat __________ times. Complete this exercise __________ times a day. Bridge This exercise strengthens the muscles in your buttocks and the back of your thighs (hip extensors). 1. Lie on your back on a firm surface with your knees bent and your feet flat  on the floor. 2. Tighten your buttocks muscles and lift your bottom off the floor until the trunk of your body is level with your thighs. ? You should feel the muscles working in your buttocks and the back of your thighs. ? Do not arch your back. 3. Hold this position for __________ seconds. 4. Slowly lower your hips to the starting position. 5. Let your buttocks muscles relax completely between repetitions. 6. If told by your health care provider, keep your bottom lifted off the floor while you slowly walk your feet away from you as far as you can control. Hold for __________ seconds, then slowly walk your feet back toward you. Repeat __________ times. Complete this exercise __________ times a day. Lateral walking with band This is an exercise in which you walk sideways (lateral), with tension provided by an exercise band. The exercise strengthens the muscles in your hip (hip abductors). 1. Stand in a long hallway. 2. Wrap a loop of exercise band around your legs, just above your knees. 3. Bend your knees gently and drop your hips down and back so your weight is over your heels. 4. Step to the side to move down the length of the hallway, keeping your toes pointed ahead of you and keeping tension in the band. 5. Repeat, leading with your other leg. Repeat __________ times. Complete this exercise __________  times a day. Single leg stand with reaching This exercise is also called eccentric hamstring stretch. 1. Stand on your left / right foot. Keep your big toe down on the floor and try to keep your arch lifted. 2. Slowly reach down toward the floor as far as you can while keeping your balance. Lowering your thigh under tension is called eccentric stretching. 3. Hold this position for __________ seconds. Repeat __________ times. Complete this exercise __________ times a day. Plank, prone This exercise strengthens muscles in your abdomen and core area. 1. Lie on your abdomen on the floor (prone position),and prop yourself up on your elbows. Your hands should be straight out in front of you, and your elbows should be below your shoulders. Position your feet similar to a push-up position so your toes are on the ground. 2. Tighten your abdominal muscles and lift your body off the floor. ? Do not arch your back. ? Do not hold your breath. 3. Hold this position for __________ seconds. Repeat __________ times. Complete this exercise __________ times a day. This information is not intended to replace advice given to you by your health care provider. Make sure you discuss any questions you have with your health care provider. Document Revised: 01/11/2019 Document Reviewed: 09/18/2018 Elsevier Patient Education  [place].

## 2020-02-25 NOTE — Progress Notes (Signed)
Subjective:    Patient ID: Shirley Vasquez, female    DOB: 1966/05/20, 54 y.o.   MRN: [id number]  HPI  Pt is a 54 yo female with Asthma, GERD, MDD, GAD, panic attacks who presents to the clinic for medication refills. She has a few other concerns today as well.   She has a patch of recurrent itchy skin on her left upper arm. No rash. Itches and then goes away. Not done anything to make better.   She has hx of fever blisters and for the last 2 weeks had painful ulcers in mouth. She used valtrex which has helped a lot. She has 12 pills left.   Bilateral posterior thighs feel tight and achy for last 9 weeks. No new medications. No injuries.   Her mood has done really well. She denies any panic attacks. She still has quite a bit of stress in her life but she feels like she is able to manage it.   .. Active Ambulatory Problems    Diagnosis Date Noted  . GERD (gastroesophageal reflux disease) 03/15/2012  . Panic attacks 03/15/2012  . Anxiety 03/15/2012  . Hyperlipidemia 06/12/2012  . Asthma 10/18/2012  . Carpal tunnel syndrome 10/03/2013  . Greater trochanteric bursitis of both hips 04/01/2014  . Low back pain 07/31/2014  . Major depressive disorder, recurrent episode, moderate ([facility]) 01/06/2015  . Stress headache 01/06/2015  . Morton's neuroma, right 3/4, left 2/3  03/13/2015  . Right cervical radiculopathy 12/25/2015  . Impaired cognition 01/29/2016  . 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 Bursitis, trochanteric 08/07/2014  . Dysfunction of eustachian tube 08/12/2015  . Elevated LDL cholesterol level 05/03/2016  . Vitamin D insufficiency 05/04/2016  . Low iron stores 11/09/2016  . IDA (iron deficiency anemia) 11/09/2016  . Word finding difficulty 12/22/2016  . Decreased ROM of neck 02/09/2017  . Neck pain 02/09/2017  . Allergy to multiple antibiotics 04/22/2017  . Multiple head injury 07/31/2017  . Memory changes 07/31/2017  . Family history of dementia 07/31/2017  .  Memory loss 08/01/2017  . Vitamin D deficiency 08/17/2017  . Hiatal hernia 10/19/2017  . Internal hemorrhoids 10/19/2017  . Diverticulosis 10/19/2017  . Vulvodynia 10/26/2017  . Urge incontinence 10/26/2017  . Paraesophageal hernia 01/11/2018  . Incisional hernia, without obstruction or gangrene 01/11/2018  . Chronic left ear pain 11/08/2018  . Class 1 obesity due to excess calories without serious comorbidity with body mass index (BMI) of 31.0 to 31.9 in adult 12/27/2018  . Uterine fibroid 12/27/2018  . Kidney stone 12/27/2018  . Hypersensitivity reaction at injection site 02/09/2019  . Recurrent cold sores 08/14/2019  . Thrombosed external hemorrhoid 11/06/2019  . Hamstring tightness of both lower extremities 02/25/2020  . Itching 02/25/2020  . Mouth sores 02/25/2020   Resolved Ambulatory Problems    Diagnosis Date Noted  . Neck muscle spasm 05/03/2013  . Poison ivy dermatitis 05/03/2013  . Greater trochanteric bursitis of right hip 07/01/2014  . Carpal tunnel syndrome, right 03/13/2015  . Klebsiella cystitis 04/22/2017   Past Medical History:  Diagnosis Date  . Anemia   . Fibroid   . Mental disorder   . Vaginal Pap smear, abnormal       Review of Systems See HPI.     Objective:   Physical Exam Vitals reviewed.  Constitutional:      Appearance: Normal appearance.  HENT:     Head: Normocephalic.     Right Ear: Tympanic membrane normal.  Left Ear: Tympanic membrane normal.     Mouth/Throat:     Comments: Small flesh colored papules lower buccal mucosa.  Cardiovascular:     Rate and Rhythm: Normal rate and regular rhythm.     Pulses: Normal pulses.     Heart sounds: Normal heart sounds.  Pulmonary:     Effort: Pulmonary effort is normal.     Breath sounds: Normal breath sounds.  Musculoskeletal:     Comments: Bilateral decreased ROM of flexion of legs. Tight hamstrings.   Skin:    Comments: No rash. Some erythema noted on upper lateral arm appears from  scratching.   Neurological:     General: No focal deficit present.     Mental Status: She is alert and oriented to person, place, and time.  Psychiatric:        Mood and Affect: Mood normal.      .. Depression screen [facility] 2/9 02/25/2020 08/13/2019 05/25/2019 03/21/2019 11/06/2018  Decreased Interest 1 0 0 0 0  Down, Depressed, Hopeless 0 0 0 0 0  PHQ - 2 Score 1 0 0 0 0  Altered sleeping 0 0 - 0 -  Tired, decreased energy 2 0 - 0 -  Change in appetite 2 0 - 0 -  Feeling bad or failure about yourself  0 1 - 0 -  Trouble concentrating 0 1 - 0 -  Moving slowly or fidgety/restless 0 0 - 0 -  Suicidal thoughts 0 0 - 0 -  PHQ-9 Score 5 2 - 0 -  Difficult doing work/chores Somewhat difficult Somewhat difficult - Not difficult at all -  Some recent data might be hidden   .Marland Kitchen GAD 7 : Generalized Anxiety Score 02/25/2020 08/13/2019 05/25/2019 03/21/2019  Nervous, Anxious, on Edge 1 1 1  0  Control/stop worrying 0 0 0 0  Worry too much - different things 1 0 0 0  Trouble relaxing 0 0 0 0  Restless 0 0 0 0  Easily annoyed or irritable 0 0 0 0  Afraid - awful might happen 1 0 0 0  Total GAD 7 Score 3 1 1  0  Anxiety Difficulty Somewhat difficult Somewhat difficult Not difficult at all Not difficult at all         Assessment & Plan:  Marland KitchenMarland KitchenAimee was seen today for follow-up.  Diagnoses and all orders for this visit:  Major depressive disorder, recurrent episode, moderate ([facility]) -     Discontinue: FLUoxetine (PROZAC) 20 MG tablet; Take 3 tablets daily for mood. -     FLUoxetine (PROZAC) 20 MG tablet; Take 3 tablets daily for mood.  Anxiety -     clonazePAM (KLONOPIN) 1 MG tablet; TAKE 1 TABLET BY MOUTH TWICE DAILY AS NEEDED -     Discontinue: busPIRone (BUSPAR) 7.5 MG tablet; TAKE 1 TABLET(7.5 MG) BY MOUTH TWICE DAILY -     Discontinue: FLUoxetine (PROZAC) 20 MG tablet; Take 3 tablets daily for mood. -     busPIRone (BUSPAR) 7.5 MG tablet; TAKE 1 TABLET(7.5 MG) BY MOUTH TWICE DAILY -      FLUoxetine (PROZAC) 20 MG tablet; Take 3 tablets daily for mood.  Word finding difficulty -     Discontinue: FLUoxetine (PROZAC) 20 MG tablet; Take 3 tablets daily for mood. -     FLUoxetine (PROZAC) 20 MG tablet; Take 3 tablets daily for mood.  Hamstring tightness of both lower extremities  Itching  Mouth sores -     AMBULATORY NON FORMULARY MEDICATION; Swish  and spit 59mL up to three times a day as needed for mouth sores.  .1 Part viscous lidocaine 2% .1 Part Maalox .1 Part diphenhydramine 12.5 mg per 5 ml elixir  Visit for screening mammogram   PHQ-9 and GAD-7 normal range.  Refilled medication.   Given exercises for hamstrings and piriformis for tightness. Consider massage.   Itching as needed topical steroid if that does not help then follow up.   Magic mouthwash given for mouth sores as needed. If ulcers get worse consider valtrex suppression since that is helping the exacerbation.   Follow up in 6 months or sooner if needed.

## 2020-05-28 DIAGNOSIS — Z9189 Other specified personal risk factors, not elsewhere classified: Secondary | ICD-10-CM | POA: Diagnosis not present

## 2020-05-28 DIAGNOSIS — Z803 Family history of malignant neoplasm of breast: Secondary | ICD-10-CM | POA: Diagnosis not present

## 2020-05-28 DIAGNOSIS — Z1239 Encounter for other screening for malignant neoplasm of breast: Secondary | ICD-10-CM | POA: Diagnosis not present

## 2020-07-16 DIAGNOSIS — F411 Generalized anxiety disorder: Secondary | ICD-10-CM | POA: Diagnosis not present

## 2020-07-21 ENCOUNTER — Ambulatory Visit: Payer: BC Managed Care – PPO | Admitting: Physician Assistant

## 2020-07-23 DIAGNOSIS — Z1231 Encounter for screening mammogram for malignant neoplasm of breast: Secondary | ICD-10-CM | POA: Diagnosis not present

## 2020-07-23 DIAGNOSIS — Z803 Family history of malignant neoplasm of breast: Secondary | ICD-10-CM | POA: Diagnosis not present

## 2020-07-25 LAB — HM MAMMOGRAPHY

## 2020-08-13 DIAGNOSIS — F411 Generalized anxiety disorder: Secondary | ICD-10-CM | POA: Diagnosis not present

## 2020-08-18 DIAGNOSIS — H6982 Other specified disorders of Eustachian tube, left ear: Secondary | ICD-10-CM | POA: Diagnosis not present

## 2020-08-19 ENCOUNTER — Encounter: Payer: Self-pay | Admitting: Physician Assistant

## 2020-08-19 ENCOUNTER — Other Ambulatory Visit: Payer: Self-pay

## 2020-08-19 ENCOUNTER — Ambulatory Visit (INDEPENDENT_AMBULATORY_CARE_PROVIDER_SITE_OTHER): Payer: BC Managed Care – PPO | Admitting: Physician Assistant

## 2020-08-19 VITALS — BP 140/75 | HR 98 | Ht 63.0 in | Wt 189.0 lb

## 2020-08-19 DIAGNOSIS — Z131 Encounter for screening for diabetes mellitus: Secondary | ICD-10-CM

## 2020-08-19 DIAGNOSIS — F419 Anxiety disorder, unspecified: Secondary | ICD-10-CM | POA: Diagnosis not present

## 2020-08-19 DIAGNOSIS — Z13220 Encounter for screening for lipoid disorders: Secondary | ICD-10-CM

## 2020-08-19 DIAGNOSIS — Z6833 Body mass index (BMI) 33.0-33.9, adult: Secondary | ICD-10-CM

## 2020-08-19 DIAGNOSIS — F331 Major depressive disorder, recurrent, moderate: Secondary | ICD-10-CM

## 2020-08-19 DIAGNOSIS — R4789 Other speech disturbances: Secondary | ICD-10-CM

## 2020-08-19 DIAGNOSIS — K449 Diaphragmatic hernia without obstruction or gangrene: Secondary | ICD-10-CM | POA: Diagnosis not present

## 2020-08-19 DIAGNOSIS — B001 Herpesviral vesicular dermatitis: Secondary | ICD-10-CM

## 2020-08-19 DIAGNOSIS — Z1159 Encounter for screening for other viral diseases: Secondary | ICD-10-CM | POA: Diagnosis not present

## 2020-08-19 DIAGNOSIS — E6609 Other obesity due to excess calories: Secondary | ICD-10-CM

## 2020-08-19 DIAGNOSIS — E669 Obesity, unspecified: Secondary | ICD-10-CM | POA: Diagnosis not present

## 2020-08-19 DIAGNOSIS — R059 Cough, unspecified: Secondary | ICD-10-CM | POA: Diagnosis not present

## 2020-08-19 DIAGNOSIS — K2100 Gastro-esophageal reflux disease with esophagitis, without bleeding: Secondary | ICD-10-CM | POA: Diagnosis not present

## 2020-08-19 DIAGNOSIS — K3184 Gastroparesis: Secondary | ICD-10-CM | POA: Diagnosis not present

## 2020-08-19 DIAGNOSIS — K219 Gastro-esophageal reflux disease without esophagitis: Secondary | ICD-10-CM | POA: Diagnosis not present

## 2020-08-19 MED ORDER — VALACYCLOVIR HCL 1 G PO TABS: 2000.0000 mg | ORAL_TABLET | Freq: Two times a day (BID) | ORAL | 1 refills | Status: DC

## 2020-08-19 MED ORDER — BUSPIRONE HCL 7.5 MG PO TABS: ORAL_TABLET | ORAL | 1 refills | Status: AC

## 2020-08-19 MED ORDER — FLUOXETINE HCL 20 MG PO TABS: ORAL_TABLET | ORAL | 1 refills | Status: DC

## 2020-08-19 MED ORDER — CLONAZEPAM 1 MG PO TABS: ORAL_TABLET | ORAL | 5 refills | Status: DC

## 2020-08-19 MED ORDER — ALBUTEROL SULFATE HFA 108 (90 BASE) MCG/ACT IN AERS: 1.0000 | INHALATION_SPRAY | Freq: Four times a day (QID) | RESPIRATORY_TRACT | 1 refills | Status: AC | PRN

## 2020-08-19 NOTE — Progress Notes (Signed)
Subjective:    Patient ID: Shirley Vasquez, female    DOB: May 31, 1966, 54 y.o.   MRN: [id number]  HPI  Patient is a 54 year old obese female with hiatal hernia, GERD, anxiety, depression who presents to the clinic for medication refill.  Patient saw her surgeon [facility] surgical center this morning regarding her hiatal hernia.  She continues to have a lot of digestion issues and upper abdominal fullness as well as nausea.  With her counselor weekly and that helps tremendously with her stress level.  They have moved away from there had mood parents.  She does report that this has eliminated some of her stressors.   She needs refills and labs today.    .. Active Ambulatory Problems    Diagnosis Date Noted  . GERD (gastroesophageal reflux disease) 03/15/2012  . Panic attacks 03/15/2012  . Anxiety 03/15/2012  . Hyperlipidemia 06/12/2012  . Asthma 10/18/2012  . Carpal tunnel syndrome 10/03/2013  . Greater trochanteric bursitis of both hips 04/01/2014  . Low back pain 07/31/2014  . Major depressive disorder, recurrent episode, moderate ([place]) 01/06/2015  . Stress headache 01/06/2015  . Morton's neuroma, right 3/4, left 2/3  03/13/2015  . Right cervical radiculopathy 12/25/2015  . Impaired cognition 01/29/2016  . 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 Bursitis, trochanteric 08/07/2014  . Dysfunction of eustachian tube 08/12/2015  . Elevated LDL cholesterol level 05/03/2016  . Vitamin D insufficiency 05/04/2016  . Low iron stores 11/09/2016  . IDA (iron deficiency anemia) 11/09/2016  . Word finding difficulty 12/22/2016  . Decreased ROM of neck 02/09/2017  . Neck pain 02/09/2017  . Allergy to multiple antibiotics 04/22/2017  . Multiple head injury 07/31/2017  . Memory changes 07/31/2017  . Family history of dementia 07/31/2017  . Memory loss 08/01/2017  . Vitamin D deficiency 08/17/2017  . Hiatal hernia 10/19/2017  . Internal hemorrhoids 10/19/2017  . Diverticulosis  10/19/2017  . Vulvodynia 10/26/2017  . Urge incontinence 10/26/2017  . Paraesophageal hernia 01/11/2018  . Incisional hernia, without obstruction or gangrene 01/11/2018  . Chronic left ear pain 11/08/2018  . Class 1 obesity due to excess calories with serious comorbidity and body mass index (BMI) of 33.0 to 33.9 in adult 12/27/2018  . Uterine fibroid 12/27/2018  . Kidney stone 12/27/2018  . Hypersensitivity reaction at injection site 02/09/2019  . Recurrent cold sores 08/14/2019  . Thrombosed external hemorrhoid 11/06/2019  . Hamstring tightness of both lower extremities 02/25/2020  . Itching 02/25/2020  . Mouth sores 02/25/2020   Resolved Ambulatory Problems    Diagnosis Date Noted  . Neck muscle spasm 05/03/2013  . Poison ivy dermatitis 05/03/2013  . Greater trochanteric bursitis of right hip 07/01/2014  . Carpal tunnel syndrome, right 03/13/2015  . Klebsiella cystitis 04/22/2017   Past Medical History:  Diagnosis Date  . Anemia   . Fibroid   . Mental disorder   . Vaginal Pap smear, abnormal        Review of Systems  All other systems reviewed and are negative.      Objective:   Physical Exam Vitals reviewed.  Constitutional:      Appearance: Normal appearance. She is obese.  HENT:     Head: Normocephalic.  Cardiovascular:     Rate and Rhythm: Normal rate and regular rhythm.     Pulses: Normal pulses.  Pulmonary:     Effort: Pulmonary effort is normal.     Breath sounds: Normal breath sounds.  Neurological:  General: No focal deficit present.     Mental Status: She is alert and oriented to person, place, and time.  Psychiatric:        Mood and Affect: Mood normal.      .. Depression screen [facility] 2/9 08/19/2020 02/25/2020 08/13/2019 05/25/2019 03/21/2019  Decreased Interest 0 1 0 0 0  Down, Depressed, Hopeless 0 0 0 0 0  PHQ - 2 Score 0 1 0 0 0  Altered sleeping 0 0 0 - 0  Tired, decreased energy 0 2 0 - 0  Change in appetite 0 2 0 - 0  Feeling bad  or failure about yourself  1 0 1 - 0  Trouble concentrating 0 0 1 - 0  Moving slowly or fidgety/restless 0 0 0 - 0  Suicidal thoughts 0 0 0 - 0  PHQ-9 Score 1 5 2  - 0  Difficult doing work/chores - Somewhat difficult Somewhat difficult - Not difficult at all  Some recent data might be hidden   .Marland Kitchen GAD 7 : Generalized Anxiety Score 08/19/2020 02/25/2020 08/13/2019 05/25/2019  Nervous, Anxious, on Edge 1 1 1 1   Control/stop worrying 0 0 0 0  Worry too much - different things 0 1 0 0  Trouble relaxing 0 0 0 0  Restless 0 0 0 0  Easily annoyed or irritable 0 0 0 0  Afraid - awful might happen 0 1 0 0  Total GAD 7 Score 1 3 1 1   Anxiety Difficulty Not difficult at all Somewhat difficult Somewhat difficult Not difficult at all         Assessment & Plan:  Marland KitchenMarland KitchenAimee was seen today for follow-up.  Diagnoses and all orders for this visit:  Gastroesophageal reflux disease with esophagitis, unspecified whether hemorrhage  Anxiety -     busPIRone (BUSPAR) 7.5 MG tablet; TAKE 1 TABLET(7.5 MG) BY MOUTH TWICE DAILY -     FLUoxetine (PROZAC) 20 MG tablet; Take 3 tablets daily for mood. -     clonazePAM (KLONOPIN) 1 MG tablet; TAKE 1 TABLET BY MOUTH TWICE DAILY AS NEEDED -     CBC with Differential/Platelet  Word finding difficulty -     FLUoxetine (PROZAC) 20 MG tablet; Take 3 tablets daily for mood. -     CBC with Differential/Platelet  Major depressive disorder, recurrent episode, moderate (HCC) -     FLUoxetine (PROZAC) 20 MG tablet; Take 3 tablets daily for mood. -     CBC with Differential/Platelet  Cough -     albuterol (VENTOLIN HFA) 108 (90 Base) MCG/ACT inhaler; Inhale 1-2 puffs into the lungs every 6 (six) hours as needed for wheezing or shortness of breath. -     CBC with Differential/Platelet  Recurrent cold sores -     valACYclovir (VALTREX) 1000 MG tablet; Take 2 tablets (2,000 mg total) by mouth 2 (two) times daily. For one day for outbreak.  Screening for lipid  disorders -     Lipid Panel w/reflex Direct LDL  Screening for diabetes mellitus -     COMPLETE METABOLIC PANEL WITH GFR -     CBC with Differential/Platelet  Encounter for hepatitis C screening test for low risk patient -     Hepatitis C Antibody  Hiatal hernia  Class 1 obesity due to excess calories with serious comorbidity and body mass index (BMI) of 33.0 to 33.9 in adult  Other orders -     buPROPion (WELLBUTRIN SR) 100 MG 12 hr tablet; Take 1 tablet (  100 mg total) by mouth 2 (two) times daily. For first week start once a day then increase to twice a day.   Screening labs ordered.  Medication refilled for 6 months.  Continue follow up with GI regarding hernia.   Marland Kitchen.Discussed low carb diet with 1500 calories and 80g of protein.  Exercising at least 150 minutes a week.  [employer] could be a Microbiologist.  She really can't remember the "rash" with wellbutrin. She would like to try SR formula.  Not candidate for phentermine.  Worry about GI side effects of metformin/wegovy/saxenda. topamax with brain fog could be an issue with how she struggled with word finding issues with anxiety.

## 2020-08-19 NOTE — Patient Instructions (Signed)
wellbutrin SR twice a day.

## 2020-08-20 LAB — COMPLETE METABOLIC PANEL WITH GFR
AG Ratio: 2.1 (calc) (ref 1.0–2.5)
ALT: 26 U/L (ref 6–29)
AST: 20 U/L (ref 10–35)
Albumin: 4.6 g/dL (ref 3.6–5.1)
Alkaline phosphatase (APISO): 79 U/L (ref 37–153)
BUN/Creatinine Ratio: 11 (calc) (ref 6–22)
BUN: 12 mg/dL (ref 7–25)
CO2: 25 mmol/L (ref 20–32)
Calcium: 9.6 mg/dL (ref 8.6–10.4)
Chloride: 105 mmol/L (ref 98–110)
Creat: 1.07 mg/dL — ABNORMAL HIGH (ref 0.50–1.05)
GFR, Est African American: 68 mL/min/{1.73_m2} (ref >=60)
GFR, Est Non African American: 59 mL/min/{1.73_m2} — ABNORMAL LOW (ref >=60)
Globulin: 2.2 g/dL (calc) (ref 1.9–3.7)
Glucose, Bld: 93 mg/dL (ref 65–99)
Potassium: 4.1 mmol/L (ref 3.5–5.3)
Sodium: 139 mmol/L (ref 135–146)
Total Bilirubin: 0.4 mg/dL (ref 0.2–1.2)
Total Protein: 6.8 g/dL (ref 6.1–8.1)

## 2020-08-20 LAB — CBC WITH DIFFERENTIAL/PLATELET
Absolute Monocytes: 371 cells/uL (ref 200–950)
Basophils Absolute: 32 cells/uL (ref 0–200)
Basophils Relative: 0.6 %
Eosinophils Absolute: 80 cells/uL (ref 15–500)
Eosinophils Relative: 1.5 %
HCT: 40.8 % (ref 35.0–45.0)
Hemoglobin: 13.4 g/dL (ref 11.7–15.5)
Lymphs Abs: 1632 cells/uL (ref 850–3900)
MCH: 29.8 pg (ref 27.0–33.0)
MCHC: 32.8 g/dL (ref 32.0–36.0)
MCV: 90.9 fL (ref 80.0–100.0)
MPV: 11.4 fL (ref 7.5–12.5)
Monocytes Relative: 7 %
Neutro Abs: 3185 cells/uL (ref 1500–7800)
Neutrophils Relative %: 60.1 %
Platelets: 282 10*3/uL (ref 140–400)
RBC: 4.49 10*6/uL (ref 3.80–5.10)
RDW: 12.4 % (ref 11.0–15.0)
Total Lymphocyte: 30.8 %
WBC: 5.3 10*3/uL (ref 3.8–10.8)

## 2020-08-20 LAB — HEPATITIS C ANTIBODY
Hepatitis C Ab: NONREACTIVE
SIGNAL TO CUT-OFF: 0 (ref <1.00)

## 2020-08-20 LAB — LIPID PANEL W/REFLEX DIRECT LDL
Cholesterol: 232 mg/dL — ABNORMAL HIGH (ref <200)
HDL: 54 mg/dL (ref >=50)
LDL Cholesterol (Calc): 139 mg/dL (calc) — ABNORMAL HIGH
Non-HDL Cholesterol (Calc): 178 mg/dL (calc) — ABNORMAL HIGH (ref <130)
Total CHOL/HDL Ratio: 4.3 (calc) (ref <5.0)
Triglycerides: 241 mg/dL — ABNORMAL HIGH (ref <150)

## 2020-08-20 NOTE — Progress Notes (Signed)
Karey,   LDL is better than last check. 10 year risk is 2.6 percent in 10 years. Which is stable.  Liver and glucose good.  Your kidney function dropped quite a bit. Are you taking any NSAIDs, are you staying hydration, any news supplements or medications? I would like to get renal u/s to further evaluate. I know you moved do you want to do closer to where you live?

## 2020-08-21 ENCOUNTER — Encounter: Payer: Self-pay | Admitting: Physician Assistant

## 2020-08-21 NOTE — Telephone Encounter (Signed)
Medications added to list.

## 2020-08-22 ENCOUNTER — Encounter: Payer: Self-pay | Admitting: Physician Assistant

## 2020-08-22 ENCOUNTER — Other Ambulatory Visit: Payer: Self-pay | Admitting: Physician Assistant

## 2020-08-22 DIAGNOSIS — R944 Abnormal results of kidney function studies: Secondary | ICD-10-CM

## 2020-08-22 DIAGNOSIS — R7989 Other specified abnormal findings of blood chemistry: Secondary | ICD-10-CM

## 2020-08-22 MED ORDER — BUPROPION HCL ER (SR) 100 MG PO TB12: 100.0000 mg | ORAL_TABLET | Freq: Two times a day (BID) | ORAL | 1 refills | Status: DC

## 2020-08-22 NOTE — Progress Notes (Signed)
Order placed externally.

## 2020-08-27 NOTE — Telephone Encounter (Signed)
Patient called and requested order be faxed to a local imaging facility, [facility]. Order faxed to [phone] [facility]

## 2020-09-02 DIAGNOSIS — R7989 Other specified abnormal findings of blood chemistry: Secondary | ICD-10-CM | POA: Diagnosis not present

## 2020-09-02 DIAGNOSIS — R944 Abnormal results of kidney function studies: Secondary | ICD-10-CM | POA: Diagnosis not present

## 2020-09-10 ENCOUNTER — Telehealth: Payer: Self-pay | Admitting: Neurology

## 2020-09-10 DIAGNOSIS — N289 Disorder of kidney and ureter, unspecified: Secondary | ICD-10-CM

## 2020-09-10 DIAGNOSIS — R8290 Unspecified abnormal findings in urine: Secondary | ICD-10-CM

## 2020-09-10 DIAGNOSIS — F411 Generalized anxiety disorder: Secondary | ICD-10-CM | POA: Diagnosis not present

## 2020-09-10 NOTE — Telephone Encounter (Signed)
Patient left vm wanting [name]'s thoughts on her labs after Renal US done. Please advise.   US Renal  Anatomical Region Laterality Modality  Abdomen -- Ultrasound  Impression Performed by [id number] IMPRESSION:   NO ACUTE ABNORMALITY OR SONOGRAPHIC EVIDENCE OF MEDICAL RENAL DISEASE.   Electronically Signed by: Billey Gosling Narrative Performed by [id number] RENAL ULTRASOUND:   COMPARISON: 04/11/2019   FINDINGS:   The right kidney measures 11.5 cm and the left kidney measures 10.6 cm.   Renal echogenicity is normal. No renal stones or hydronephrosis.   The bladder is normal. Bilateral ureteral jets are identified.   Incidentally noted hepatic steatosis. Procedure Note  Jones Bales, MD - 09/02/2020  Formatting of this note might be different from the original.  RENAL ULTRASOUND:   COMPARISON: 04/11/2019   FINDINGS:   The right kidney measures 11.5 cm and the left kidney measures 10.6 cm.   Renal echogenicity is normal. No renal stones or hydronephrosis.   The bladder is normal. Bilateral ureteral jets are identified.   Incidentally noted hepatic steatosis.    IMPRESSION:   NO ACUTE ABNORMALITY OR SONOGRAPHIC EVIDENCE OF MEDICAL RENAL DISEASE.

## 2020-09-11 DIAGNOSIS — K449 Diaphragmatic hernia without obstruction or gangrene: Secondary | ICD-10-CM | POA: Diagnosis not present

## 2020-09-11 DIAGNOSIS — K3184 Gastroparesis: Secondary | ICD-10-CM | POA: Diagnosis not present

## 2020-09-12 NOTE — Telephone Encounter (Signed)
Patient made aware. She is coming into town for another appt Tuesday and will get lab done here. Order placed.

## 2020-09-12 NOTE — Telephone Encounter (Signed)
Very reassuring that renal ultrasound looks great. It has been about 1 month since labs. Lets recheck renal function and see if improved, stayed the seam or worsened. If patient has moved can get at a lab closer to her.

## 2020-09-15 NOTE — Telephone Encounter (Signed)
Patient left vm stating she started noticing a urine odor this weekend. She is wondering if we can add a test for her urine since she is coming for labs tomorrow. Pended. Please sign if agreeable.

## 2020-09-15 NOTE — Telephone Encounter (Signed)
Patient made aware.

## 2020-09-15 NOTE — Telephone Encounter (Signed)
signed

## 2020-09-15 NOTE — Addendum Note (Signed)
Addended byAnnamaria Helling on: 09/15/2020 03:17 PM   Modules accepted: Orders

## 2020-09-15 NOTE — Addendum Note (Signed)
Addended by: Donella Stade on: 09/15/2020 04:16 PM   Modules accepted: Orders

## 2020-09-16 DIAGNOSIS — R8290 Unspecified abnormal findings in urine: Secondary | ICD-10-CM | POA: Diagnosis not present

## 2020-09-16 DIAGNOSIS — E669 Obesity, unspecified: Secondary | ICD-10-CM | POA: Diagnosis not present

## 2020-09-16 DIAGNOSIS — K449 Diaphragmatic hernia without obstruction or gangrene: Secondary | ICD-10-CM | POA: Diagnosis not present

## 2020-09-16 DIAGNOSIS — N289 Disorder of kidney and ureter, unspecified: Secondary | ICD-10-CM | POA: Diagnosis not present

## 2020-09-16 NOTE — Telephone Encounter (Signed)
UA showed no bacteria or blood in urine.

## 2020-09-17 DIAGNOSIS — M79672 Pain in left foot: Secondary | ICD-10-CM | POA: Diagnosis not present

## 2020-09-17 DIAGNOSIS — B070 Plantar wart: Secondary | ICD-10-CM | POA: Diagnosis not present

## 2020-09-17 LAB — URINALYSIS, ROUTINE W REFLEX MICROSCOPIC
Bacteria, UA: NONE SEEN /HPF
Bilirubin Urine: NEGATIVE
Glucose, UA: NEGATIVE
Hgb urine dipstick: NEGATIVE
Hyaline Cast: NONE SEEN /LPF
Ketones, ur: NEGATIVE
Nitrite: NEGATIVE
Protein, ur: NEGATIVE
RBC / HPF: NONE SEEN /HPF (ref 0–2)
Specific Gravity, Urine: 1.009 (ref 1.001–1.03)
Squamous Epithelial / HPF: NONE SEEN /HPF (ref <=5)
WBC, UA: NONE SEEN /HPF (ref 0–5)
pH: 6 (ref 5.0–8.0)

## 2020-09-17 LAB — COMPLETE METABOLIC PANEL WITH GFR
AG Ratio: 1.6 (calc) (ref 1.0–2.5)
ALT: 37 U/L — ABNORMAL HIGH (ref 6–29)
AST: 26 U/L (ref 10–35)
Albumin: 4.1 g/dL (ref 3.6–5.1)
Alkaline phosphatase (APISO): 75 U/L (ref 37–153)
BUN: 13 mg/dL (ref 7–25)
CO2: 28 mmol/L (ref 20–32)
Calcium: 9.2 mg/dL (ref 8.6–10.4)
Chloride: 105 mmol/L (ref 98–110)
Creat: 0.7 mg/dL (ref 0.50–1.05)
GFR, Est African American: 114 mL/min/{1.73_m2} (ref >=60)
GFR, Est Non African American: 98 mL/min/{1.73_m2} (ref >=60)
Globulin: 2.5 g/dL (calc) (ref 1.9–3.7)
Glucose, Bld: 93 mg/dL (ref 65–139)
Potassium: 4.8 mmol/L (ref 3.5–5.3)
Sodium: 140 mmol/L (ref 135–146)
Total Bilirubin: 0.2 mg/dL (ref 0.2–1.2)
Total Protein: 6.6 g/dL (ref 6.1–8.1)

## 2020-09-17 LAB — URINE CULTURE
MICRO NUMBER:: 11317076
SPECIMEN QUALITY:: ADEQUATE

## 2020-09-17 NOTE — Telephone Encounter (Signed)
Shirley Vasquez,   Your kidney function is wonderful again. No concerns. At times viruses, stress, dehydration and perhaps even maybe an OTC medication could have cause the acute kidney function declined. Perfect now though.

## 2020-09-19 NOTE — Telephone Encounter (Signed)
No significant bacteria growth.

## 2020-10-02 DIAGNOSIS — F411 Generalized anxiety disorder: Secondary | ICD-10-CM | POA: Diagnosis not present

## 2020-10-13 DIAGNOSIS — M79671 Pain in right foot: Secondary | ICD-10-CM | POA: Diagnosis not present

## 2020-10-13 DIAGNOSIS — M25571 Pain in right ankle and joints of right foot: Secondary | ICD-10-CM | POA: Diagnosis not present

## 2020-10-30 DIAGNOSIS — M25571 Pain in right ankle and joints of right foot: Secondary | ICD-10-CM | POA: Diagnosis not present

## 2020-11-03 DIAGNOSIS — M67961 Unspecified disorder of synovium and tendon, right lower leg: Secondary | ICD-10-CM | POA: Diagnosis not present

## 2020-11-07 DIAGNOSIS — M25561 Pain in right knee: Secondary | ICD-10-CM | POA: Diagnosis not present

## 2020-11-07 DIAGNOSIS — S8001XA Contusion of right knee, initial encounter: Secondary | ICD-10-CM | POA: Diagnosis not present

## 2020-11-12 DIAGNOSIS — F411 Generalized anxiety disorder: Secondary | ICD-10-CM | POA: Diagnosis not present

## 2020-11-24 DIAGNOSIS — Z03818 Encounter for observation for suspected exposure to other biological agents ruled out: Secondary | ICD-10-CM | POA: Diagnosis not present

## 2020-11-24 DIAGNOSIS — Z20822 Contact with and (suspected) exposure to covid-19: Secondary | ICD-10-CM | POA: Diagnosis not present

## 2020-11-26 ENCOUNTER — Encounter: Payer: Self-pay | Admitting: Physician Assistant

## 2020-11-26 ENCOUNTER — Telehealth (INDEPENDENT_AMBULATORY_CARE_PROVIDER_SITE_OTHER): Payer: BC Managed Care – PPO | Admitting: Physician Assistant

## 2020-11-26 VITALS — BP 138/75 | HR 98 | Temp 97.6°F | Ht 63.0 in | Wt 189.0 lb

## 2020-11-26 DIAGNOSIS — J0140 Acute pansinusitis, unspecified: Secondary | ICD-10-CM

## 2020-11-26 DIAGNOSIS — J4521 Mild intermittent asthma with (acute) exacerbation: Secondary | ICD-10-CM | POA: Diagnosis not present

## 2020-11-26 MED ORDER — AZITHROMYCIN 250 MG PO TABS: ORAL_TABLET | ORAL | 0 refills | Status: AC

## 2020-11-26 MED ORDER — METHYLPREDNISOLONE 4 MG PO TBPK: ORAL_TABLET | ORAL | 0 refills | Status: AC

## 2020-11-26 NOTE — Progress Notes (Signed)
Patient ID: Shirley Vasquez, female   DOB: 1966/09/08, 55 y.o.   MRN: [id number] .Marland KitchenVirtual Visit via Telephone Note  I connected with Shirley Vasquez on 11/26/2020 at  2:00 PM EST by telephone and verified that I am speaking with the correct person using two identifiers.  Location: Patient: home Provider: clinic  .Marland KitchenParticipating in visit:  Patient: Shirley Vasquez Provider: Iran Planas PA-C    I discussed the limitations, risks, security and privacy concerns of performing an evaluation and management service by telephone and the availability of in person appointments. I also discussed with the patient that there may be a patient responsible charge related to this service. The patient expressed understanding and agreed to proceed.   History of Present Illness: Patient is a 55 year old female with history of allergies and asthma who calls into the clinic with URI symptoms that started last Tuesday.  Symptoms seem to start with some left ear irritation.  She immediately started Claritin-D because of her history of allergies.  Her symptoms have progressed to congestion, sinus pressure, bilateral ear pain, nasal congestion, sinus drainage, and now a seal-like cough.  Her Covid test was done 2 days ago and negative.she is fully vaccinated with booster. She has been taking Mucinex D, Flonase, Tylenol with little benefit.   Active Ambulatory Problems    Diagnosis Date Noted  . GERD (gastroesophageal reflux disease) 03/15/2012  . Panic attacks 03/15/2012  . Anxiety 03/15/2012  . Hyperlipidemia 06/12/2012  . Asthma 10/18/2012  . Carpal tunnel syndrome 10/03/2013  . Greater trochanteric bursitis of both hips 04/01/2014  . Low back pain 07/31/2014  . Major depressive disorder, recurrent episode, moderate ([place]) 01/06/2015  . Stress headache 01/06/2015  . Morton's neuroma, right 3/4, left 2/3  03/13/2015  . Right cervical radiculopathy 12/25/2015  . Impaired cognition 01/29/2016  . Depression due to head  injury 02/02/2016  . Bursitis, trochanteric 08/07/2014  . Dysfunction of eustachian tube 08/12/2015  . Elevated LDL cholesterol level 05/03/2016  . Vitamin D insufficiency 05/04/2016  . Low iron stores 11/09/2016  . IDA (iron deficiency anemia) 11/09/2016  . Word finding difficulty 12/22/2016  . Decreased ROM of neck 02/09/2017  . Neck pain 02/09/2017  . Allergy to multiple antibiotics 04/22/2017  . Multiple head injury 07/31/2017  . Memory changes 07/31/2017  . Family history of dementia 07/31/2017  . Memory loss 08/01/2017  . Vitamin D deficiency 08/17/2017  . Hiatal hernia 10/19/2017  . Internal hemorrhoids 10/19/2017  . Diverticulosis 10/19/2017  . Vulvodynia 10/26/2017  . Urge incontinence 10/26/2017  . Paraesophageal hernia 01/11/2018  . Incisional hernia, without obstruction or gangrene 01/11/2018  . Chronic left ear pain 11/08/2018  . Class 1 obesity due to excess calories with serious comorbidity and body mass index (BMI) of 33.0 to 33.9 in adult 12/27/2018  . Uterine fibroid 12/27/2018  . Kidney stone 12/27/2018  . Hypersensitivity reaction at injection site 02/09/2019  . Recurrent cold sores 08/14/2019  . Thrombosed external hemorrhoid 11/06/2019  . Hamstring tightness of both lower extremities 02/25/2020  . Itching 02/25/2020  . Mouth sores 02/25/2020  . Acute non-recurrent pansinusitis 11/28/2020   Resolved Ambulatory Problems    Diagnosis Date Noted  . Neck muscle spasm 05/03/2013  . Poison ivy dermatitis 05/03/2013  . Greater trochanteric bursitis of right hip 07/01/2014  . Carpal tunnel syndrome, right 03/13/2015  . Klebsiella cystitis 04/22/2017   Past Medical History:  Diagnosis Date  . Anemia   . Fibroid   . Mental disorder   . Vaginal  Pap smear, abnormal    REviewed med, allergy, problem list.      Observations/Objective: No acute distress Normal breathing. No cough on telephone. No wheezing.   .. Today's Vitals   11/26/20 1129  BP:  138/75  Pulse: 98  Temp: 97.6 F (36.4 C)  TempSrc: Oral  Weight: 189 lb (85.7 kg)  Height: 5\' 3"  (1.6 m)   Body mass index is 33.48 kg/m.    Assessment and Plan: Marland KitchenMarland KitchenAimee was seen today for cough and nasal congestion.  Diagnoses and all orders for this visit:  Acute non-recurrent pansinusitis -     methylPREDNISolone (MEDROL DOSEPAK) 4 MG TBPK tablet; Take as directed by package insert. -     azithromycin (ZITHROMAX Z-PAK) 250 MG tablet; Take 2 tablets (500 mg) on  Day 1,  followed by 1 tablet (250 mg) once daily on Days 2 through 5.  Mild intermittent asthma with acute exacerbation -     methylPREDNISolone (MEDROL DOSEPAK) 4 MG TBPK tablet; Take as directed by package insert. -     azithromycin (ZITHROMAX Z-PAK) 250 MG tablet; Take 2 tablets (500 mg) on  Day 1,  followed by 1 tablet (250 mg) once daily on Days 2 through 5.  1 week of symptoms. Hx of asthma. covid negative and fully vaccinated. Treated with zpak and medrol dose pack for cough and sinusitis. Start on anti-histamines to prepare for allergy season. Rest and hydrate. Stay on zinc 50mg  daily with vitamin D and calcium.     Follow Up Instructions:    I discussed the assessment and treatment plan with the patient. The patient was provided an opportunity to ask questions and all were answered. The patient agreed with the plan and demonstrated an understanding of the instructions.   The patient was advised to call back or seek an in-person evaluation if the symptoms worsen or if the condition fails to improve as anticipated.  I provided 20 minutes of non-face-to-face time during this encounter.   Iran Planas, PA-C

## 2020-11-26 NOTE — Progress Notes (Unsigned)
Started last week Congestion/post nasal drip Cough Feels like allergy flare Headache last week  Sore throat over week  Taking mucinex, Air nasal saline spray, tylenol for symptoms Taking Claritin - started last Friday (not helping much)

## 2020-11-28 DIAGNOSIS — J0140 Acute pansinusitis, unspecified: Secondary | ICD-10-CM | POA: Insufficient documentation

## 2020-12-10 DIAGNOSIS — F411 Generalized anxiety disorder: Secondary | ICD-10-CM | POA: Diagnosis not present

## 2020-12-12 DIAGNOSIS — S8001XA Contusion of right knee, initial encounter: Secondary | ICD-10-CM | POA: Diagnosis not present

## 2020-12-12 DIAGNOSIS — M25561 Pain in right knee: Secondary | ICD-10-CM | POA: Diagnosis not present

## 2020-12-17 DIAGNOSIS — Z13820 Encounter for screening for osteoporosis: Secondary | ICD-10-CM | POA: Diagnosis not present

## 2020-12-17 DIAGNOSIS — Z803 Family history of malignant neoplasm of breast: Secondary | ICD-10-CM | POA: Diagnosis not present

## 2020-12-17 DIAGNOSIS — Z9189 Other specified personal risk factors, not elsewhere classified: Secondary | ICD-10-CM | POA: Diagnosis not present

## 2020-12-22 DIAGNOSIS — M67961 Unspecified disorder of synovium and tendon, right lower leg: Secondary | ICD-10-CM | POA: Diagnosis not present

## 2020-12-24 DIAGNOSIS — F411 Generalized anxiety disorder: Secondary | ICD-10-CM | POA: Diagnosis not present

## 2021-01-07 ENCOUNTER — Other Ambulatory Visit: Payer: Self-pay | Admitting: Physician Assistant

## 2021-01-07 DIAGNOSIS — F419 Anxiety disorder, unspecified: Secondary | ICD-10-CM

## 2021-01-07 NOTE — Telephone Encounter (Signed)
Written 08/19/2020 #60 with 5 refills Last appt 11/26/2020

## 2021-01-16 DIAGNOSIS — M25561 Pain in right knee: Secondary | ICD-10-CM | POA: Diagnosis not present

## 2021-01-16 DIAGNOSIS — S8001XA Contusion of right knee, initial encounter: Secondary | ICD-10-CM | POA: Diagnosis not present

## 2021-01-21 DIAGNOSIS — M8588 Other specified disorders of bone density and structure, other site: Secondary | ICD-10-CM | POA: Diagnosis not present

## 2021-01-21 DIAGNOSIS — Z13820 Encounter for screening for osteoporosis: Secondary | ICD-10-CM | POA: Diagnosis not present

## 2021-01-30 DIAGNOSIS — F411 Generalized anxiety disorder: Secondary | ICD-10-CM | POA: Diagnosis not present

## 2021-02-03 ENCOUNTER — Other Ambulatory Visit: Payer: Self-pay | Admitting: Neurology

## 2021-02-03 DIAGNOSIS — B001 Herpesviral vesicular dermatitis: Secondary | ICD-10-CM

## 2021-02-03 MED ORDER — VALACYCLOVIR HCL 1 G PO TABS: 2000.0000 mg | ORAL_TABLET | Freq: Two times a day (BID) | ORAL | 0 refills | Status: AC

## 2021-02-18 DIAGNOSIS — F411 Generalized anxiety disorder: Secondary | ICD-10-CM | POA: Diagnosis not present

## 2021-02-25 DIAGNOSIS — Z1239 Encounter for other screening for malignant neoplasm of breast: Secondary | ICD-10-CM | POA: Diagnosis not present

## 2021-02-25 DIAGNOSIS — Z803 Family history of malignant neoplasm of breast: Secondary | ICD-10-CM | POA: Diagnosis not present

## 2021-02-25 DIAGNOSIS — Z9189 Other specified personal risk factors, not elsewhere classified: Secondary | ICD-10-CM | POA: Diagnosis not present

## 2021-04-01 DIAGNOSIS — H6990 Unspecified Eustachian tube disorder, unspecified ear: Secondary | ICD-10-CM | POA: Diagnosis not present

## 2021-04-01 DIAGNOSIS — H9190 Unspecified hearing loss, unspecified ear: Secondary | ICD-10-CM | POA: Diagnosis not present

## 2021-04-01 DIAGNOSIS — H9202 Otalgia, left ear: Secondary | ICD-10-CM | POA: Diagnosis not present

## 2021-04-15 DIAGNOSIS — F411 Generalized anxiety disorder: Secondary | ICD-10-CM | POA: Diagnosis not present

## 2021-04-22 DIAGNOSIS — D485 Neoplasm of uncertain behavior of skin: Secondary | ICD-10-CM | POA: Diagnosis not present

## 2021-05-11 DIAGNOSIS — D485 Neoplasm of uncertain behavior of skin: Secondary | ICD-10-CM | POA: Diagnosis not present

## 2021-05-11 DIAGNOSIS — L570 Actinic keratosis: Secondary | ICD-10-CM | POA: Diagnosis not present

## 2021-05-11 DIAGNOSIS — Z1231 Encounter for screening mammogram for malignant neoplasm of breast: Secondary | ICD-10-CM | POA: Diagnosis not present

## 2021-05-19 DIAGNOSIS — C44729 Squamous cell carcinoma of skin of left lower limb, including hip: Secondary | ICD-10-CM | POA: Diagnosis not present

## 2021-05-20 DIAGNOSIS — F411 Generalized anxiety disorder: Secondary | ICD-10-CM | POA: Diagnosis not present

## 2021-05-22 ENCOUNTER — Other Ambulatory Visit: Payer: Self-pay | Admitting: Neurology

## 2021-05-22 MED ORDER — BUPROPION HCL ER (SR) 100 MG PO TB12: 100.0000 mg | ORAL_TABLET | Freq: Two times a day (BID) | ORAL | 0 refills | Status: AC

## 2021-05-25 DIAGNOSIS — K219 Gastro-esophageal reflux disease without esophagitis: Secondary | ICD-10-CM | POA: Diagnosis not present

## 2021-05-25 DIAGNOSIS — R194 Change in bowel habit: Secondary | ICD-10-CM | POA: Diagnosis not present

## 2021-05-25 DIAGNOSIS — R1110 Vomiting, unspecified: Secondary | ICD-10-CM | POA: Diagnosis not present

## 2021-05-25 DIAGNOSIS — R110 Nausea: Secondary | ICD-10-CM | POA: Diagnosis not present

## 2021-05-26 DIAGNOSIS — F419 Anxiety disorder, unspecified: Secondary | ICD-10-CM | POA: Diagnosis not present

## 2021-05-26 DIAGNOSIS — D0472 Carcinoma in situ of skin of left lower limb, including hip: Secondary | ICD-10-CM | POA: Diagnosis not present

## 2021-05-26 DIAGNOSIS — K219 Gastro-esophageal reflux disease without esophagitis: Secondary | ICD-10-CM | POA: Diagnosis not present

## 2021-05-26 DIAGNOSIS — Z0000 Encounter for general adult medical examination without abnormal findings: Secondary | ICD-10-CM | POA: Diagnosis not present

## 2021-05-26 DIAGNOSIS — Z124 Encounter for screening for malignant neoplasm of cervix: Secondary | ICD-10-CM | POA: Diagnosis not present

## 2021-05-28 DIAGNOSIS — N6315 Unspecified lump in the right breast, overlapping quadrants: Secondary | ICD-10-CM | POA: Diagnosis not present

## 2021-05-28 DIAGNOSIS — R928 Other abnormal and inconclusive findings on diagnostic imaging of breast: Secondary | ICD-10-CM | POA: Diagnosis not present

## 2021-05-28 DIAGNOSIS — N6489 Other specified disorders of breast: Secondary | ICD-10-CM | POA: Diagnosis not present

## 2021-06-15 DIAGNOSIS — N6310 Unspecified lump in the right breast, unspecified quadrant: Secondary | ICD-10-CM | POA: Diagnosis not present

## 2021-06-15 DIAGNOSIS — Z9189 Other specified personal risk factors, not elsewhere classified: Secondary | ICD-10-CM | POA: Diagnosis not present

## 2021-06-15 DIAGNOSIS — Z803 Family history of malignant neoplasm of breast: Secondary | ICD-10-CM | POA: Diagnosis not present

## 2021-06-15 DIAGNOSIS — N6315 Unspecified lump in the right breast, overlapping quadrants: Secondary | ICD-10-CM | POA: Diagnosis not present

## 2021-06-15 DIAGNOSIS — N6031 Fibrosclerosis of right breast: Secondary | ICD-10-CM | POA: Diagnosis not present

## 2021-06-17 DIAGNOSIS — F411 Generalized anxiety disorder: Secondary | ICD-10-CM | POA: Diagnosis not present

## 2021-06-25 DIAGNOSIS — Z124 Encounter for screening for malignant neoplasm of cervix: Secondary | ICD-10-CM | POA: Diagnosis not present

## 2021-06-25 DIAGNOSIS — Z01419 Encounter for gynecological examination (general) (routine) without abnormal findings: Secondary | ICD-10-CM | POA: Diagnosis not present

## 2021-07-01 DIAGNOSIS — M8580 Other specified disorders of bone density and structure, unspecified site: Secondary | ICD-10-CM | POA: Diagnosis not present

## 2021-07-01 DIAGNOSIS — Z1239 Encounter for other screening for malignant neoplasm of breast: Secondary | ICD-10-CM | POA: Diagnosis not present

## 2021-07-01 DIAGNOSIS — Z803 Family history of malignant neoplasm of breast: Secondary | ICD-10-CM | POA: Diagnosis not present

## 2021-07-01 DIAGNOSIS — Z9189 Other specified personal risk factors, not elsewhere classified: Secondary | ICD-10-CM | POA: Diagnosis not present

## 2021-07-16 DIAGNOSIS — Z48815 Encounter for surgical aftercare following surgery on the digestive system: Secondary | ICD-10-CM | POA: Diagnosis not present

## 2021-07-16 DIAGNOSIS — K635 Polyp of colon: Secondary | ICD-10-CM | POA: Diagnosis not present

## 2021-07-16 DIAGNOSIS — B9681 Helicobacter pylori [H. pylori] as the cause of diseases classified elsewhere: Secondary | ICD-10-CM | POA: Diagnosis not present

## 2021-07-16 DIAGNOSIS — R194 Change in bowel habit: Secondary | ICD-10-CM | POA: Diagnosis not present

## 2021-07-16 DIAGNOSIS — K621 Rectal polyp: Secondary | ICD-10-CM | POA: Diagnosis not present

## 2021-07-16 DIAGNOSIS — K2930 Chronic superficial gastritis without bleeding: Secondary | ICD-10-CM | POA: Diagnosis not present

## 2021-07-16 DIAGNOSIS — R1110 Vomiting, unspecified: Secondary | ICD-10-CM | POA: Diagnosis not present

## 2021-07-16 DIAGNOSIS — K219 Gastro-esophageal reflux disease without esophagitis: Secondary | ICD-10-CM | POA: Diagnosis not present

## 2021-07-16 DIAGNOSIS — D128 Benign neoplasm of rectum: Secondary | ICD-10-CM | POA: Diagnosis not present

## 2021-07-16 DIAGNOSIS — R110 Nausea: Secondary | ICD-10-CM | POA: Diagnosis not present

## 2021-07-29 DIAGNOSIS — C44729 Squamous cell carcinoma of skin of left lower limb, including hip: Secondary | ICD-10-CM | POA: Diagnosis not present

## 2021-07-29 DIAGNOSIS — Z85828 Personal history of other malignant neoplasm of skin: Secondary | ICD-10-CM | POA: Diagnosis not present

## 2021-07-29 DIAGNOSIS — L905 Scar conditions and fibrosis of skin: Secondary | ICD-10-CM | POA: Diagnosis not present

## 2021-08-20 DIAGNOSIS — M4184 Other forms of scoliosis, thoracic region: Secondary | ICD-10-CM | POA: Diagnosis not present

## 2021-08-20 DIAGNOSIS — M47814 Spondylosis without myelopathy or radiculopathy, thoracic region: Secondary | ICD-10-CM | POA: Diagnosis not present

## 2021-08-20 DIAGNOSIS — R109 Unspecified abdominal pain: Secondary | ICD-10-CM | POA: Diagnosis not present

## 2021-08-25 DIAGNOSIS — J069 Acute upper respiratory infection, unspecified: Secondary | ICD-10-CM | POA: Diagnosis not present

## 2021-08-29 DIAGNOSIS — J029 Acute pharyngitis, unspecified: Secondary | ICD-10-CM | POA: Diagnosis not present

## 2021-09-24 ENCOUNTER — Other Ambulatory Visit: Payer: Self-pay | Admitting: Physician Assistant

## 2021-09-24 DIAGNOSIS — B9681 Helicobacter pylori [H. pylori] as the cause of diseases classified elsewhere: Secondary | ICD-10-CM | POA: Diagnosis not present

## 2021-09-24 DIAGNOSIS — F331 Major depressive disorder, recurrent, moderate: Secondary | ICD-10-CM

## 2021-09-24 DIAGNOSIS — F419 Anxiety disorder, unspecified: Secondary | ICD-10-CM

## 2021-09-24 DIAGNOSIS — R4789 Other speech disturbances: Secondary | ICD-10-CM

## 2021-09-25 MED ORDER — FLUOXETINE HCL 20 MG PO TABS: ORAL_TABLET | ORAL | 0 refills | Status: AC

## 2021-09-25 NOTE — Telephone Encounter (Signed)
Last written 01/07/2021 #60 with 5 refills Last visit 11/26/2020  Needs visit - can we schedule patient for follow up??

## 2021-09-25 NOTE — Telephone Encounter (Signed)
Pt has been scheduled for f/u with PCP on 10/02/2021.

## 2021-09-25 NOTE — Telephone Encounter (Signed)
Patient scheduled, please advise.  

## 2021-09-25 NOTE — Addendum Note (Signed)
Addended byAnnamaria Helling on: 09/25/2021 02:54 PM   Modules accepted: Orders

## 2021-09-29 ENCOUNTER — Ambulatory Visit: Payer: BC Managed Care – PPO | Admitting: Physician Assistant

## 2021-10-02 ENCOUNTER — Ambulatory Visit: Payer: BC Managed Care – PPO | Admitting: Physician Assistant

## 2021-10-06 ENCOUNTER — Ambulatory Visit: Payer: BC Managed Care – PPO | Admitting: Physician Assistant
